# Patient Record
Sex: Female | Born: 2000 | Race: White | Hispanic: Yes | Marital: Single | Smoking: Current some day smoker
Health system: Southern US, Community
[De-identification: ages and names within clinical notes are randomized; demographics above are authoritative.]

## PROBLEM LIST (undated history)

## (undated) ENCOUNTER — Inpatient Hospital Stay: Payer: Self-pay

## (undated) DIAGNOSIS — F319 Bipolar disorder, unspecified: Secondary | ICD-10-CM

## (undated) HISTORY — PX: APPENDECTOMY: SHX54

## (undated) HISTORY — DX: Bipolar disorder, unspecified: F31.9

---
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
## 2020-01-24 ENCOUNTER — Emergency Department
Admission: EM | Admit: 2020-01-24 | Discharge: 2020-01-25 | Disposition: A | Discharge status: Home / Self Care | Payer: Medicaid Other | Attending: Emergency Medicine | Admitting: Emergency Medicine

## 2020-01-24 ENCOUNTER — Other Ambulatory Visit: Payer: Self-pay

## 2020-01-24 DIAGNOSIS — Z20822 Contact with and (suspected) exposure to covid-19: Secondary | ICD-10-CM | POA: Insufficient documentation

## 2020-01-24 DIAGNOSIS — Z046 Encounter for general psychiatric examination, requested by authority: Secondary | ICD-10-CM | POA: Diagnosis not present

## 2020-01-24 DIAGNOSIS — F1290 Cannabis use, unspecified, uncomplicated: Secondary | ICD-10-CM | POA: Insufficient documentation

## 2020-01-24 DIAGNOSIS — F29 Unspecified psychosis not due to a substance or known physiological condition: Secondary | ICD-10-CM | POA: Insufficient documentation

## 2020-01-24 DIAGNOSIS — F22 Delusional disorders: Secondary | ICD-10-CM | POA: Diagnosis present

## 2020-01-24 LAB — CBC
HCT: 46.1 % — ABNORMAL HIGH (ref 36.0–46.0)
Hemoglobin: 16.2 g/dL — ABNORMAL HIGH (ref 12.0–15.0)
MCH: 31.3 pg (ref 26.0–34.0)
MCHC: 35.1 g/dL (ref 30.0–36.0)
MCV: 89 fL (ref 80.0–100.0)
Platelets: 355 10*3/uL (ref 150–400)
RBC: 5.18 MIL/uL — ABNORMAL HIGH (ref 3.87–5.11)
RDW: 12.5 % (ref 11.5–15.5)
WBC: 11.4 10*3/uL — ABNORMAL HIGH (ref 4.0–10.5)
nRBC: 0 % (ref 0.0–0.2)

## 2020-01-24 LAB — COMPREHENSIVE METABOLIC PANEL
ALT: 28 U/L (ref 0–44)
AST: 27 U/L (ref 15–41)
Albumin: 5.3 g/dL — ABNORMAL HIGH (ref 3.5–5.0)
Alkaline Phosphatase: 78 U/L (ref 38–126)
Anion gap: 14 (ref 5–15)
BUN: 10 mg/dL (ref 6–20)
CO2: 20 mmol/L — ABNORMAL LOW (ref 22–32)
Calcium: 9.5 mg/dL (ref 8.9–10.3)
Chloride: 105 mmol/L (ref 98–111)
Creatinine, Ser: 0.69 mg/dL (ref 0.44–1.00)
GFR calc Af Amer: >60 mL/min (ref >60)
GFR calc non Af Amer: >60 mL/min (ref >60)
Glucose, Bld: 95 mg/dL (ref 70–99)
Potassium: 3.5 mmol/L (ref 3.5–5.1)
Sodium: 139 mmol/L (ref 135–145)
Total Bilirubin: 1.5 mg/dL — ABNORMAL HIGH (ref 0.3–1.2)
Total Protein: 8.5 g/dL — ABNORMAL HIGH (ref 6.5–8.1)

## 2020-01-24 LAB — SALICYLATE LEVEL: Salicylate Lvl: <7 mg/dL — ABNORMAL LOW (ref 7.0–30.0)

## 2020-01-24 LAB — ACETAMINOPHEN LEVEL: Acetaminophen (Tylenol), Serum: <10 ug/mL — ABNORMAL LOW (ref 10–30)

## 2020-01-24 LAB — ETHANOL: Alcohol, Ethyl (B): <10 mg/dL (ref <10)
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
## 2020-01-24 NOTE — ED Notes (Signed)
Patient changed into hospital provided scrubs by this RN and by Ian Malkin NT. Patient's belonging's placed into labeled bag. Patient's belonging's include:  Green pull over jacket, blue shirt, grey pants, pink shorts, grey panties, green bra, black shoes, black socks, green hair tie.

## 2020-01-25 ENCOUNTER — Emergency Department: Payer: Medicaid Other

## 2020-01-25 LAB — RESPIRATORY PANEL BY RT PCR (FLU A&B, COVID)
Influenza A by PCR: NEGATIVE
Influenza B by PCR: NEGATIVE
SARS Coronavirus 2 by RT PCR: NEGATIVE

## 2020-01-25 LAB — URINE DRUG SCREEN, QUALITATIVE (ARMC ONLY)
Amphetamines, Ur Screen: NOT DETECTED
Barbiturates, Ur Screen: NOT DETECTED
Benzodiazepine, Ur Scrn: NOT DETECTED
Cannabinoid 50 Ng, Ur ~~LOC~~: POSITIVE — AB
Cocaine Metabolite,Ur ~~LOC~~: NOT DETECTED
MDMA (Ecstasy)Ur Screen: NOT DETECTED
Methadone Scn, Ur: NOT DETECTED
Opiate, Ur Screen: NOT DETECTED
Phencyclidine (PCP) Ur S: NOT DETECTED
Tricyclic, Ur Screen: NOT DETECTED

## 2020-01-25 LAB — POCT PREGNANCY, URINE: Preg Test, Ur: NEGATIVE

## 2020-01-25 MED ORDER — LORAZEPAM 0.5 MG PO TABS
0.5000 mg | ORAL_TABLET | Freq: Once | ORAL | Status: AC
  Administered 2020-01-25: 0.5 mg via ORAL
  Filled 2020-01-25: qty 1

## 2020-01-25 NOTE — BH Assessment (Signed)
Patient already seen at [facility] and psychiatric inpatient was already determined. [facility]

## 2020-01-25 NOTE — ED Notes (Signed)
Pt ambulatory to toilet and given water

## 2020-01-25 NOTE — ED Notes (Signed)
Patient has been accepted to [facility].  Patient assigned to [facility] Unit Accepting physician is Dr. Betti Cruz.  Call report to [phone].  Representative was [place].   ER Staff is aware of it:  [facility] ER Secretary  Dr. Dolores Frame, ER MD  Danelle Earthly Patient's Nurse

## 2020-01-25 NOTE — ED Notes (Signed)
Pt's belongings sent with [facility] for transport to [facility].

## 2020-01-25 NOTE — ED Notes (Addendum)
Pt verbalized understanding of need to transfer to [facility] for continued care. Pt is NAD at this time. Pt transported from [facility] by [facility] department. RN unable to obtain signature for transfer.

## 2020-01-25 NOTE — ED Notes (Signed)
This RN attempted to contact Pt's Aunt to inform her of pt's transfer. RN was unsuccessful.

## 2020-01-25 NOTE — ED Notes (Addendum)
Pt found in room att reports confusion and not knowing "what's going on" denies confrontation with mother  Pt denies SI/HI but reports hearing voices - "you know them" this writer reports that is unknown and that seems to agitate pt, exasperated sigh and falls back onto bed  Pt IVC'd
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
## 2020-01-27 ENCOUNTER — Ambulatory Visit: Payer: Self-pay

## 2020-02-15 ENCOUNTER — Emergency Department
Admission: EM | Admit: 2020-02-15 | Discharge: 2020-02-16 | Disposition: A | Discharge status: Other Acute Inpatient Hospital | Payer: Medicaid Other | Attending: Emergency Medicine | Admitting: Emergency Medicine

## 2020-02-15 ENCOUNTER — Other Ambulatory Visit: Payer: Self-pay

## 2020-02-15 ENCOUNTER — Encounter: Payer: Self-pay | Admitting: Emergency Medicine

## 2020-02-15 DIAGNOSIS — F419 Anxiety disorder, unspecified: Secondary | ICD-10-CM | POA: Diagnosis not present

## 2020-02-15 DIAGNOSIS — Z20822 Contact with and (suspected) exposure to covid-19: Secondary | ICD-10-CM | POA: Diagnosis not present

## 2020-02-15 DIAGNOSIS — F17200 Nicotine dependence, unspecified, uncomplicated: Secondary | ICD-10-CM | POA: Diagnosis not present

## 2020-02-15 DIAGNOSIS — F32A Depression, unspecified: Secondary | ICD-10-CM

## 2020-02-15 DIAGNOSIS — R45851 Suicidal ideations: Secondary | ICD-10-CM | POA: Insufficient documentation

## 2020-02-15 DIAGNOSIS — F329 Major depressive disorder, single episode, unspecified: Secondary | ICD-10-CM | POA: Diagnosis present

## 2020-02-15 DIAGNOSIS — F259 Schizoaffective disorder, unspecified: Secondary | ICD-10-CM

## 2020-02-15 DIAGNOSIS — F250 Schizoaffective disorder, bipolar type: Secondary | ICD-10-CM

## 2020-02-15 LAB — ACETAMINOPHEN LEVEL: Acetaminophen (Tylenol), Serum: <10 ug/mL — ABNORMAL LOW (ref 10–30)

## 2020-02-15 LAB — CBC
HCT: 46.5 % — ABNORMAL HIGH (ref 36.0–46.0)
Hemoglobin: 15.9 g/dL — ABNORMAL HIGH (ref 12.0–15.0)
MCH: 31.5 pg (ref 26.0–34.0)
MCHC: 34.2 g/dL (ref 30.0–36.0)
MCV: 92.1 fL (ref 80.0–100.0)
Platelets: 316 10*3/uL (ref 150–400)
RBC: 5.05 MIL/uL (ref 3.87–5.11)
RDW: 12.7 % (ref 11.5–15.5)
WBC: 14.3 10*3/uL — ABNORMAL HIGH (ref 4.0–10.5)
nRBC: 0 % (ref 0.0–0.2)

## 2020-02-15 LAB — COMPREHENSIVE METABOLIC PANEL
ALT: 41 U/L (ref 0–44)
AST: 36 U/L (ref 15–41)
Albumin: 4.5 g/dL (ref 3.5–5.0)
Alkaline Phosphatase: 91 U/L (ref 38–126)
Anion gap: 10 (ref 5–15)
BUN: 11 mg/dL (ref 6–20)
CO2: 19 mmol/L — ABNORMAL LOW (ref 22–32)
Calcium: 9.4 mg/dL (ref 8.9–10.3)
Chloride: 109 mmol/L (ref 98–111)
Creatinine, Ser: 0.72 mg/dL (ref 0.44–1.00)
GFR calc Af Amer: >60 mL/min (ref >60)
GFR calc non Af Amer: >60 mL/min (ref >60)
Glucose, Bld: 147 mg/dL — ABNORMAL HIGH (ref 70–99)
Potassium: 4.5 mmol/L (ref 3.5–5.1)
Sodium: 138 mmol/L (ref 135–145)
Total Bilirubin: 2 mg/dL — ABNORMAL HIGH (ref 0.3–1.2)
Total Protein: 8.1 g/dL (ref 6.5–8.1)

## 2020-02-15 LAB — URINE DRUG SCREEN, QUALITATIVE (ARMC ONLY)
Amphetamines, Ur Screen: NOT DETECTED
Barbiturates, Ur Screen: NOT DETECTED
Benzodiazepine, Ur Scrn: POSITIVE — AB
Cannabinoid 50 Ng, Ur ~~LOC~~: POSITIVE — AB
Cocaine Metabolite,Ur ~~LOC~~: NOT DETECTED
MDMA (Ecstasy)Ur Screen: NOT DETECTED
Methadone Scn, Ur: NOT DETECTED
Opiate, Ur Screen: NOT DETECTED
Phencyclidine (PCP) Ur S: NOT DETECTED
Tricyclic, Ur Screen: NOT DETECTED

## 2020-02-15 LAB — SALICYLATE LEVEL: Salicylate Lvl: <7 mg/dL — ABNORMAL LOW (ref 7.0–30.0)

## 2020-02-15 LAB — ETHANOL: Alcohol, Ethyl (B): <10 mg/dL (ref <10)

## 2020-02-15 LAB — SARS CORONAVIRUS 2 BY RT PCR (HOSPITAL ORDER, PERFORMED IN ~~LOC~~ HOSPITAL LAB): SARS Coronavirus 2: NEGATIVE

## 2020-02-15 LAB — POCT PREGNANCY, URINE: Preg Test, Ur: NEGATIVE

## 2020-02-15 MED ORDER — LORAZEPAM 1 MG PO TABS
1.0000 mg | ORAL_TABLET | ORAL | Status: DC | PRN
  Filled 2020-02-15: qty 1

## 2020-02-15 MED ORDER — LORAZEPAM 1 MG PO TABS
1.0000 mg | ORAL_TABLET | Freq: Once | ORAL | Status: AC
  Administered 2020-02-15: 1 mg via ORAL
  Filled 2020-02-15: qty 1
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
## 2020-02-15 NOTE — ED Notes (Signed)
Patient dressed out by this RN and Shanda Bumps, RN in hospital provided scrubs. Pt belongings placed in belongings bag and labeled with pt label and green tag. Pt had with them the following belongings:   1 pair of black tennis shoes  1 pair of gray socks 1 pair of black legging pants  1 black t-shirt 1 pink and blue hair tie 1 white sports bra 1 pair of gray underwear  Pt belongings taken with patient to 25h. Pt calm and cooperative.
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
## 2020-02-15 NOTE — ED Provider Notes (Signed)
-----------------------------------------   11:28 PM on 02/15/2020 -----------------------------------------  Talked in person with Jerilynn Som (TTS) who said the patient will be admitted to behavioral medicine service.   Loleta Rose, MD 02/15/20 2328

## 2020-02-15 NOTE — ED Notes (Signed)
Pt reports coming to the ER for shaking. Pt denies SI/HI at this time

## 2020-02-15 NOTE — ED Notes (Signed)
Patient is drowsy, cooperative and calm. Room darkened for comfort.

## 2020-02-15 NOTE — ED Notes (Signed)
Will defer psychiatric assessment until patient is more coherent. Patient remains alert, restlessness is less, but still unable to give appropriate answers.

## 2020-02-15 NOTE — ED Triage Notes (Signed)
Pt to ED via POV by Uncle for psych eval. Per pt she was brought in because she was "shaking a lot". Pt has very flat affect in triage. Pt admits to SI, pt denies HI. Pt states that she is under a lot of stress. Pt is calm and cooperative at this time. Pt tachycardic at 143, denies ingestion of anything other than her routine medications.

## 2020-02-15 NOTE — ED Notes (Signed)
Pt shaking, restless, moving all around in recliner.  Pt unable to give appropriate answers to questions.

## 2020-02-15 NOTE — ED Notes (Signed)
Report given to Ashton RN

## 2020-02-15 NOTE — BH Assessment (Signed)
Assessment Note  Madison Boyer is an 19 y.o. female who presents to the ER, after she was briefly seen at [facility] Per the patient's uncle ([phone]), today, she was irritable, impulsive and irrational. She was standing in traffic yelling at cars and trying to stop them. This is the first time he has seen her like this. Approximately a month ago, her behaviors began to change. She was reporting HI and paranoid at that time and required inpatient treatment. She was admitted at [facility] (01/25/2020-02/05/2020). Per the uncle the patient was prescribed two medications but he doesn't know what it is and if she's taking it.  Patient lives with her parents and three younger siblings. The uncle is the main contact because they do not speak english and that's why he was the one who brought her to the ER. The family was concerned she was going to get hit by a car or someone was going to hurt her because of the way she was behaving.  During the interview the patient attempted to participate. She was liable and at times had a flight of ideas. She was easily redirectable and able to complete majority of her thoughts. She admits to the use of cannabis and alcohol and denies the use of any other mind-altering substances. Her UDS reflected the same, she's positive for cannabis and BAC was negative.  Diagnosis: Unspecified Psychosis  Past Medical History: History reviewed. No pertinent past medical history.  Past Surgical History:  Procedure Laterality Date  . APPENDECTOMY      Family History:  Family History  Problem Relation Age of Onset  . Healthy Mother   . Healthy Father   . Healthy Maternal Grandmother   . Diabetes Maternal Grandfather   . Healthy Paternal Grandmother   . Healthy Paternal Grandfather     Social History:  reports that she has been smoking. She has never used smokeless tobacco. She reports current alcohol use. She reports current drug use. Drug: Marijuana.  Additional Social  History:  Alcohol / Drug Use Pain Medications: See PTA Prescriptions: See PTA Over the Counter: See PTA History of alcohol / drug use?: No history of alcohol / drug abuse Longest period of sobriety (when/how long): Unable to quantify  CIWA: CIWA-Ar BP: 129/67 Pulse Rate: (!) 137 COWS:    Allergies: No Known Allergies  Home Medications: (Not in a hospital admission)   OB/GYN Status:  No LMP recorded.  General Assessment Data Location of Assessment: [facility] ED TTS Assessment: In system Is this a Tele or Face-to-Face Assessment?: Face-to-Face Is this an Initial Assessment or a Re-assessment for this encounter?: Initial Assessment Patient Accompanied by:: Other(Uncle) Language Other than English: No Living Arrangements: Other (Comment)(Private Home) What gender do you identify as?: Female Marital status: Single Pregnancy Status: No Living Arrangements: Parent, Other relatives Can pt return to current living arrangement?: Yes Admission Status: Involuntary Petitioner: ED Attending Is patient capable of signing voluntary admission?: No(Under IVC) Referral Source: Self/Family/Friend Insurance type: None  Medical Screening Exam [facility] Walk-in ONLY) Medical Exam completed: Yes  Crisis Care Plan Living Arrangements: Parent, Other relatives Legal Guardian: Other:(Self) Name of Psychiatrist: [facility] Name of Therapist: [facility]  Education Status Is patient currently in school?: No Is the patient employed, unemployed or receiving disability?: Unemployed  Risk to self with the past 6 months Suicidal Ideation: No Has patient been a risk to self within the past 6 months prior to admission? : No Suicidal Intent: No Has patient had any suicidal intent within the  past 6 months prior to admission? : No Is patient at risk for suicide?: No Suicidal Plan?: No Has patient had any suicidal plan within the past 6 months prior to admission? : No Access to Means: No What has been your use of  drugs/alcohol within the last 12 months?: Unknown Previous Attempts/Gestures: No How many times?: 0 Other Self Harm Risks: Reports of none Triggers for Past Attempts: None known Intentional Self Injurious Behavior: None Family Suicide History: Unknown Recent stressful life event(s): Other (Comment)(Changes in mood and thought process) Persecutory voices/beliefs?: No Depression: Yes Depression Symptoms: Feeling angry/irritable, Tearfulness, Insomnia, Feeling worthless/self pity Substance abuse history and/or treatment for substance abuse?: No Suicide prevention information given to non-admitted patients: Not applicable  Risk to Others within the past 6 months Homicidal Ideation: No Does patient have any lifetime risk of violence toward others beyond the six months prior to admission? : No Thoughts of Harm to Others: No Current Homicidal Intent: No Current Homicidal Plan: No Access to Homicidal Means: No Identified Victim: Reports of none History of harm to others?: No Assessment of Violence: None Noted Violent Behavior Description: Reports of none Does patient have access to weapons?: No Criminal Charges Pending?: No Does patient have a court date: No Is patient on probation?: No  Psychosis Hallucinations: None noted Delusions: Persecutory, Unspecified  Mental Status Report Appearance/Hygiene: Unremarkable, In scrubs Eye Contact: Good Motor Activity: Freedom of movement, Unremarkable Speech: Soft, Unremarkable, Logical/coherent Level of Consciousness: Alert, Restless Mood: Anxious, Ambivalent, Sad, Pleasant(Tearful) Affect: Anxious, Appropriate to circumstance, Labile Anxiety Level: Moderate Thought Processes: Circumstantial, Flight of Ideas, Relevant Judgement: Partial Orientation: Person, Place, Appropriate for developmental age Obsessive Compulsive Thoughts/Behaviors: Minimal  Cognitive Functioning Concentration: Decreased Memory: Recent Impaired, Remote Intact Is  patient IDD: No Insight: Poor Impulse Control: Poor Appetite: Good Have you had any weight changes? : No Change Sleep: Decreased Total Hours of Sleep: 0 Vegetative Symptoms: None  ADLScreening [facility] Assessment Services) Patient's cognitive ability adequate to safely complete daily activities?: Yes Patient able to express need for assistance with ADLs?: Yes Independently performs ADLs?: Yes (appropriate for developmental age)  Prior Inpatient Therapy Prior Inpatient Therapy: Yes Prior Therapy Dates: 01/2020 Prior Therapy Facilty/Provider(s): [facility] [facility] Reason for Treatment: Psychosis  Prior Outpatient Therapy Prior Outpatient Therapy: Yes Prior Therapy Dates: Current Prior Therapy Facilty/Provider(s): [facility] Reason for Treatment: Psychosis Does patient have an ACCT team?: No Does patient have Intensive In-House Services?  : No Does patient have [facility] services? : No Does patient have P4CC services?: No  ADL Screening (condition at time of admission) Patient's cognitive ability adequate to safely complete daily activities?: Yes Is the patient deaf or have difficulty hearing?: No Does the patient have difficulty seeing, even when wearing glasses/contacts?: No Does the patient have difficulty concentrating, remembering, or making decisions?: No Patient able to express need for assistance with ADLs?: Yes Does the patient have difficulty dressing or bathing?: No Independently performs ADLs?: Yes (appropriate for developmental age) Does the patient have difficulty walking or climbing stairs?: No Weakness of Legs: None Weakness of Arms/Hands: None  Home Assistive Devices/Equipment Home Assistive Devices/Equipment: None  Therapy Consults (therapy consults require a physician order) PT Evaluation Needed: No OT Evalulation Needed: No SLP Evaluation Needed: No Abuse/Neglect Assessment (Assessment to be complete while patient is alone) Abuse/Neglect Assessment Can Be Completed:  Yes Physical Abuse: Denies Verbal Abuse: Denies Sexual Abuse: Denies Exploitation of patient/patient's resources: Denies Self-Neglect: Denies Values / Beliefs Cultural Requests During Hospitalization: None Spiritual Requests During Hospitalization: None  Consults Spiritual Care Consult Needed: No Transition of Care Team Consult Needed: No Advance Directives (For Healthcare) Does Patient Have a Medical Advance Directive?: No Would patient like information on creating a medical advance directive?: No - Patient declined  Child/Adolescent Assessment Running Away Risk: Denies(Patient is an adult)  Disposition:  Disposition Initial Assessment Completed for this Encounter: Yes  On Site Evaluation by:   Reviewed with Physician:    Lilyan Gilford MS, LCAS, [facility], [facility] Therapeutic Triage Specialist 02/15/2020 9:05 PM

## 2020-02-15 NOTE — ED Notes (Addendum)
Patient given ED sandwich tray and ginger ale. Patient is oriented to self, but still cannot stay focus on questions. Patient denies SI/HI.

## 2020-02-15 NOTE — ED Provider Notes (Signed)
[facility] EMERGENCY DEPARTMENT Provider Note   CSN: [id number] Arrival date & time: 02/15/20  1621     History Chief Complaint  Patient presents with  . Psychiatric Evaluation    Madison Boyer is a 19 y.o. female here presenting with depression and suicidal ideation.  Patient states that she just wants to be left alone.  She apparently told her family that she wanted to kill herself.  Her uncle then brought her in for evaluation.  Apparently patient went to [facility] and try to open the car door and jump out of a moving vehicle.  Patient states that she does not remember any of that.  She appears very tearful but states that she cannot remember what she told her parents.   The history is provided by the patient.       History reviewed. No pertinent past medical history.  Patient Active Problem List   Diagnosis Date Noted  . Schizoaffective disorder (HCC) 02/15/2020  . Dysmenorrhea     Past Surgical History:  Procedure Laterality Date  . APPENDECTOMY       OB History    Gravida  0   Para  0   Term  0   Preterm  0   AB  0   Living  0     SAB  0   TAB  0   Ectopic  0   Multiple  0   Live Births  0           Family History  Problem Relation Age of Onset  . Healthy Mother   . Healthy Father   . Healthy Maternal Grandmother   . Diabetes Maternal Grandfather   . Healthy Paternal Grandmother   . Healthy Paternal Grandfather     Social History   Tobacco Use  . Smoking status: Current Some Day Smoker  . Smokeless tobacco: Never Used  Substance Use Topics  . Alcohol use: Yes    Comment: occass  . Drug use: Yes    Types: Marijuana    Home Medications Prior to Admission medications   Medication Sig Start Date End Date Taking? Authorizing Provider  norgestimate-ethinyl estradiol (ORTHO-CYCLEN) 0.25-35 MG-MCG tablet Take 1 tablet by mouth daily. 12/23/19   Gunnar Bulla, CNM    Allergies    Patient has no known  allergies.  Review of Systems   Review of Systems  Psychiatric/Behavioral: Positive for confusion and dysphoric mood.  All other systems reviewed and are negative.   Physical Exam Updated Vital Signs BP 134/78 (BP Location: Left Arm)   Pulse (!) 113   Temp 98.2 F (36.8 C) (Oral)   Resp 14   Ht 5\' 2"  (1.575 m)   Wt 81.6 kg   SpO2 98%   BMI 32.92 kg/m   Physical Exam Vitals and nursing note reviewed.  Constitutional:      Comments: Tearful   HENT:     Head: Normocephalic.     Nose: Nose normal.     Mouth/Throat:     Mouth: Mucous membranes are moist.  Eyes:     Extraocular Movements: Extraocular movements intact.     Pupils: Pupils are equal, round, and reactive to light.  Cardiovascular:     Rate and Rhythm: Normal rate and regular rhythm.     Pulses: Normal pulses.     Heart sounds: Normal heart sounds.  Pulmonary:     Effort: Pulmonary effort is normal.     Breath sounds: Normal  breath sounds.  Abdominal:     General: Abdomen is flat.     Palpations: Abdomen is soft.  Musculoskeletal:        General: Normal range of motion.     Cervical back: Normal range of motion.  Skin:    General: Skin is warm.     Capillary Refill: Capillary refill takes less than 2 seconds.  Neurological:     General: No focal deficit present.  Psychiatric:     Comments: Depressed, tearful      ED Results / Procedures / Treatments   Labs (all labs ordered are listed, but only abnormal results are displayed) Labs Reviewed  COMPREHENSIVE METABOLIC PANEL - Abnormal; Notable for the following components:      Result Value   CO2 19 (*)    Glucose, Bld 147 (*)    Total Bilirubin 2.0 (*)    All other components within normal limits  SALICYLATE LEVEL - Abnormal; Notable for the following components:   Salicylate Lvl <7.0 (*)    All other components within normal limits  ACETAMINOPHEN LEVEL - Abnormal; Notable for the following components:   Acetaminophen (Tylenol), Serum <10 (*)     All other components within normal limits  CBC - Abnormal; Notable for the following components:   WBC 14.3 (*)    Hemoglobin 15.9 (*)    HCT 46.5 (*)    All other components within normal limits  URINE DRUG SCREEN, QUALITATIVE ([facility] ONLY) - Abnormal; Notable for the following components:   Cannabinoid 50 Ng, Ur [place] POSITIVE (*)    Benzodiazepine, Ur Scrn POSITIVE (*)    All other components within normal limits  SARS CORONAVIRUS 2 BY RT PCR (HOSPITAL ORDER, PERFORMED IN [facility])  ETHANOL  POC URINE PREG, ED  POCT PREGNANCY, URINE  POC URINE PREG, ED    EKG None  Radiology No results found.  Procedures Procedures (including critical care time)  Medications Ordered in ED Medications  LORazepam (ATIVAN) tablet 1 mg (has no administration in time range)  LORazepam (ATIVAN) tablet 1 mg (1 mg Oral Given 02/15/20 1722)    ED Course  I have reviewed the triage vital signs and the nursing notes.  Pertinent labs & imaging results that were available during my care of the patient were reviewed by me and considered in my medical decision making (see chart for details).    MDM Rules/Calculators/A&P                      Madison Boyer is a 19 y.o. female here presenting with some forgetfulness and suicidal ideation.  Patient apparently tried to jump out of a moving car but she did not remember that.  She is very tearful.  She is here voluntarily.  She is also tachycardic when she is very anxious.  We will give some Ativan and check labs and UDS.   11:45 PM UDS positive for marijuana and benzos.  However she received Ativan in the ED.  Her heart rate is down to 110 after getting Ativan.  Psych plans to admit.  The patient has been placed in psychiatric observation due to the need to provide a safe environment for the patient while obtaining psychiatric consultation and evaluation, as well as ongoing medical and medication management to treat the patient's condition.   The patient has been placed under full IVC at this time.    Final Clinical Impression(s) / ED Diagnoses Final diagnoses:  Depression,  unspecified depression type    Rx / DC Orders ED Discharge Orders    None       Drenda Freeze, MD 02/15/20 2345

## 2020-02-16 ENCOUNTER — Encounter: Payer: Self-pay | Admitting: Psychiatric/Mental Health

## 2020-02-16 ENCOUNTER — Inpatient Hospital Stay
Admit: 2020-02-16 | Discharge: 2020-02-25 | DRG: 885 | Disposition: A | Discharge status: Home / Self Care | Payer: Medicaid Other | Source: Intra-hospital | Attending: Psychiatry | Admitting: Psychiatry

## 2020-02-16 DIAGNOSIS — F19959 Other psychoactive substance use, unspecified with psychoactive substance-induced psychotic disorder, unspecified: Secondary | ICD-10-CM

## 2020-02-16 DIAGNOSIS — F259 Schizoaffective disorder, unspecified: Principal | ICD-10-CM | POA: Diagnosis present

## 2020-02-16 DIAGNOSIS — Z20822 Contact with and (suspected) exposure to covid-19: Secondary | ICD-10-CM | POA: Diagnosis present

## 2020-02-16 DIAGNOSIS — F1210 Cannabis abuse, uncomplicated: Secondary | ICD-10-CM | POA: Diagnosis present

## 2020-02-16 DIAGNOSIS — F1010 Alcohol abuse, uncomplicated: Secondary | ICD-10-CM

## 2020-02-16 DIAGNOSIS — N946 Dysmenorrhea, unspecified: Secondary | ICD-10-CM | POA: Diagnosis present

## 2020-02-16 DIAGNOSIS — Z9119 Patient's noncompliance with other medical treatment and regimen: Secondary | ICD-10-CM | POA: Diagnosis not present

## 2020-02-16 DIAGNOSIS — F17200 Nicotine dependence, unspecified, uncomplicated: Secondary | ICD-10-CM | POA: Diagnosis present

## 2020-02-16 MED ORDER — LORAZEPAM 2 MG PO TABS
2.0000 mg | ORAL_TABLET | Freq: Once | ORAL | Status: AC
  Administered 2020-02-16: 2 mg via ORAL
  Filled 2020-02-16: qty 1

## 2020-02-16 MED ORDER — BENZTROPINE MESYLATE 1 MG PO TABS: 2.0000 mg | ORAL_TABLET | Freq: Two times a day (BID) | ORAL | Status: DC | PRN

## 2020-02-16 MED ORDER — BENZTROPINE MESYLATE 1 MG/ML IJ SOLN
2.0000 mg | Freq: Two times a day (BID) | INTRAMUSCULAR | Status: DC | PRN
  Filled 2020-02-16: qty 2

## 2020-02-16 MED ORDER — ACETAMINOPHEN 325 MG PO TABS
650.0000 mg | ORAL_TABLET | Freq: Four times a day (QID) | ORAL | Status: DC | PRN
  Administered 2020-02-18 – 2020-02-19 (×2): 650 mg via ORAL
  Filled 2020-02-16 (×2): qty 2

## 2020-02-16 MED ORDER — ALUM & MAG HYDROXIDE-SIMETH 200-200-20 MG/5ML PO SUSP: 30.0000 mL | ORAL | Status: DC | PRN

## 2020-02-16 MED ORDER — MAGNESIUM HYDROXIDE 400 MG/5ML PO SUSP: 30.0000 mL | Freq: Every day | ORAL | Status: DC | PRN

## 2020-02-16 MED ORDER — RISPERIDONE 1 MG PO TABS
2.0000 mg | ORAL_TABLET | Freq: Two times a day (BID) | ORAL | Status: DC
  Administered 2020-02-16 – 2020-02-19 (×7): 2 mg via ORAL
  Filled 2020-02-16 (×7): qty 2

## 2020-02-16 MED ORDER — HYDROXYZINE HCL 25 MG PO TABS
25.0000 mg | ORAL_TABLET | Freq: Three times a day (TID) | ORAL | Status: DC | PRN
  Administered 2020-02-16 – 2020-02-23 (×13): 25 mg via ORAL
  Filled 2020-02-16 (×13): qty 1

## 2020-02-16 MED ORDER — TRAZODONE HCL 50 MG PO TABS
50.0000 mg | ORAL_TABLET | Freq: Every evening | ORAL | Status: DC | PRN
  Administered 2020-02-16 – 2020-02-23 (×6): 50 mg via ORAL
  Filled 2020-02-16 (×7): qty 1

## 2020-02-16 NOTE — BH Assessment (Signed)
Patient is to be admitted to [facility] by Psychiatric Nurse Practitioner Rashaun D.  Attending Physician will be Dr. Toni Amend.   Patient has been assigned to room 313, by [facility] Charge Nurse Britta Mccreedy.   ER staff is aware of the admission:  Dr. York Cerise, ER MD   Phineas Semen, Patient's Nurse   Leeroy Bock, Patient Access.

## 2020-02-16 NOTE — BHH Counselor (Signed)
CSW attempted to complete PSA, patient was unable to answer any questions, disorganized, and appeared to be responding to internal stimuli. CSW tried contacting patient's father- Karl Pock, no answer and no option to leave a message.    Betrice Wanat  CUEBAS-COLON, LCSW 02/16/2020

## 2020-02-16 NOTE — Progress Notes (Signed)
Madison Boyer is a 19 y.o. female patient transferred to the BMU from the ED. Pt's primary complaint is that she's been shaking and doesn't know why. While going over her admission paperwork and consent forms, pt was very irritable and agitated. She said that I already know all of this. She was upset when her old ID bracelet was removed and her new one was applied even though the reasoning was provided. Her affect was very labile. She was shaking intermittently during the admission process. Her skin assessment was completed and her skin is intact. Free of scars, bruises, wounds, and tattoos. Pt was very guarded during the admission process and provided brief answers to questions. Pt was oriented to the unit and provided dinner. Pt denies SI, but is positive for HI. She said she was having thoughts of hurting her friends that say bad things to her and in turn punching them. Active listening, reassurance, and support were provided. Q 15 minute safety checks continue and patient remains safe on unit.

## 2020-02-16 NOTE — BHH Group Notes (Signed)
LCSW Group Therapy Note 02/16/2020 1:15pm  Type of Therapy and Topic: Group Therapy: Feelings Around Returning Home & Establishing a Supportive Framework and Supporting Oneself When Supports Not Available  Participation Level: Did Not Attend  Description of Group:  Patients first processed thoughts and feelings about upcoming discharge. These included fears of upcoming changes, lack of change, new living environments, judgements and expectations from others and overall stigma of mental health issues. The group then discussed the definition of a supportive framework, what that looks and feels like, and how do to discern it from an unhealthy non-supportive network. The group identified different types of supports as well as what to do when your family/friends are less than helpful or unavailable  Therapeutic Goals  1. Patient will identify one healthy supportive network that they can use at discharge. 2. Patient will identify one factor of a supportive framework and how to tell it from an unhealthy network. 3. Patient able to identify one coping skill to use when they do not have positive supports from others. 4. Patient will demonstrate ability to communicate their needs through discussion and/or role plays.  Summary of Patient Progress:  Patient did not attend group.  Therapeutic Modalities Cognitive Behavioral Therapy Motivational Interviewing   Olena Willy  CUEBAS-COLON, LCSW 02/16/2020 12:31 PM 
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
## 2020-02-16 NOTE — ED Notes (Signed)
Pt to be taken down to psych room 313. Pt to be escorted with RN and hospital security. 1 of 1 pt belonging bags to be taken down by RN and handed off to unit staff.

## 2020-02-16 NOTE — BH Assessment (Signed)
[facility] Group Notes:  (Nursing/MHT/Case Management/Adjunct)  Date:  02/16/2020  Time:  9:21 PM  Type of Therapy:  Group Therapy  Participation Level:  Active  Participation Quality:  Appropriate  Affect:  Appropriate  Cognitive:  Alert  Insight:  Good  Engagement in Group:  Engaged  Modes of Intervention:  Support  Summary of Progress/Problems:  Madison Boyer 02/16/2020, 9:21 PM

## 2020-02-16 NOTE — Tx Team (Signed)
Initial Treatment Plan 02/16/2020 3:53 AM Madison Boyer [id number]    PATIENT STRESSORS: Medication change or noncompliance Substance abuse (Marijuana use)   PATIENT STRENGTHS: Supportive family/friends   PATIENT IDENTIFIED PROBLEMS: "shaking"  anxiety  paranoia   Mood instability               DISCHARGE CRITERIA:  Improved stabilization in mood, thinking, and/or behavior Motivation to continue treatment in a less acute level of care Verbal commitment to aftercare and medication compliance  PRELIMINARY DISCHARGE PLAN: Attend aftercare/continuing care group Outpatient therapy Return to previous living arrangement  PATIENT/FAMILY INVOLVEMENT: This treatment plan has been presented to and reviewed with the patient, Madison Boyer.  The patient and family have been given the opportunity to ask questions and make suggestions.  Ephraim Hamburger, RN 02/16/2020, 3:53 AM

## 2020-02-16 NOTE — Progress Notes (Signed)
Pt came to the nurses station asking for some water. Pt was shown the day room where she could obtain some water as needed. When pt returned to the nurses station, she asked where her room was. Pt was provided directions on how to get back to her room, but displayed an unstable mood. Pt responded with "Show me my room." Pt displays child-like behavior and was seen skipping down the hallway. Pt was redirected to her room.

## 2020-02-16 NOTE — Progress Notes (Signed)
Pt is alert and oriented to name, thoughts are disorganized, unable to answers questions appropriately, displays bizarre posturing, such as shaking and shrugging her shoulders arms and hands. Thought blocking noted, pt stares inappropriately, appears to be responding to internal stimuli. Pt paces around the unit, is hypoverbal, medication compliant, offered and accepted PRN medication for anxiety, is out for meals. Will continue to monitor pt per Q15 minute face checks and monitor for safety and progress.

## 2020-02-16 NOTE — Progress Notes (Signed)
Pt has been pacing up and down the hallway. Her arms have been shaking intermittently. Intermittently she starts skipping or making dancing like motions.
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
## 2020-02-17 MED ORDER — LORAZEPAM 1 MG PO TABS
1.0000 mg | ORAL_TABLET | Freq: Once | ORAL | Status: AC
  Administered 2020-02-17: 1 mg via ORAL
  Filled 2020-02-17: qty 1

## 2020-02-17 MED ORDER — BENZTROPINE MESYLATE 1 MG PO TABS
0.5000 mg | ORAL_TABLET | Freq: Two times a day (BID) | ORAL | Status: DC
  Administered 2020-02-17 – 2020-02-25 (×16): 0.5 mg via ORAL
  Filled 2020-02-17 (×16): qty 1

## 2020-02-17 NOTE — [facility] Suicide Risk Assessment (Signed)
[facility] INPATIENT:  Family/Significant Other Suicide Prevention Education  Suicide Prevention Education:  Contact Attempts: Karl Pock, father [phone] has been identified by the patient as the family member/significant other with whom the patient will be residing, and identified as the person(s) who will aid the patient in the event of a mental health crisis.  With written consent from the patient, two attempts were made to provide suicide prevention education, prior to and/or following the patient's discharge.  We were unsuccessful in providing suicide prevention education.  A suicide education pamphlet was given to the patient to share with family/significant other.  Date and time of first attempt: 02/17/20 315pm Voicemail is not set up, unable to leave confidential vm Date and time of second attempt: 2nd attempt needed  Bellarae Lizer T Tamorah Hada 02/17/2020, 3:15 PM

## 2020-02-17 NOTE — Plan of Care (Signed)
Patient denies SI,HI and AVH.Patient states " Onalee Hua thinks I am a liar,but I am not."Patient continues to be disorganized and tangential.No aggressive behaviors noted this afternoon.Compliant with medications.Appetite and energy level good.Attended groups.Support and encouragement given.

## 2020-02-17 NOTE — BHH Counselor (Signed)
Adult Comprehensive Assessment  Patient ID: Madison Boyer, female   DOB: 04/17/2001, 19 y.o.   MRN: [id number]  Information Source: Information source: Patient  Current Stressors:  Patient states their primary concerns and needs for treatment are:: "Because Ive been shaking" Patient states their goals for this hospitilization and ongoing recovery are:: "To get better" Educational / Learning stressors: None reported Employment / Job issues: Unemployed Family Relationships: No stressor reported Museum/gallery curator / Lack of resources (include bankruptcy): No income Housing / Lack of housing: Stable housing Physical health (include injuries & life threatening diseases): None reported Substance abuse: Pt reports alcohol use-"whatever's in the fridge" marijuana use-"whatever theres to hit or around" Bereavement / Loss: None reported  Living/Environment/Situation:  Living Arrangements: Parent Living conditions (as described by patient or guardian): Pt reports living with parents Who else lives in the home?: Pt, parents, three younger siblings How long has patient lived in current situation?: "All my life" What is atmosphere in current home: Comfortable  Family History:  Marital status: Single Does patient have children?: No  Childhood History:  By whom was/is the patient raised?: Both parents Description of patient's relationship with caregiver when they were a child: "Good" Patient's description of current relationship with people who raised him/her: "Good" Does patient have siblings?: Yes Number of Siblings: 3 Description of patient's current relationship with siblings: "We get along" Did patient suffer any verbal/emotional/physical/sexual abuse as a child?: No Did patient suffer from severe childhood neglect?: No Has patient ever been sexually abused/assaulted/raped as an adolescent or adult?: No Was the patient ever a victim of a crime or a disaster?: No Witnessed domestic violence?:  No Has patient been effected by domestic violence as an adult?: No  Education:  Highest grade of school patient has completed: 12th Currently a student?: No Learning disability?: No  Employment/Work Situation:   Employment situation: Unemployed Patient's job has been impacted by current illness: No What is the longest time patient has a held a job?: Pt denies having been employed Did Regulatory affairs officer Any Psychiatric Treatment/Services While in Passenger transport manager?: No Are There Guns or Other Weapons in [place]?: No Are These Psychologist, educational?: (Pt denies access)  Financial Resources:   Financial resources: Support from parents / caregiver Does patient have a Programmer, applications or guardian?: No  Alcohol/Substance Abuse:   What has been your use of drugs/alcohol within the last 12 months?: Alcohol, marijuana Alcohol/Substance Abuse Treatment Hx: Denies past history Has alcohol/substance abuse ever caused legal problems?: No  Social Support System:   Pensions consultant Support System: Manufacturing engineer System: "Parents" Type of faith/religion: Personnel officer:   Leisure and Hobbies: "Do whatever"  Strengths/Needs:   What is the patient's perception of their strengths?: "sense of humor"  Discharge Plan:   Currently receiving community mental health services: No Patient states concerns and preferences for aftercare planning are: Pt reports her PCP at [facility] prescribes her psychiatric medication Patient states they will know when they are safe and ready for discharge when: Pt states she would like to continue seeing PCP for medciation management Does patient have access to transportation?: Yes Does patient have financial barriers related to discharge medications?: Yes Patient description of barriers related to discharge medications: No insurance listed Will patient be returning to same living situation after discharge?: Yes(Lives with  parents)  Summary/Recommendations:   Summary and Recommendations (to be completed by the evaluator): Pt cried during most of the assessment and states coming into the hospital because  she was "shaking" Chart review states pt experiencing delusional and paranoid thinking. Pt reports substance use. pt denies history of trauma and abuse. Pt denies having a mental health provider and states having PCP at [facility] provide medication management.  While here, she will benefit from crisis stabilization, psychoeducation, group therapy, and discharge planning.  The recommendation at discharge is for her to continue to follow her discharge plan as arranged.  Tanashia Ciesla T Neka Bise. 02/17/2020

## 2020-02-17 NOTE — Progress Notes (Signed)
Patient is irritable on approach.States " I am stressed leave me alone." Patient is pacing in the hallway with a sad and flat affect.Patient is going to other rooms.When redirected verbally aggressive to staff & peers.PRN medications give with support and encouragement.

## 2020-02-17 NOTE — Progress Notes (Signed)
Recreation Therapy Notes  Date: 02/17/2020  Time: 9:30 am   Location: Craft room    Behavioral response: N/A   Intervention Topic: Coping Skills   Discussion/Intervention: Patient did not attend group.   Clinical Observations/Feedback:  Patient did not attend group.   Kalik Hoare LRT/CTRS        Alto Gandolfo 02/17/2020 11:09 AM

## 2020-02-17 NOTE — Progress Notes (Signed)
Pt's thoughts are disorganized, tangential, and some thought blocking is also present. Pt was greeted at the beginning of the shift while she was pacing up and down the hallway. She appeared with a sad and depressed affect. Pt was overly worried about the results of her pregnancy test and asked "Am I pregnant?" Her pregnancy test from May 15th showed that it was negative, so the patient was informed. Pt then starting saying that she doesn't want to be a bad person and a liar. She wants to get her job back at [employer]. She said she has her high school diploma, but isn't currently in college because she missed the deadline to apply. Pt denied any shaking today, but was later observed shaking her arms and shoulders intermittently. Pt was asking if her dad was here and was referring to two boys. She asked for a hug, but was educated about covid precautions. Pt's mood is labile and anxious. She's fidgety and hyperactive.She laughs at random moments in conversations. Also presents as if she's responding to some internal stimuli, uses "we" in conversations sometimes. She did go into another patients room and had to be redirected.  Noise was heard at the nurses station and pt was checked on. Pt was seen banging her door at first and then later using her pillow to hit her bed. Pt said that she was mad and when she's mad she does whatever she wants. Her PRN hydroxyzine was administered at 2226 for anxiety and she was assisted to bed. Pt didn't remain in bed to rest and started pacing again. On call NP, Rashaun Durwin Nora was updated about patients behavior. A one time order for 2 mg of Ativan po was obtained at 2310 and administered at 2333. It has been effective. Pt has been sleeping in bed since and no distress has been noted.   She denies SI and HI. She denies AVH. Medications administered as ordered by MD. No adverse effects have been noted. Active listening, reassurance, redirection, and support provided. Q 15 minute  safety checks continue. Safety has been maintained.

## 2020-02-17 NOTE — Progress Notes (Signed)
[facility] Boyer Progress Note  02/17/2020 5:39 PM Madison Boyer  MRN:  [id number] Subjective: Follow-up for this 19 year old with psychotic symptoms.  Patient interviewed chart reviewed.  Patient is a poor historian.  Presents as disorganized and confused.  Much of her speech does not make sense.  She makes odd stereotyped gestures frequently during the interview.  She does tell me that she is "crazy".  When I asked her to explain that she had a hard time articulating it but also is still saying she is hearing things.  Denies suicidal intent or plan.  Denies any current side effects to medicine. Principal Problem: Schizoaffective disorder (HCC) Diagnosis: Principal Problem:   Schizoaffective disorder (HCC) Active Problems:   Dysmenorrhea  Total Time spent with patient: 30 minutes  Past Psychiatric History: Patient has a history of prior hospitalizations as recently as a month ago.  Seems to be likely noncompliant with treatment  Past Medical History: History reviewed. No pertinent past medical history.  Past Surgical History:  Procedure Laterality Date  . APPENDECTOMY     Family History:  Family History  Problem Relation Age of Onset  . Healthy Mother   . Healthy Father   . Healthy Maternal Grandmother   . Diabetes Maternal Grandfather   . Healthy Paternal Grandmother   . Healthy Paternal Grandfather    Family Psychiatric  History: None known Social History:  Social History   Substance and Sexual Activity  Alcohol Use Yes   Comment: occassionally     Social History   Substance and Sexual Activity  Drug Use Yes  . Types: Marijuana    Social History   Socioeconomic History  . Marital status: Single    Spouse name: Not on file  . Number of children: Not on file  . Years of education: Not on file  . Highest education level: Not on file  Occupational History  . Not on file  Tobacco Use  . Smoking status: Current Some Day Smoker  . Smokeless tobacco: Never Used   Substance and Sexual Activity  . Alcohol use: Yes    Comment: occassionally  . Drug use: Yes    Types: Marijuana  . Sexual activity: Yes    Birth control/protection: Condom  Other Topics Concern  . Not on file  Social History Narrative  . Not on file   Social Determinants of Health   Financial Resource Strain:   . Difficulty of Paying Living Expenses:   Food Insecurity:   . Worried About Programme researcher, broadcasting/film/video in the Last Year:   . Barista in the Last Year:   Transportation Needs:   . Freight forwarder (Medical):   Marland Kitchen Lack of Transportation (Non-Medical):   Physical Activity:   . Days of Exercise per Week:   . Minutes of Exercise per Session:   Stress:   . Feeling of Stress :   Social Connections:   . Frequency of Communication with Friends and Family:   . Frequency of Social Gatherings with Friends and Family:   . Attends Religious Services:   . Active Member of Clubs or Organizations:   . Attends Banker Meetings:   Marland Kitchen Marital Status:    Additional Social History:                         Sleep: Fair  Appetite:  Fair  Current Medications: Current Facility-Administered Medications  Medication Dose Route Frequency Provider Last Rate Last  Admin  . acetaminophen (TYLENOL) tablet 650 mg  650 mg Oral Q6H PRN Madison Boyer      . alum & mag hydroxide-simeth (MAALOX/MYLANTA) 200-200-20 MG/5ML suspension 30 mL  30 mL Oral Q4H PRN Madison Boyer      . benztropine (COGENTIN) tablet 0.5 mg  0.5 mg Oral BID Madison Boyer      . benztropine (COGENTIN) tablet 2 mg  2 mg Oral BID PRN Madison Boyer       Or  . benztropine mesylate (COGENTIN) injection 2 mg  2 mg Intramuscular BID PRN Madison Boyer      . hydrOXYzine (ATARAX/VISTARIL) tablet 25 mg  25 mg Oral TID PRN Madison Boyer   25 mg at 02/17/20 1306  . magnesium hydroxide (MILK OF MAGNESIA) suspension 30 mL  30 mL Oral Daily PRN Madison Boyer       . risperiDONE (RISPERDAL) tablet 2 mg  2 mg Oral BID Madison Boyer   2 mg at 02/17/20 0819  . traZODone (DESYREL) tablet 50 mg  50 mg Oral QHS PRN Madison Boyer   50 mg at 02/16/20 2113    Lab Results:  Results for orders placed or performed during the hospital encounter of 02/15/20 (from the past 48 hour(s))  SARS Coronavirus 2 by RT PCR (hospital order, performed in [facility] hospital lab) Nasopharyngeal Nasopharyngeal Swab     Status: None   Collection Time: 02/15/20  8:16 PM   Specimen: Nasopharyngeal Swab  Result Value Ref Range   SARS Coronavirus 2 NEGATIVE NEGATIVE    Comment: (NOTE) SARS-CoV-2 target nucleic acids are NOT DETECTED. The SARS-CoV-2 RNA is generally detectable in upper and lower respiratory specimens during the acute phase of infection. The lowest concentration of SARS-CoV-2 viral copies this assay can detect is 250 copies / mL. A negative result does not preclude SARS-CoV-2 infection and should not be used as the sole basis for treatment or other patient management decisions.  A negative result may occur with improper specimen collection / handling, submission of specimen other than nasopharyngeal swab, presence of viral mutation(s) within the areas targeted by this assay, and inadequate number of viral copies (<250 copies / mL). A negative result must be combined with clinical observations, patient history, and epidemiological information. Fact Sheet for Patients:   StrictlyIdeas.no Fact Sheet for Healthcare Providers: BankingDealers.co.za This test is not yet approved or cleared  by the [place] FDA and has been authorized for detection and/or diagnosis of SARS-CoV-2 by FDA under an Emergency Use Authorization (EUA).  This EUA will remain in effect (meaning this test can be used) for the duration of the COVID-19 declaration under Section 564(b)(1) of the Act, 21 U.S.C. section  360bbb-3(b)(1), unless the authorization is terminated or revoked sooner. Performed at [facility], [address] [place], [place] [address]   Urine Drug Screen, Qualitative     Status: Abnormal   Collection Time: 02/15/20  9:03 PM  Result Value Ref Range   Tricyclic, Ur Screen NONE DETECTED NONE DETECTED   Amphetamines, Ur Screen NONE DETECTED NONE DETECTED   MDMA (Ecstasy)Ur Screen NONE DETECTED NONE DETECTED   Cocaine Metabolite,Ur [place] NONE DETECTED NONE DETECTED   Opiate, Ur Screen NONE DETECTED NONE DETECTED   Phencyclidine (PCP) Ur S NONE DETECTED NONE DETECTED   Cannabinoid 50 Ng, Ur [place] POSITIVE (A) NONE DETECTED   Barbiturates, Ur Screen NONE DETECTED  NONE DETECTED   Benzodiazepine, Ur Scrn POSITIVE (A) NONE DETECTED   Methadone Scn, Ur NONE DETECTED NONE DETECTED    Comment: (NOTE) Tricyclics + metabolites, urine    Cutoff 1000 ng/mL Amphetamines + metabolites, urine  Cutoff 1000 ng/mL MDMA (Ecstasy), urine              Cutoff 500 ng/mL Cocaine Metabolite, urine          Cutoff 300 ng/mL Opiate + metabolites, urine        Cutoff 300 ng/mL Phencyclidine (PCP), urine         Cutoff 25 ng/mL Cannabinoid, urine                 Cutoff 50 ng/mL Barbiturates + metabolites, urine  Cutoff 200 ng/mL Benzodiazepine, urine              Cutoff 200 ng/mL Methadone, urine                   Cutoff 300 ng/mL The urine drug screen provides only a preliminary, unconfirmed analytical test result and should not be used for non-medical purposes. Clinical consideration and professional judgment should be applied to any positive drug screen result due to possible interfering substances. A more specific alternate chemical method must be used in order to obtain a confirmed analytical result. Gas chromatography / mass spectrometry (GC/MS) is the preferred confirmat ory method. Performed at [facility], [address] [place], [place] [address]   Pregnancy, urine POC      Status: None   Collection Time: 02/15/20  9:07 PM  Result Value Ref Range   Preg Test, Ur NEGATIVE NEGATIVE    Comment:        THE SENSITIVITY OF THIS METHODOLOGY IS >24 mIU/mL     Blood Alcohol level:  Lab Results  Component Value Date   ETH <10 02/15/2020    Metabolic Disorder Labs: No results found for: HGBA1C, MPG No results found for: PROLACTIN No results found for: CHOL, TRIG, HDL, CHOLHDL, VLDL, LDLCALC  Physical Findings: AIMS: Facial and Oral Movements Muscles of Facial Expression: None, normal Lips and Perioral Area: None, normal Jaw: None, normal Tongue: None, normal,Extremity Movements Upper (arms, wrists, hands, fingers): None, normal Lower (legs, knees, ankles, toes): None, normal, Trunk Movements Neck, shoulders, hips: None, normal, Overall Severity Severity of abnormal movements (highest score from questions above): None, normal Incapacitation due to abnormal movements: None, normal Patient's awareness of abnormal movements (rate only patient's report): No Awareness, Dental Status Current problems with teeth and/or dentures?: No Does patient usually wear dentures?: No  CIWA:    COWS:     Musculoskeletal: Strength & Muscle Tone: within normal limits Gait & Station: normal Patient leans: N/A  Psychiatric Specialty Exam: Physical Exam  Nursing note and vitals reviewed. Constitutional: She appears well-developed and well-nourished.  HENT:  Head: Normocephalic and atraumatic.  Eyes: Pupils are equal, round, and reactive to light. Conjunctivae are normal.  Cardiovascular: Regular rhythm and normal heart sounds.  Respiratory: Effort normal.  GI: Soft.  Musculoskeletal:        General: Normal range of motion.     Cervical back: Normal range of motion.  Neurological: She is alert.  Skin: Skin is warm and dry.  Psychiatric: Her affect is blunt and inappropriate. Her speech is tangential. She is withdrawn and actively hallucinating. She is not agitated  and not aggressive. Thought content is delusional. She expresses impulsivity. She expresses no homicidal ideation. She exhibits  abnormal recent memory. She is inattentive.    Review of Systems  Constitutional: Negative.   HENT: Negative.   Eyes: Negative.   Respiratory: Negative.   Cardiovascular: Negative.   Gastrointestinal: Negative.   Musculoskeletal: Negative.   Skin: Negative.   Neurological: Negative.   Psychiatric/Behavioral: Positive for confusion and hallucinations. The patient is nervous/anxious.     Blood pressure (!) 150/92, pulse (!) 128, temperature 98.3 F (36.8 C), temperature source Oral, resp. rate 18, height 5\' 2"  (1.575 m), weight 79.4 kg, SpO2 99 %.Body mass index is 32.01 kg/m.  General Appearance: Casual  Eye Contact:  Minimal  Speech:  Slow  Volume:  Decreased  Mood:  Euthymic  Affect:  Flat  Thought Process:  Disorganized  Orientation:  Negative  Thought Content:  Illogical  Suicidal Thoughts:  No  Homicidal Thoughts:  No  Memory:  Immediate;   Fair Recent;   Poor Remote;   Fair  Judgement:  Impaired  Insight:  Shallow  Psychomotor Activity:  Decreased  Concentration:  Concentration: Poor  Recall:  Poor  Fund of Knowledge:  Fair  Language:  Fair  Akathisia:  No  Handed:  Right  AIMS (if indicated):     Assets:  Desire for Improvement Housing Physical Health  ADL's:  Impaired  Cognition:  Impaired,  Mild  Sleep:  Number of Hours: 4.5     Treatment Plan Summary: Daily contact with patient to assess and evaluate symptoms and progress in treatment, Medication management and Plan 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  Patient is taking her Risperdal.  Continue for now the 4 mg twice a day adding low-dose Cogentin for some of this "shaking".  Daily reassessment and involvement in groups.  Hopefully we can get some real improvement and try for long acting  antipsychotic  15, Boyer 02/17/2020, 5:39 PM

## 2020-02-17 NOTE — BHH Group Notes (Signed)
Balance In Life 02/17/2020 9:30AM/1PM  Type of Therapy/Topic:  Group Therapy:  Balance in Life  Participation Level:  Did Not Attend  Description of Group:   This group will address the concept of balance and how it feels and looks when one is unbalanced. Patients will be encouraged to process areas in their lives that are out of balance and identify reasons for remaining unbalanced. Facilitators will guide patients in utilizing problem-solving interventions to address and correct the stressor making their life unbalanced. Understanding and applying boundaries will be explored and addressed for obtaining and maintaining a balanced life. Patients will be encouraged to explore ways to assertively make their unbalanced needs known to significant others in their lives, using other group members and facilitator for support and feedback.  Therapeutic Goals: 1. Patient will identify two or more emotions or situations they have that consume much of in their lives. 2. Patient will identify signs/triggers that life has become out of balance:  3. Patient will identify two ways to set boundaries in order to achieve balance in their lives:  4. Patient will demonstrate ability to communicate their needs through discussion and/or role plays  Summary of Patient Progress:    Therapeutic Modalities:   Cognitive Behavioral Therapy Solution-Focused Therapy Assertiveness Training  Tynslee Bowlds Philip Aspen, LCSW

## 2020-02-18 NOTE — Plan of Care (Signed)
°  Problem: Education: °Goal: Knowledge of [facility] Education information/materials will improve °Outcome: Not Progressing °Goal: Emotional status will improve °Outcome: Not Progressing °Goal: Mental status will improve °Outcome: Not Progressing °Goal: Verbalization of understanding the information provided will improve °Outcome: Not Progressing °  °

## 2020-02-18 NOTE — Tx Team (Addendum)
Interdisciplinary Treatment and Diagnostic Plan Update  02/18/2020 Time of Session: 9am Madison Boyer MRN: [id number]  Principal Diagnosis: Schizoaffective disorder [facility])  Secondary Diagnoses: Principal Problem:   Schizoaffective disorder ([place]) Active Problems:   Dysmenorrhea   Current Medications:  Current Facility-Administered Medications  Medication Dose Route Frequency Provider Last Rate Last Admin  . acetaminophen (TYLENOL) tablet 650 mg  650 mg Oral Q6H PRN Deloria Lair, NP   650 mg at 02/18/20 0645  . alum & mag hydroxide-simeth (MAALOX/MYLANTA) 200-200-20 MG/5ML suspension 30 mL  30 mL Oral Q4H PRN Dixon, Rashaun M, NP      . benztropine (COGENTIN) tablet 0.5 mg  0.5 mg Oral BID Clapacs, Madie Reno, MD   0.5 mg at 02/18/20 0759  . benztropine (COGENTIN) tablet 2 mg  2 mg Oral BID PRN Lavella Hammock, MD       Or  . benztropine mesylate (COGENTIN) injection 2 mg  2 mg Intramuscular BID PRN Lavella Hammock, MD      . hydrOXYzine (ATARAX/VISTARIL) tablet 25 mg  25 mg Oral TID PRN Deloria Lair, NP   25 mg at 02/17/20 2127  . magnesium hydroxide (MILK OF MAGNESIA) suspension 30 mL  30 mL Oral Daily PRN Deloria Lair, NP      . risperiDONE (RISPERDAL) tablet 2 mg  2 mg Oral BID Lavella Hammock, MD   2 mg at 02/18/20 0759  . traZODone (DESYREL) tablet 50 mg  50 mg Oral QHS PRN Deloria Lair, NP   50 mg at 02/17/20 2127   PTA Medications: Medications Prior to Admission  Medication Sig Dispense Refill Last Dose  . norgestimate-ethinyl estradiol (ORTHO-CYCLEN) 0.25-35 MG-MCG tablet Take 1 tablet by mouth daily. 1 Package 11     Patient Stressors: Medication change or noncompliance Substance abuse  Patient Strengths: Supportive family/friends  Treatment Modalities: Medication Management, Group therapy, Case management,  1 to 1 session with clinician, Psychoeducation, Recreational therapy.   Physician Treatment Plan for Primary Diagnosis:  Schizoaffective disorder ([place]) Long Term Goal(s): Improvement in symptoms so as ready for discharge Improvement in symptoms so as ready for discharge   Short Term Goals: Ability to identify changes in lifestyle to reduce recurrence of condition will improve Ability to verbalize feelings will improve Ability to demonstrate self-control will improve Ability to identify and develop effective coping behaviors will improve Ability to maintain clinical measurements within normal limits will improve Compliance with prescribed medications will improve Ability to identify triggers associated with substance abuse/mental health issues will improve Ability to identify changes in lifestyle to reduce recurrence of condition will improve Ability to verbalize feelings will improve Ability to demonstrate self-control will improve Ability to identify and develop effective coping behaviors will improve Ability to maintain clinical measurements within normal limits will improve Compliance with prescribed medications will improve Ability to identify triggers associated with substance abuse/mental health issues will improve  Medication Management: Evaluate patient's response, side effects, and tolerance of medication regimen.  Therapeutic Interventions: 1 to 1 sessions, Unit Group sessions and Medication administration.  Evaluation of Outcomes: Not Met  Physician Treatment Plan for Secondary Diagnosis: Principal Problem:   Schizoaffective disorder ([place]) Active Problems:   Dysmenorrhea  Long Term Goal(s): Improvement in symptoms so as ready for discharge Improvement in symptoms so as ready for discharge   Short Term Goals: Ability to identify changes in lifestyle to reduce recurrence of condition will improve Ability to verbalize feelings will improve Ability to demonstrate self-control will  improve Ability to identify and develop effective coping behaviors will improve Ability to maintain clinical  measurements within normal limits will improve Compliance with prescribed medications will improve Ability to identify triggers associated with substance abuse/mental health issues will improve Ability to identify changes in lifestyle to reduce recurrence of condition will improve Ability to verbalize feelings will improve Ability to demonstrate self-control will improve Ability to identify and develop effective coping behaviors will improve Ability to maintain clinical measurements within normal limits will improve Compliance with prescribed medications will improve Ability to identify triggers associated with substance abuse/mental health issues will improve     Medication Management: Evaluate patient's response, side effects, and tolerance of medication regimen.  Therapeutic Interventions: 1 to 1 sessions, Unit Group sessions and Medication administration.  Evaluation of Outcomes: Not Met   RN Treatment Plan for Primary Diagnosis: Schizoaffective disorder ([place]) Long Term Goal(s): Knowledge of disease and therapeutic regimen to maintain health will improve  Short Term Goals: Ability to participate in decision making will improve, Ability to verbalize feelings will improve, Ability to identify and develop effective coping behaviors will improve and Compliance with prescribed medications will improve  Medication Management: RN will administer medications as ordered by provider, will assess and evaluate patient's response and provide education to patient for prescribed medication. RN will report any adverse and/or side effects to prescribing provider.  Therapeutic Interventions: 1 on 1 counseling sessions, Psychoeducation, Medication administration, Evaluate responses to treatment, Monitor vital signs and CBGs as ordered, Perform/monitor CIWA, COWS, AIMS and Fall Risk screenings as ordered, Perform wound care treatments as ordered.  Evaluation of Outcomes: Not Met   LCSW Treatment Plan  for Primary Diagnosis: Schizoaffective disorder ([place]) Long Term Goal(s): Safe transition to appropriate next level of care at discharge, Engage patient in therapeutic group addressing interpersonal concerns.  Short Term Goals: Engage patient in aftercare planning with referrals and resources  Therapeutic Interventions: Assess for all discharge needs, 1 to 1 time with Social worker, Explore available resources and support systems, Assess for adequacy in community support network, Educate family and significant other(s) on suicide prevention, Complete Psychosocial Assessment, Interpersonal group therapy.  Evaluation of Outcomes: Not Met   Progress in Treatment: Attending groups: No. Participating in groups: No. Taking medication as prescribed: Yes. Toleration medication: Yes. Family/Significant other contact made: No, will contact:  when pt gives consent Patient understands diagnosis: No. Discussing patient identified problems/goals with staff: Yes. Medical problems stabilized or resolved: No. Denies suicidal/homicidal ideation: Yes. Issues/concerns per patient self-inventory: No. Other: NA  New problem(s) identified: No, Describe:  None reported  New Short Term/Long Term Goal(s):Attend outpatient treatment, take medication as prescribed, develop and implement healthy coping methods  Patient Goals:  "To get better'  Discharge Plan or Barriers: Pt will return home and follow up with outpatient treatment  Reason for Continuation of Hospitalization: Delusions  Medication stabilization  Estimated Length of Stay:1-7 days  Recreational Therapy: Patient Stressors: Family, Friends Patient Goal: Patient will identify 3 positive coping skills strategies to use post d/c within 5 recreation therapy group sessions  Attendees: Patient:Madison Boyer 02/18/2020 11:00 AM  Physician: Alethia Berthold 02/18/2020 11:00 AM  Nursing: Collier Bullock 02/18/2020 11:00 AM  RN Care Manager: 02/18/2020 11:00  AM  Social Worker: Sanjuana Kava Buda Stanfield 02/18/2020 11:00 AM  Recreational Therapist: Isaias Sakai Leoncio Hansen 02/18/2020 11:00 AM  Other:  02/18/2020 11:00 AM  Other:  02/18/2020 11:00 AM  Other: 02/18/2020 11:00 AM    Scribe for Treatment Team: Yvette Rack, LCSW 02/18/2020  11:00 AM

## 2020-02-18 NOTE — [facility] Suicide Risk Assessment (Signed)
[facility] INPATIENT:  Family/Significant Other Suicide Prevention Education  Suicide Prevention Education:  Education Completed; Karl Pock, father 10 [phone]  has been identified by the patient as the family member/significant other with whom the patient will be residing, and identified as the person(s) who will aid the patient in the event of a mental health crisis (suicidal ideations/suicide attempt).  With written consent from the patient, the family member/significant other has been provided the following suicide prevention education, prior to the and/or following the discharge of the patient.  The suicide prevention education provided includes the following:  Suicide risk factors  Suicide prevention and interventions  National Suicide Hotline telephone number  [facility] assessment telephone number  [facility] Emergency Assistance 911  [facility] and/or Residential Mobile Crisis Unit telephone number  Request made of family/significant other to:  Remove weapons (e.g., guns, rifles, knives), all items previously/currently identified as safety concern.    Remove drugs/medications (over-the-counter, prescriptions, illicit drugs), all items previously/currently identified as a safety concern.  The family member/significant other verbalizes understanding of the suicide prevention education information provided.  The family member/significant other agrees to remove the items of safety concern listed above. Mr. Phylis Bougie reports the pt came into the hospital because "she was nervous and shaking. She has anger issues." He denies the pt having access to guns or weapons in the home. He does not report any reservation about the pt returning to the home. No other details provided at this time.  Matricia Begnaud T Aldwin Micalizzi 02/18/2020, 11:23 AM

## 2020-02-18 NOTE — Progress Notes (Signed)
Patient still presenting with disorganized thoughts. Was fixated on going home. Asking for her uncle's number. Called her uncle and asked him to come to the hospital to pick her up. He presented at the front dest to get her and was provided with patient's code number per patient consent. Patient signed consent to release information to the uncle, so he was advised that she is not being discharged today and he did not need to pick her up. Medication compliant

## 2020-02-18 NOTE — Plan of Care (Signed)
Patient is pacing in the hallway with gestures like she is hand cuffed.Patient continues to talk disorganized.Patient states " I am a bad person,I am stressed." Denies SI,HI and AVH.Compliant with medications.Minimal interactions with staff & peers.Appetite and energy level good.Support and encouragement given.

## 2020-02-18 NOTE — Progress Notes (Signed)
[facility] MD Progress Note  02/18/2020 6:27 PM Madison Boyer  MRN:  [id number] Subjective: Patient seen and chart reviewed.  Patient presents as neatly groomed and generally cooperative although not attending very many groups and not interacting much when she does.  In interview with me the patient was disorganized in her thinking.  Very difficult to follow.  She will answer some questions in a straightforward way and others she will give multiple contradictory answers to and then start shaking and whining in a very childlike manner.  No complaints about medication no complaints of any physical symptoms. Principal Problem: Schizoaffective disorder (HCC) Diagnosis: Principal Problem:   Schizoaffective disorder (HCC) Active Problems:   Dysmenorrhea  Total Time spent with patient: 30 minutes  Past Psychiatric History: Patient has had visits to the emergency room and psychiatric hospitalizations apparently very acutely just in the last few months  Past Medical History: History reviewed. No pertinent past medical history.  Past Surgical History:  Procedure Laterality Date  . APPENDECTOMY     Family History:  Family History  Problem Relation Age of Onset  . Healthy Mother   . Healthy Father   . Healthy Maternal Grandmother   . Diabetes Maternal Grandfather   . Healthy Paternal Grandmother   . Healthy Paternal Grandfather    Family Psychiatric  History: Unknown Social History:  Social History   Substance and Sexual Activity  Alcohol Use Yes   Comment: occassionally     Social History   Substance and Sexual Activity  Drug Use Yes  . Types: Marijuana    Social History   Socioeconomic History  . Marital status: Single    Spouse name: Not on file  . Number of children: Not on file  . Years of education: Not on file  . Highest education level: Not on file  Occupational History  . Not on file  Tobacco Use  . Smoking status: Current Some Day Smoker  . Smokeless  tobacco: Never Used  Substance and Sexual Activity  . Alcohol use: Yes    Comment: occassionally  . Drug use: Yes    Types: Marijuana  . Sexual activity: Yes    Birth control/protection: Condom  Other Topics Concern  . Not on file  Social History Narrative  . Not on file   Social Determinants of Health   Financial Resource Strain:   . Difficulty of Paying Living Expenses:   Food Insecurity:   . Worried About Programme researcher, broadcasting/film/video in the Last Year:   . Barista in the Last Year:   Transportation Needs:   . Freight forwarder (Medical):   Marland Kitchen Lack of Transportation (Non-Medical):   Physical Activity:   . Days of Exercise per Week:   . Minutes of Exercise per Session:   Stress:   . Feeling of Stress :   Social Connections:   . Frequency of Communication with Friends and Family:   . Frequency of Social Gatherings with Friends and Family:   . Attends Religious Services:   . Active Member of Clubs or Organizations:   . Attends Banker Meetings:   Marland Kitchen Marital Status:    Additional Social History:                         Sleep: Fair  Appetite:  Negative  Current Medications: Current Facility-Administered Medications  Medication Dose Route Frequency Tikia Skilton Last Rate Last Admin  . acetaminophen (TYLENOL) tablet 650  mg  650 mg Oral Q6H PRN Deloria Lair, NP   650 mg at 02/18/20 0645  . alum & mag hydroxide-simeth (MAALOX/MYLANTA) 200-200-20 MG/5ML suspension 30 mL  30 mL Oral Q4H PRN Dixon, Rashaun M, NP      . benztropine (COGENTIN) tablet 0.5 mg  0.5 mg Oral BID Clapacs, John T, MD   0.5 mg at 02/18/20 1706  . benztropine (COGENTIN) tablet 2 mg  2 mg Oral BID PRN Lavella Hammock, MD       Or  . benztropine mesylate (COGENTIN) injection 2 mg  2 mg Intramuscular BID PRN Lavella Hammock, MD      . hydrOXYzine (ATARAX/VISTARIL) tablet 25 mg  25 mg Oral TID PRN Deloria Lair, NP   25 mg at 02/17/20 2127  . magnesium hydroxide (MILK OF  MAGNESIA) suspension 30 mL  30 mL Oral Daily PRN Deloria Lair, NP      . risperiDONE (RISPERDAL) tablet 2 mg  2 mg Oral BID Lavella Hammock, MD   2 mg at 02/18/20 0759  . traZODone (DESYREL) tablet 50 mg  50 mg Oral QHS PRN Deloria Lair, NP   50 mg at 02/17/20 2127    Lab Results: No results found for this or any previous visit (from the past 48 hour(s)).  Blood Alcohol level:  Lab Results  Component Value Date   ETH <10 38/18/2993    Metabolic Disorder Labs: No results found for: HGBA1C, MPG No results found for: PROLACTIN No results found for: CHOL, TRIG, HDL, CHOLHDL, VLDL, LDLCALC  Physical Findings: AIMS: Facial and Oral Movements Muscles of Facial Expression: None, normal Lips and Perioral Area: None, normal Jaw: None, normal Tongue: None, normal,Extremity Movements Upper (arms, wrists, hands, fingers): None, normal Lower (legs, knees, ankles, toes): None, normal, Trunk Movements Neck, shoulders, hips: None, normal, Overall Severity Severity of abnormal movements (highest score from questions above): None, normal Incapacitation due to abnormal movements: None, normal Patient's awareness of abnormal movements (rate only patient's report): No Awareness, Dental Status Current problems with teeth and/or dentures?: No Does patient usually wear dentures?: No  CIWA:    COWS:     Musculoskeletal: Strength & Muscle Tone: within normal limits Gait & Station: normal Patient leans: N/A  Psychiatric Specialty Exam: Physical Exam  Nursing note and vitals reviewed. Constitutional: She appears well-developed and well-nourished.  HENT:  Head: Normocephalic and atraumatic.  Eyes: Pupils are equal, round, and reactive to light. Conjunctivae are normal.  Cardiovascular: Regular rhythm and normal heart sounds.  Respiratory: Effort normal.  GI: Soft.  Musculoskeletal:        General: Normal range of motion.     Cervical back: Normal range of motion.  Neurological: She is  alert.  Skin: Skin is warm and dry.  Psychiatric: Her affect is labile. Her speech is tangential. She is agitated. She is not aggressive. Thought content is delusional. Cognition and memory are impaired. She expresses inappropriate judgment. She expresses no homicidal and no suicidal ideation.    Review of Systems  Constitutional: Negative.   HENT: Negative.   Eyes: Negative.   Respiratory: Negative.   Cardiovascular: Negative.   Gastrointestinal: Negative.   Musculoskeletal: Negative.   Skin: Negative.   Neurological: Negative.   Psychiatric/Behavioral: Positive for behavioral problems. The patient is nervous/anxious.     Blood pressure 124/84, pulse (!) 108, temperature (!) 97.5 F (36.4 C), temperature source Oral, resp. rate 17, height 5\' 2"  (1.575 m), weight 79.4 kg,  SpO2 100 %.Body mass index is 32.01 kg/m.  General Appearance: Fairly Groomed  Eye Contact:  Fair  Speech:  Garbled  Volume:  Decreased  Mood:  Irritable  Affect:  Inappropriate  Thought Process:  Disorganized  Orientation:  Negative  Thought Content:  Illogical  Suicidal Thoughts:  No  Homicidal Thoughts:  No  Memory:  Immediate;   Fair Recent;   Fair Remote;   Fair  Judgement:  Impaired  Insight:  Shallow  Psychomotor Activity:  Restlessness  Concentration:  Concentration: Poor  Recall:  [employer] of Knowledge:  Fair  Language:  Fair  Akathisia:  No  Handed:  Right  AIMS (if indicated):     Assets:  Housing Social Support  ADL's:  Impaired  Cognition:  Impaired,  Mild  Sleep:  Number of Hours: 7.25     Treatment Plan Summary: Daily contact with patient to assess and evaluate symptoms and progress in treatment, Medication management and Plan 19 year old with what appears to be new onset psychosis.  Some staff observing the patient and feel that she may be acting out intentionally at times although to what it and would be unclear.  Seems to be psychotic in conversation with me.  She appears to be  taking her medicine but so far no change from admission.  Tried to form some rapport with her.  Reviewed medication.  Encourage group attendance.  Mordecai Rasmussen, MD 02/18/2020, 6:27 PM

## 2020-02-18 NOTE — Progress Notes (Signed)
Recreation Therapy Notes  Date: 02/18/2020  Time: 9:30 am   Location: Craft room    Behavioral response: N/A   Intervention Topic: Problem-Solving   Discussion/Intervention: Patient did not attend group.   Clinical Observations/Feedback:  Patient did not attend group.   Keidan Aumiller LRT/CTRS         Mykell Rawl 02/18/2020 11:01 AM

## 2020-02-18 NOTE — [facility] Group Notes (Signed)
LCSW Group Therapy Note  02/18/2020 2:39 PM  Type of Therapy/Topic:  Group Therapy:  Feelings about Diagnosis  Participation Level:  None   Description of Group:   This group will allow patients to explore their thoughts and feelings about diagnoses they have received. Patients will be guided to explore their level of understanding and acceptance of these diagnoses. Facilitator will encourage patients to process their thoughts and feelings about the reactions of others to their diagnosis and will guide patients in identifying ways to discuss their diagnosis with significant others in their lives. This group will be process-oriented, with patients participating in exploration of their own experiences, giving and receiving support, and processing challenge from other group members.   Therapeutic Goals: 1. Patient will demonstrate understanding of diagnosis as evidenced by identifying two or more symptoms of the disorder 2. Patient will be able to express two feelings regarding the diagnosis 3. Patient will demonstrate their ability to communicate their needs through discussion and/or role play  Summary of Patient Progress: Patient was present however did not engage in group discussion.   Therapeutic Modalities:   Cognitive Behavioral Therapy Brief Therapy Feelings Identification   Penni Homans, MSW, LCSW 02/18/2020 2:39 PM

## 2020-02-19 MED ORDER — HALOPERIDOL 5 MG PO TABS
5.0000 mg | ORAL_TABLET | Freq: Two times a day (BID) | ORAL | Status: DC
  Administered 2020-02-19 – 2020-02-25 (×12): 5 mg via ORAL
  Filled 2020-02-19 (×12): qty 1

## 2020-02-19 NOTE — [facility] Group Notes (Signed)
Emotional Regulation 02/19/2020 9:30AM/1PM  Type of Therapy/Topic:  Group Therapy:  Emotion Regulation  Participation Level:  Active   Description of Group:   The purpose of this group is to assist patients in learning to regulate negative emotions and experience positive emotions. Patients will be guided to discuss ways in which they have been vulnerable to their negative emotions. These vulnerabilities will be juxtaposed with experiences of positive emotions or situations, and patients will be challenged to use positive emotions to combat negative ones. Special emphasis will be placed on coping with negative emotions in conflict situations, and patients will process healthy conflict resolution skills.  Therapeutic Goals: 1. Patient will identify two positive emotions or experiences to reflect on in order to balance out negative emotions 2. Patient will label two or more emotions that they find the most difficult to experience 3. Patient will demonstrate positive conflict resolution skills through discussion and/or role plays  Summary of Patient Progress: Actively and appropriately participated in group session. Pt states when she becomes overwhelmed she begins to "shake and get anxious" Pt reports walking and listening to music helps calm her down. Pt discussed having a strong support system who help her manage her behavior when she has mood changes.      Therapeutic Modalities:   Cognitive Behavioral Therapy Feelings Identification Dialectical Behavioral Therapy   Suzan Slick, LCSW 02/19/2020 1:56 PM

## 2020-02-19 NOTE — Progress Notes (Signed)
Recreation Therapy Notes  Date: 02/19/2020  Time: 9:30 am   Location: Craft room    Behavioral response: N/A   Intervention Topic: Relaxation    Discussion/Intervention: Patient did not attend group.   Clinical Observations/Feedback:  Patient did not attend group.   Stanly Si LRT/CTRS         Brnadon Eoff 02/19/2020 10:45 AM

## 2020-02-19 NOTE — Plan of Care (Signed)
The patient is responding to internal stimuli.  Problem: Coping: Goal: Ability to verbalize frustrations and anger appropriately will improve Outcome: Progressing Goal: Ability to demonstrate self-control will improve Outcome: Progressing

## 2020-02-19 NOTE — Progress Notes (Signed)
F - Decrease symptoms of psychosis.  D - The patient paced the unit and appeared to be responding to internal stimuli.  Bizarre behaviors observed by multiple staff members.  [place] denied thoughts of harming herself and others.  Interactions with peers were brief.  The patient accepted medications as ordered.    A - Medications provided as ordered.  Emotional support provided.    R - Continue with care.

## 2020-02-19 NOTE — Progress Notes (Signed)
[facility] MD Progress Note  02/19/2020 3:15 PM Madison Boyer  MRN:  [id number] Subjective: Seen and interviewed.  Patient continues to pace on the unit much of the day.  Stereotyped and bizarre behaviors frequently.  Strange hand movements.  On interview she is agitated with inappropriate affect.  Frequently does childlike behavior of kicking and smashing her hands on the chair with whining behavior.  Repeats herself frequently asking to go to jail.  I tried to carry on some kind of rational conversation and she becomes disorganized talking about how she "did not do it".  Patient still appears to be psychotic and unreceptive to psychoeducation. Principal Problem: Schizoaffective disorder (HCC) Diagnosis: Principal Problem:   Schizoaffective disorder (HCC) Active Problems:   Dysmenorrhea  Total Time spent with patient: 30 minutes  Past Psychiatric History: Seems to have developed psychotic symptoms recently just in the last few months.  Past Medical History: History reviewed. No pertinent past medical history.  Past Surgical History:  Procedure Laterality Date  . APPENDECTOMY     Family History:  Family History  Problem Relation Age of Onset  . Healthy Mother   . Healthy Father   . Healthy Maternal Grandmother   . Diabetes Maternal Grandfather   . Healthy Paternal Grandmother   . Healthy Paternal Grandfather    Family Psychiatric  History: See previous Social History:  Social History   Substance and Sexual Activity  Alcohol Use Yes   Comment: occassionally     Social History   Substance and Sexual Activity  Drug Use Yes  . Types: Marijuana    Social History   Socioeconomic History  . Marital status: Single    Spouse name: Not on file  . Number of children: Not on file  . Years of education: Not on file  . Highest education level: Not on file  Occupational History  . Not on file  Tobacco Use  . Smoking status: Current Some Day Smoker  . Smokeless tobacco:  Never Used  Substance and Sexual Activity  . Alcohol use: Yes    Comment: occassionally  . Drug use: Yes    Types: Marijuana  . Sexual activity: Yes    Birth control/protection: Condom  Other Topics Concern  . Not on file  Social History Narrative  . Not on file   Social Determinants of Health   Financial Resource Strain:   . Difficulty of Paying Living Expenses:   Food Insecurity:   . Worried About Programme researcher, broadcasting/film/video in the Last Year:   . Barista in the Last Year:   Transportation Needs:   . Freight forwarder (Medical):   Marland Kitchen Lack of Transportation (Non-Medical):   Physical Activity:   . Days of Exercise per Week:   . Minutes of Exercise per Session:   Stress:   . Feeling of Stress :   Social Connections:   . Frequency of Communication with Friends and Family:   . Frequency of Social Gatherings with Friends and Family:   . Attends Religious Services:   . Active Member of Clubs or Organizations:   . Attends Banker Meetings:   Marland Kitchen Marital Status:    Additional Social History:                         Sleep: Fair  Appetite:  Fair  Current Medications: Current Facility-Administered Medications  Medication Dose Route Frequency Daire Okimoto Last Rate Last Admin  . acetaminophen (  TYLENOL) tablet 650 mg  650 mg Oral Q6H PRN Jearld Lesch, NP   650 mg at 02/18/20 0645  . alum & mag hydroxide-simeth (MAALOX/MYLANTA) 200-200-20 MG/5ML suspension 30 mL  30 mL Oral Q4H PRN Dixon, Rashaun M, NP      . benztropine (COGENTIN) tablet 0.5 mg  0.5 mg Oral BID Clapacs, Jackquline Denmark, MD   0.5 mg at 02/19/20 0819  . benztropine (COGENTIN) tablet 2 mg  2 mg Oral BID PRN Mariel Craft, MD       Or  . benztropine mesylate (COGENTIN) injection 2 mg  2 mg Intramuscular BID PRN Mariel Craft, MD      . haloperidol (HALDOL) tablet 5 mg  5 mg Oral BID Clapacs, John T, MD      . hydrOXYzine (ATARAX/VISTARIL) tablet 25 mg  25 mg Oral TID PRN Jearld Lesch, NP    25 mg at 02/19/20 1231  . magnesium hydroxide (MILK OF MAGNESIA) suspension 30 mL  30 mL Oral Daily PRN Dixon, Rashaun M, NP      . traZODone (DESYREL) tablet 50 mg  50 mg Oral QHS PRN Jearld Lesch, NP   50 mg at 02/17/20 2127    Lab Results: No results found for this or any previous visit (from the past 48 hour(s)).  Blood Alcohol level:  Lab Results  Component Value Date   ETH <10 02/15/2020    Metabolic Disorder Labs: No results found for: HGBA1C, MPG No results found for: PROLACTIN No results found for: CHOL, TRIG, HDL, CHOLHDL, VLDL, LDLCALC  Physical Findings: AIMS: Facial and Oral Movements Muscles of Facial Expression: None, normal Lips and Perioral Area: None, normal Jaw: None, normal Tongue: None, normal,Extremity Movements Upper (arms, wrists, hands, fingers): None, normal Lower (legs, knees, ankles, toes): None, normal, Trunk Movements Neck, shoulders, hips: None, normal, Overall Severity Severity of abnormal movements (highest score from questions above): None, normal Incapacitation due to abnormal movements: None, normal Patient's awareness of abnormal movements (rate only patient's report): No Awareness, Dental Status Current problems with teeth and/or dentures?: No Does patient usually wear dentures?: No  CIWA:    COWS:     Musculoskeletal: Strength & Muscle Tone: within normal limits Gait & Station: normal Patient leans: N/A  Psychiatric Specialty Exam: Physical Exam  Nursing note and vitals reviewed. Constitutional: She appears well-developed and well-nourished.  HENT:  Head: Normocephalic and atraumatic.  Eyes: Pupils are equal, round, and reactive to light. Conjunctivae are normal.  Cardiovascular: Regular rhythm and normal heart sounds.  Respiratory: Effort normal. No respiratory distress.  GI: Soft.  Musculoskeletal:        General: Normal range of motion.     Cervical back: Normal range of motion.  Neurological: She is alert.  Skin:  Skin is warm and dry.  Psychiatric: Her affect is labile and inappropriate. Her speech is tangential. She is agitated. Thought content is delusional. Cognition and memory are impaired. She expresses inappropriate judgment.    Review of Systems  Constitutional: Negative.   HENT: Negative.   Eyes: Negative.   Respiratory: Negative.   Cardiovascular: Negative.   Gastrointestinal: Negative.   Musculoskeletal: Negative.   Skin: Negative.   Neurological: Negative.   Psychiatric/Behavioral: Positive for agitation and confusion.    Blood pressure 135/88, pulse (!) 108, temperature 98 F (36.7 C), temperature source Oral, resp. rate 17, height 5\' 2"  (1.575 m), weight 79.4 kg, SpO2 99 %.Body mass index is 32.01 kg/m.  General Appearance: Casual  Eye Contact:  Fair  Speech:  Garbled and Pressured  Volume:  Increased  Mood:  Irritable  Affect:  Inappropriate and Labile  Thought Process:  Disorganized  Orientation:  Negative  Thought Content:  Illogical, Rumination and Tangential  Suicidal Thoughts:  No  Homicidal Thoughts:  No  Memory:  Immediate;   Fair Recent;   Poor Remote;   Poor  Judgement:  Impaired  Insight:  Lacking  Psychomotor Activity:  Restlessness  Concentration:  Concentration: Poor  Recall:  Poor  Fund of Knowledge:  Fair  Language:  Fair  Akathisia:  No  Handed:  Right  AIMS (if indicated):     Assets:  Desire for Improvement Physical Health  ADL's:  Impaired  Cognition:  Impaired,  Mild  Sleep:  Number of Hours: 5.45     Treatment Plan Summary: Daily contact with patient to assess and evaluate symptoms and progress in treatment, Medication management and Plan After several days showing minimal improvement.  Also we would still hope to eventually get her on a long-acting injectable.  Discontinue Risperdal therefore and start Haldol 5 mg twice a day while continuing standing Cogentin.  Encourage patient to attend groups.  Explained rationale for treatment plan to  her.  Alethia Berthold, MD 02/19/2020, 3:15 PM

## 2020-02-20 NOTE — Progress Notes (Signed)
F - Decrease symptoms of psychosis.  D - During the morning hours, the patient paced the unit and appeared to be responding to internal stimuli.  She repeatedly stated that she would like to leave the unit and was frustrated with her hospital stay.  Vern denied thoughts of harming herself and others.  She accepted her medications as ordered and appeared to better manage her anxiety/agitation as the day progressed.  Adequate food and fluids consumed.  Interactions with peers and participation in groups was sporadic.    A - Medications provided as ordered.  Emotional support given and reminders of the reasons why the patient was admitted helpful.  Snacks provided.  R - Continue with care.

## 2020-02-20 NOTE — Plan of Care (Signed)
The patient is restless and agitated.  Problem: Education: Goal: Emotional status will improve Outcome: Progressing Goal: Mental status will improve Outcome: Progressing

## 2020-02-20 NOTE — Progress Notes (Signed)
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  Patient continues to pace most of the day although she was able to sit down and stay on topic slightly better today than she was yesterday.  Patient continues to focus on being discharged and when told that that is not the plan for today regresses into childlike behavior although not as badly as it seems she was doing yesterday.  Affect stays confused and constricted.  She will still make comments about killing people or killing herself although none of it in a way that seems to make any kind of sense.  She admits that she is still having hallucinations. Principal Problem: Schizoaffective disorder (HCC) Diagnosis: Principal Problem:   Schizoaffective disorder (HCC) Active Problems:   Dysmenorrhea  Total Time spent with patient: 30 minutes  Past Psychiatric History: Past history of what seems like a new onset psychosis with a couple hospitalizations just since the last few months  Past Medical History: History reviewed. No pertinent past medical history.  Past Surgical History:  Procedure Laterality Date  . APPENDECTOMY     Family History:  Family History  Problem Relation Age of Onset  . Healthy Mother   . Healthy Father   . Healthy Maternal Grandmother   . Diabetes Maternal Grandfather   . Healthy Paternal Grandmother   . Healthy Paternal Grandfather    Family Psychiatric  History: See previous Social History:  Social History   Substance and Sexual Activity  Alcohol Use Yes   Comment: occassionally     Social History   Substance and Sexual Activity  Drug Use Yes  . Types: Marijuana    Social History   Socioeconomic History  . Marital status: Single    Spouse name: Not on file  . Number of children: Not on file  . Years of education: Not on file  . Highest education level: Not on file  Occupational History  .  Not on file  Tobacco Use  . Smoking status: Current Some Day Smoker  . Smokeless tobacco: Never Used  Substance and Sexual Activity  . Alcohol use: Yes    Comment: occassionally  . Drug use: Yes    Types: Marijuana  . Sexual activity: Yes    Birth control/protection: Condom  Other Topics Concern  . Not on file  Social History Narrative  . Not on file   Social Determinants of Health   Financial Resource Strain:   . Difficulty of Paying Living Expenses:   Food Insecurity:   . Worried About Programme researcher, broadcasting/film/video in the Last Year:   . Barista in the Last Year:   Transportation Needs:   . Freight forwarder (Medical):   Marland Kitchen Lack of Transportation (Non-Medical):   Physical Activity:   . Days of Exercise per Week:   . Minutes of Exercise per Session:   Stress:   . Feeling of Stress :   Social Connections:   . Frequency of Communication with Friends and Family:   . Frequency of Social Gatherings with Friends and Family:   . Attends Religious Services:   . Active Member of Clubs or Organizations:   . Attends Banker Meetings:   Marland Kitchen Marital Status:    Additional Social History:                         Sleep:  Fair  Appetite:  Fair  Current Medications: Current Facility-Administered Medications  Medication Dose Route Frequency Provider Last Rate Last Admin  . acetaminophen (TYLENOL) tablet 650 mg  650 mg Oral Q6H PRN Jearld Lesch, NP   650 mg at 02/19/20 1829  . alum & mag hydroxide-simeth (MAALOX/MYLANTA) 200-200-20 MG/5ML suspension 30 mL  30 mL Oral Q4H PRN Dixon, Rashaun M, NP      . benztropine (COGENTIN) tablet 0.5 mg  0.5 mg Oral BID Kinesha Auten, Jackquline Denmark, MD   0.5 mg at 02/20/20 1062  . benztropine (COGENTIN) tablet 2 mg  2 mg Oral BID PRN Mariel Craft, MD       Or  . benztropine mesylate (COGENTIN) injection 2 mg  2 mg Intramuscular BID PRN Mariel Craft, MD      . haloperidol (HALDOL) tablet 5 mg  5 mg Oral BID Vi Whitesel, Jackquline Denmark, MD    5 mg at 02/20/20 6948  . hydrOXYzine (ATARAX/VISTARIL) tablet 25 mg  25 mg Oral TID PRN Jearld Lesch, NP   25 mg at 02/20/20 1005  . magnesium hydroxide (MILK OF MAGNESIA) suspension 30 mL  30 mL Oral Daily PRN Dixon, Rashaun M, NP      . traZODone (DESYREL) tablet 50 mg  50 mg Oral QHS PRN Jearld Lesch, NP   50 mg at 02/20/20 0222    Lab Results: No results found for this or any previous visit (from the past 48 hour(s)).  Blood Alcohol level:  Lab Results  Component Value Date   ETH <10 02/15/2020    Metabolic Disorder Labs: No results found for: HGBA1C, MPG No results found for: PROLACTIN No results found for: CHOL, TRIG, HDL, CHOLHDL, VLDL, LDLCALC  Physical Findings: AIMS: Facial and Oral Movements Muscles of Facial Expression: None, normal Lips and Perioral Area: None, normal Jaw: None, normal Tongue: None, normal,Extremity Movements Upper (arms, wrists, hands, fingers): None, normal Lower (legs, knees, ankles, toes): None, normal, Trunk Movements Neck, shoulders, hips: None, normal, Overall Severity Severity of abnormal movements (highest score from questions above): None, normal Incapacitation due to abnormal movements: None, normal Patient's awareness of abnormal movements (rate only patient's report): No Awareness, Dental Status Current problems with teeth and/or dentures?: No Does patient usually wear dentures?: No  CIWA:    COWS:     Musculoskeletal: Strength & Muscle Tone: within normal limits Gait & Station: normal Patient leans: N/A  Psychiatric Specialty Exam: Physical Exam  Nursing note and vitals reviewed. Constitutional: She appears well-developed and well-nourished.  HENT:  Head: Normocephalic and atraumatic.  Eyes: Pupils are equal, round, and reactive to light. Conjunctivae are normal.  Cardiovascular: Regular rhythm and normal heart sounds.  Respiratory: Effort normal. No respiratory distress.  GI: Soft.  Musculoskeletal:         General: Normal range of motion.     Cervical back: Normal range of motion.  Neurological: She is alert.  Skin: Skin is warm and dry.  Psychiatric: Her affect is blunt and inappropriate. Her speech is tangential. She is agitated. She is not aggressive. Thought content is paranoid and delusional. Cognition and memory are impaired. She expresses inappropriate judgment.    Review of Systems  Constitutional: Negative.   HENT: Negative.   Eyes: Negative.   Respiratory: Negative.   Cardiovascular: Negative.   Gastrointestinal: Negative.   Musculoskeletal: Negative.   Skin: Negative.   Neurological: Negative.   Psychiatric/Behavioral: Positive for dysphoric mood and hallucinations. The patient is nervous/anxious.  Blood pressure 123/83, pulse (!) 105, temperature (!) 97.5 F (36.4 C), temperature source Oral, resp. rate 17, height 5\' 2"  (1.575 m), weight 79.4 kg, SpO2 100 %.Body mass index is 32.01 kg/m.  General Appearance: Casual  Eye Contact:  Minimal  Speech:  Garbled  Volume:  Decreased  Mood:  Irritable  Affect:  Inappropriate and Labile  Thought Process:  Disorganized  Orientation:  Full (Time, Place, and Person)  Thought Content:  Illogical, Hallucinations: Auditory, Paranoid Ideation and Rumination  Suicidal Thoughts:  Yes.  without intent/plan  Homicidal Thoughts:  No  Memory:  Immediate;   Fair Recent;   Poor Remote;   Poor  Judgement:  Impaired  Insight:  Shallow  Psychomotor Activity:  Restlessness  Concentration:  Concentration: Poor  Recall:  Poor  Fund of Knowledge:  Fair  Language:  Poor  Akathisia:  No  Handed:  Right  AIMS (if indicated):     Assets:  Desire for Improvement Housing Physical Health Resilience  ADL's:  Impaired  Cognition:  Impaired,  Mild  Sleep:  Number of Hours: 5.45     Treatment Plan Summary: Daily contact with patient to assess and evaluate symptoms and progress in treatment, Medication management and Plan Medication was  switched to haloperidol yesterday.  She appears to be tolerating that at least as well as the Risperdal.  I am optimistic we may be seeing some improvement in some of her disorganized thinking although she remains bizarre in her thinking and behavior and continues to endorse hallucinations.  Tried to do some supportive counseling and tried to review the rationale for medication with the patient.  She was not able to stay on topic very well.  Continue current medication plan.  Continue involvement in groups and individual therapy and counseling.  Alethia Berthold, MD 02/20/2020, 2:49 PM

## 2020-02-20 NOTE — Progress Notes (Signed)
Prior to receiving medication patient was pacing the halls.  Exhibited childlike behaviors, with crying and whining spells, asking for pizza and to go home. Patient received prescribed medication and tolerated without incident.  Medication was effective and patient reduced pacing and eventually went to her room. She remains safe on the unit with 15 minute safety checks.

## 2020-02-20 NOTE — Plan of Care (Signed)
  Problem: Education: Goal: Knowledge of [facility] Education information/materials will improve Outcome: Not Progressing Goal: Emotional status will improve Outcome: Not Progressing Goal: Mental status will improve Outcome: Not Progressing Goal: Verbalization of understanding the information provided will improve Outcome: Not Progressing   Problem: Activity: Goal: Interest or engagement in activities will improve Outcome: Not Progressing Goal: Sleeping patterns will improve Outcome: Not Progressing   Problem: Coping: Goal: Ability to verbalize frustrations and anger appropriately will improve Outcome: Not Progressing Goal: Ability to demonstrate self-control will improve Outcome: Not Progressing   Problem: Health Behavior/Discharge Planning: Goal: Identification of resources available to assist in meeting health care needs will improve Outcome: Not Progressing Goal: Compliance with treatment plan for underlying cause of condition will improve Outcome: Not Progressing   Problem: Physical Regulation: Goal: Ability to maintain clinical measurements within normal limits will improve Outcome: Not Progressing   Problem: Safety: Goal: Periods of time without injury will increase Outcome: Not Progressing   Problem: Education: Goal: Ability to make informed decisions regarding treatment will improve Outcome: Not Progressing   Problem: Coping: Goal: Coping ability will improve Outcome: Not Progressing   Problem: Health Behavior/Discharge Planning: Goal: Identification of resources available to assist in meeting health care needs will improve Outcome: Not Progressing   Problem: Medication: Goal: Compliance with prescribed medication regimen will improve Outcome: Not Progressing   Problem: Self-Concept: Goal: Ability to disclose and discuss suicidal ideas will improve Outcome: Not Progressing Goal: Will verbalize positive feelings about self Outcome: Not  Progressing   Problem: Activity: Goal: Will identify at least one activity in which they can participate Outcome: Not Progressing   Problem: Coping: Goal: Ability to identify and develop effective coping behavior will improve Outcome: Not Progressing Goal: Ability to interact with others will improve Outcome: Not Progressing Goal: Demonstration of participation in decision-making regarding own care will improve Outcome: Not Progressing Goal: Ability to use eye contact when communicating with others will improve Outcome: Not Progressing   Problem: Health Behavior/Discharge Planning: Goal: Identification of resources available to assist in meeting health care needs will improve Outcome: Not Progressing   Problem: Self-Concept: Goal: Will verbalize positive feelings about self Outcome: Not Progressing   Problem: Education: Goal: Ability to state activities that reduce stress will improve Outcome: Not Progressing   Problem: Coping: Goal: Ability to identify and develop effective coping behavior will improve Outcome: Not Progressing   Problem: Self-Concept: Goal: Ability to identify factors that promote anxiety will improve Outcome: Not Progressing Goal: Level of anxiety will decrease Outcome: Not Progressing Goal: Ability to modify response to factors that promote anxiety will improve Outcome: Not Progressing

## 2020-02-20 NOTE — [facility] Group Notes (Signed)
LCSW Group Therapy Note  02/20/2020 1:00 PM  Type of Therapy/Topic:  Group Therapy:  Balance in Life  Participation Level:  Did Not Attend  Description of Group:    This group will address the concept of balance and how it feels and looks when one is unbalanced. Patients will be encouraged to process areas in their lives that are out of balance and identify reasons for remaining unbalanced. Facilitators will guide patients in utilizing problem-solving interventions to address and correct the stressor making their life unbalanced. Understanding and applying boundaries will be explored and addressed for obtaining and maintaining a balanced life. Patients will be encouraged to explore ways to assertively make their unbalanced needs known to significant others in their lives, using other group members and facilitator for support and feedback.  Therapeutic Goals: 1. Patient will identify two or more emotions or situations they have that consume much of in their lives. 2. Patient will identify signs/triggers that life has become out of balance:  3. Patient will identify two ways to set boundaries in order to achieve balance in their lives:  4. Patient will demonstrate ability to communicate their needs through discussion and/or role plays  Summary of Patient Progress: X  Therapeutic Modalities:   Cognitive Behavioral Therapy Solution-Focused Therapy Assertiveness Training  Bellami Farrelly MSW, LCSW 02/20/2020 12:50 PM 

## 2020-02-21 NOTE — Plan of Care (Signed)
  Problem: Education: Goal: Knowledge of [facility] Education information/materials will improve Outcome: Not Progressing Goal: Emotional status will improve Outcome: Not Progressing Goal: Mental status will improve Outcome: Not Progressing Goal: Verbalization of understanding the information provided will improve Outcome: Not Progressing   Problem: Activity: Goal: Interest or engagement in activities will improve Outcome: Not Progressing Goal: Sleeping patterns will improve Outcome: Not Progressing   Problem: Coping: Goal: Ability to verbalize frustrations and anger appropriately will improve Outcome: Not Progressing Goal: Ability to demonstrate self-control will improve Outcome: Not Progressing   Problem: Health Behavior/Discharge Planning: Goal: Identification of resources available to assist in meeting health care needs will improve Outcome: Not Progressing Goal: Compliance with treatment plan for underlying cause of condition will improve Outcome: Not Progressing   Problem: Physical Regulation: Goal: Ability to maintain clinical measurements within normal limits will improve Outcome: Not Progressing   Problem: Safety: Goal: Periods of time without injury will increase Outcome: Not Progressing   Problem: Education: Goal: Ability to make informed decisions regarding treatment will improve Outcome: Not Progressing   Problem: Coping: Goal: Coping ability will improve Outcome: Not Progressing   Problem: Health Behavior/Discharge Planning: Goal: Identification of resources available to assist in meeting health care needs will improve Outcome: Not Progressing   Problem: Medication: Goal: Compliance with prescribed medication regimen will improve Outcome: Not Progressing   Problem: Self-Concept: Goal: Ability to disclose and discuss suicidal ideas will improve Outcome: Not Progressing Goal: Will verbalize positive feelings about self Outcome: Not  Progressing   Problem: Activity: Goal: Will identify at least one activity in which they can participate Outcome: Not Progressing   Problem: Coping: Goal: Ability to identify and develop effective coping behavior will improve Outcome: Not Progressing Goal: Ability to interact with others will improve Outcome: Not Progressing Goal: Demonstration of participation in decision-making regarding own care will improve Outcome: Not Progressing Goal: Ability to use eye contact when communicating with others will improve Outcome: Not Progressing   Problem: Health Behavior/Discharge Planning: Goal: Identification of resources available to assist in meeting health care needs will improve Outcome: Not Progressing   Problem: Self-Concept: Goal: Will verbalize positive feelings about self Outcome: Not Progressing   Problem: Education: Goal: Ability to state activities that reduce stress will improve Outcome: Not Progressing   Problem: Coping: Goal: Ability to identify and develop effective coping behavior will improve Outcome: Not Progressing   Problem: Self-Concept: Goal: Ability to identify factors that promote anxiety will improve Outcome: Not Progressing Goal: Level of anxiety will decrease Outcome: Not Progressing Goal: Ability to modify response to factors that promote anxiety will improve Outcome: Not Progressing   

## 2020-02-21 NOTE — Progress Notes (Signed)
Recreation Therapy Notes   Date: 02/21/2020  Time: 9:30 am   Location: Craft room    Behavioral response: N/A   Intervention Topic: Self-esteem   Discussion/Intervention: Patient did not attend group.   Clinical Observations/Feedback:  Patient did not attend group.   Diondre Pulis LRT/CTRS        Amairani Shuey 02/21/2020 11:30 AM

## 2020-02-21 NOTE — Plan of Care (Signed)
Patient denies SI / HI / AVH. Patient is minimal with assessment this morning. Patient continues to pace around unit. Patient is known to this nurse and has improved since last interview. Patient continues to be easily upset when redirected. Patient has moments of child like behavior. Patient is pleasant, and not agitated at this time. Patient continues to follow directions by staff.    Problem: Education: Goal: Knowledge of [facility] Education information/materials will improve Outcome: Not Progressing Goal: Mental status will improve Outcome: Not Progressing Goal: Verbalization of understanding the information provided will improve Outcome: Not Progressing

## 2020-02-21 NOTE — [facility] Group Notes (Signed)
Balance In Life 02/21/2020 9:30AM/1PM  Type of Therapy/Topic:  Group Therapy:  Balance in Life  Participation Level:  None  Description of Group:   This group will address the concept of balance and how it feels and looks when one is unbalanced. Patients will be encouraged to process areas in their lives that are out of balance and identify reasons for remaining unbalanced. Facilitators will guide patients in utilizing problem-solving interventions to address and correct the stressor making their life unbalanced. Understanding and applying boundaries will be explored and addressed for obtaining and maintaining a balanced life. Patients will be encouraged to explore ways to assertively make their unbalanced needs known to significant others in their lives, using other group members and facilitator for support and feedback.  Therapeutic Goals: 1. Patient will identify two or more emotions or situations they have that consume much of in their lives. 2. Patient will identify signs/triggers that life has become out of balance:  3. Patient will identify two ways to set boundaries in order to achieve balance in their lives:  4. Patient will demonstrate ability to communicate their needs through discussion and/or role plays  Summary of Patient Progress: Pt came to group but did not stay long. Pt left early and did not return.   Therapeutic Modalities:   Cognitive Behavioral Therapy Solution-Focused Therapy Assertiveness Training  Karyssa Amaral Philip Aspen, LCSW

## 2020-02-21 NOTE — Progress Notes (Signed)
Patient approached the nurses station requesting to get medications. Stating that she wanted to go to sleep early tonight. Medications provided and tolerated without incident.  She denies SI/HI/AV/HV anxiety and depression. She continues to walk the halls talking to herself. She asks can she leave the unit requesting to go home. She is easily redirectable and remains safe at this time with 15 minute safety checks.

## 2020-02-21 NOTE — Progress Notes (Signed)
[facility] MD Progress Note  02/21/2020 4:08 PM Madison Boyer  MRN:  [id number] Subjective: Follow-up for this 19 year old with psychosis.  Patient seen chart reviewed.  Reviewed situation with treatment team.  Patient is still pacing a lot and stays isolated.  During the interview she remained focused on discharge however she did not seem quite as disorganized.  She did not act out very immaturely although she still has odd stereotyped movements of her head.  Seems to be responding to internal stimuli but is better able to engage than previously.  No clear sign of any side effects from medicine Principal Problem: Schizoaffective disorder (HCC) Diagnosis: Principal Problem:   Schizoaffective disorder (HCC) Active Problems:   Dysmenorrhea  Total Time spent with patient: 30 minutes  Past Psychiatric History: Past history of previous hospitalizations with psychotic symptoms fairly recently  Past Medical History: History reviewed. No pertinent past medical history.  Past Surgical History:  Procedure Laterality Date  . APPENDECTOMY     Family History:  Family History  Problem Relation Age of Onset  . Healthy Mother   . Healthy Father   . Healthy Maternal Grandmother   . Diabetes Maternal Grandfather   . Healthy Paternal Grandmother   . Healthy Paternal Grandfather    Family Psychiatric  History: See previous Social History:  Social History   Substance and Sexual Activity  Alcohol Use Yes   Comment: occassionally     Social History   Substance and Sexual Activity  Drug Use Yes  . Types: Marijuana    Social History   Socioeconomic History  . Marital status: Single    Spouse name: Not on file  . Number of children: Not on file  . Years of education: Not on file  . Highest education level: Not on file  Occupational History  . Not on file  Tobacco Use  . Smoking status: Current Some Day Smoker  . Smokeless tobacco: Never Used  Substance and Sexual Activity  .  Alcohol use: Yes    Comment: occassionally  . Drug use: Yes    Types: Marijuana  . Sexual activity: Yes    Birth control/protection: Condom  Other Topics Concern  . Not on file  Social History Narrative  . Not on file   Social Determinants of Health   Financial Resource Strain:   . Difficulty of Paying Living Expenses:   Food Insecurity:   . Worried About Programme researcher, broadcasting/film/video in the Last Year:   . Barista in the Last Year:   Transportation Needs:   . Freight forwarder (Medical):   Marland Kitchen Lack of Transportation (Non-Medical):   Physical Activity:   . Days of Exercise per Week:   . Minutes of Exercise per Session:   Stress:   . Feeling of Stress :   Social Connections:   . Frequency of Communication with Friends and Family:   . Frequency of Social Gatherings with Friends and Family:   . Attends Religious Services:   . Active Member of Clubs or Organizations:   . Attends Banker Meetings:   Marland Kitchen Marital Status:    Additional Social History:                         Sleep: Fair  Appetite:  Fair  Current Medications: Current Facility-Administered Medications  Medication Dose Route Frequency Provider Last Rate Last Admin  . acetaminophen (TYLENOL) tablet 650 mg  650 mg Oral Q6H  PRN Deloria Lair, NP   650 mg at 02/19/20 1829  . alum & mag hydroxide-simeth (MAALOX/MYLANTA) 200-200-20 MG/5ML suspension 30 mL  30 mL Oral Q4H PRN Dixon, Rashaun M, NP      . benztropine (COGENTIN) tablet 0.5 mg  0.5 mg Oral BID Valeree Leidy, Madie Reno, MD   0.5 mg at 02/21/20 0752  . benztropine (COGENTIN) tablet 2 mg  2 mg Oral BID PRN Lavella Hammock, MD       Or  . benztropine mesylate (COGENTIN) injection 2 mg  2 mg Intramuscular BID PRN Lavella Hammock, MD      . haloperidol (HALDOL) tablet 5 mg  5 mg Oral BID Kenia Teagarden, Madie Reno, MD   5 mg at 02/21/20 0753  . hydrOXYzine (ATARAX/VISTARIL) tablet 25 mg  25 mg Oral TID PRN Deloria Lair, NP   25 mg at 02/21/20 1219   . magnesium hydroxide (MILK OF MAGNESIA) suspension 30 mL  30 mL Oral Daily PRN Dixon, Rashaun M, NP      . traZODone (DESYREL) tablet 50 mg  50 mg Oral QHS PRN Deloria Lair, NP   50 mg at 02/20/20 2109    Lab Results: No results found for this or any previous visit (from the past 48 hour(s)).  Blood Alcohol level:  Lab Results  Component Value Date   ETH <10 01/60/1093    Metabolic Disorder Labs: No results found for: HGBA1C, MPG No results found for: PROLACTIN No results found for: CHOL, TRIG, HDL, CHOLHDL, VLDL, LDLCALC  Physical Findings: AIMS: Facial and Oral Movements Muscles of Facial Expression: None, normal Lips and Perioral Area: None, normal Jaw: None, normal Tongue: None, normal,Extremity Movements Upper (arms, wrists, hands, fingers): None, normal Lower (legs, knees, ankles, toes): None, normal, Trunk Movements Neck, shoulders, hips: None, normal, Overall Severity Severity of abnormal movements (highest score from questions above): None, normal Incapacitation due to abnormal movements: None, normal Patient's awareness of abnormal movements (rate only patient's report): No Awareness, Dental Status Current problems with teeth and/or dentures?: No Does patient usually wear dentures?: No  CIWA:    COWS:     Musculoskeletal: Strength & Muscle Tone: within normal limits Gait & Station: normal Patient leans: N/A  Psychiatric Specialty Exam: Physical Exam  Nursing note and vitals reviewed. Constitutional: She appears well-developed and well-nourished.  HENT:  Head: Normocephalic and atraumatic.  Eyes: Pupils are equal, round, and reactive to light. Conjunctivae are normal.  Cardiovascular: Regular rhythm and normal heart sounds.  Respiratory: Effort normal.  GI: Soft.  Musculoskeletal:        General: Normal range of motion.     Cervical back: Normal range of motion.  Neurological: She is alert.  Skin: Skin is warm and dry.  Psychiatric: Her affect is  blunt. Her speech is delayed. She is slowed. Thought content is paranoid. Cognition and memory are impaired. She expresses inappropriate judgment. She expresses no homicidal and no suicidal ideation.    Review of Systems  Constitutional: Negative.   HENT: Negative.   Eyes: Negative.   Respiratory: Negative.   Cardiovascular: Negative.   Gastrointestinal: Negative.   Musculoskeletal: Negative.   Skin: Negative.   Neurological: Negative.   Psychiatric/Behavioral: The patient is nervous/anxious.     Blood pressure 111/74, pulse 92, temperature 98.1 F (36.7 C), temperature source Oral, resp. rate 17, height 5\' 2"  (1.575 m), weight 79.4 kg, SpO2 100 %.Body mass index is 32.01 kg/m.  General Appearance: Casual  Eye Contact:  Fair  Speech:  Slow  Volume:  Decreased  Mood:  Depressed and Dysphoric  Affect:  Non-Congruent  Thought Process:  Coherent  Orientation:  Full (Time, Place, and Person)  Thought Content:  Rumination and Tangential  Suicidal Thoughts:  No  Homicidal Thoughts:  No  Memory:  Immediate;   Fair Recent;   Fair Remote;   Fair  Judgement:  Impaired  Insight:  Shallow  Psychomotor Activity:  Decreased  Concentration:  Concentration: Poor  Recall:  Poor  Fund of Knowledge:  Fair  Language:  Fair  Akathisia:  No  Handed:  Right  AIMS (if indicated):     Assets:  Desire for Improvement Housing Social Support  ADL's:  Impaired  Cognition:  Impaired,  Mild  Sleep:  Number of Hours: 5.3     Treatment Plan Summary: Daily contact with patient to assess and evaluate symptoms and progress in treatment, Medication management and Plan Continue current Haldol dose.  Encourage patient to participate on the unit.  If continues to be stable we will look into possible long-acting injectable prior to discharge  Mordecai Rasmussen, MD 02/21/2020, 4:08 PM

## 2020-02-22 NOTE — Progress Notes (Signed)
Patient is alert and verbal. Patient has been in her room most of morning. Patient appears to be preoccupied with her thoughts. Patient is redirectable. Patient voices no concerns and no acute distress noted. Q 15 minute checks in progress and safety maintained on unit. Monitoring continues.

## 2020-02-22 NOTE — Progress Notes (Signed)
Patient alert and oriented x 2, she is confused and disorganized to place and situation, patient was often redirected for safety . Patient appears responding to internal stimuli and was ruminating most of the shift on discharge. Patient was offered emotional support, 15 minutes safety checks maintained will continue to monitor.

## 2020-02-22 NOTE — [facility] Group Notes (Signed)
[facility] LCSW Group Therapy Note  Date/Time:  02/22/2020  1:00PM  Type of Therapy and Topic:  Group Therapy:  Healthy and Unhealthy Supports  Participation Level:  Minimal   Description of Group:  Patients in this group were introduced to the idea of adding a variety of healthy supports to address the various needs in their lives.Patients discussed what additional healthy supports could be helpful in their recovery and wellness after discharge in order to prevent future hospitalizations.   An emphasis was placed on using counselor, doctor, therapy groups, 12-step groups, and problem-specific support groups to expand supports.  They also worked as a group on developing a specific plan for several patients to deal with unhealthy supports through boundary-setting, psychoeducation with loved ones, and even termination of relationships.   Therapeutic Goals:   1)  discuss importance of adding supports to stay well once out of the hospital  2)  compare healthy versus unhealthy supports and identify some examples of each  3)  generate ideas and descriptions of healthy supports that can be added  4)  offer mutual support about how to address unhealthy supports  5)  encourage active participation in and adherence to discharge plan    Summary of Patient Progress:  As an ice breaker, group members were asked to share one fun fact about themselves. Patient participated and identified that she likes to drive around and listen to music of rap stars. She was observed appearing to respond to internal stimuli. She left the group after attending for approximately 20 minutes. After leaving the group, she walked up and down the hall the remainder of group time. RN was informed of patient's behavior.   Therapeutic Modalities:   Motivational Interviewing Brief Solution-Focused Therapy   Roselyn Bering, MSW, LCSW

## 2020-02-22 NOTE — Progress Notes (Signed)
[facility] MD Progress Note  02/22/2020 12:14 PM Madison Boyer  MRN:  [id number]   Madison Boyer is a 19yo F with h/o Schizoaffective disorder, who was admitted due to exacerbation of psychosis.  Patient seen.  Chart reviewed. Patient discussed with nursing; no overnight events reported.Per night shift RN, "patient is confused and disorganized to place and situation, patient was often redirected for safety . Patient appears responding to internal stimuli and was ruminating most of the shift on discharge"  Subjective:  Patient observed pacing hallways and crying. She says "I am sad. I want to go home. I came here because I was shaking, I am fine now". She reports having thoughts of harming self because she is sad. When asked if she thinks it would be safe to be discharged if she is having such thoughts, she said "that`s true" and started to cry more. Denies thoughts of harming others. Denies hallucinations.    Principal Problem: Schizoaffective disorder (HCC) Diagnosis: Principal Problem:   Schizoaffective disorder (HCC) Active Problems:   Dysmenorrhea  Total Time spent with patient: 20 minutes  Past Psychiatric History: see H&P  Past Medical History: History reviewed. No pertinent past medical history.  Past Surgical History:  Procedure Laterality Date  . APPENDECTOMY     Family History:  Family History  Problem Relation Age of Onset  . Healthy Mother   . Healthy Father   . Healthy Maternal Grandmother   . Diabetes Maternal Grandfather   . Healthy Paternal Grandmother   . Healthy Paternal Grandfather    Family Psychiatric  History: see H&P Social History:  Social History   Substance and Sexual Activity  Alcohol Use Yes   Comment: occassionally     Social History   Substance and Sexual Activity  Drug Use Yes  . Types: Marijuana    Social History   Socioeconomic History  . Marital status: Single    Spouse name: Not on file  . Number of children: Not  on file  . Years of education: Not on file  . Highest education level: Not on file  Occupational History  . Not on file  Tobacco Use  . Smoking status: Current Some Day Smoker  . Smokeless tobacco: Never Used  Substance and Sexual Activity  . Alcohol use: Yes    Comment: occassionally  . Drug use: Yes    Types: Marijuana  . Sexual activity: Yes    Birth control/protection: Condom  Other Topics Concern  . Not on file  Social History Narrative  . Not on file   Social Determinants of Health   Financial Resource Strain:   . Difficulty of Paying Living Expenses:   Food Insecurity:   . Worried About Programme researcher, broadcasting/film/video in the Last Year:   . Barista in the Last Year:   Transportation Needs:   . Freight forwarder (Medical):   Marland Kitchen Lack of Transportation (Non-Medical):   Physical Activity:   . Days of Exercise per Week:   . Minutes of Exercise per Session:   Stress:   . Feeling of Stress :   Social Connections:   . Frequency of Communication with Friends and Family:   . Frequency of Social Gatherings with Friends and Family:   . Attends Religious Services:   . Active Member of Clubs or Organizations:   . Attends Banker Meetings:   Marland Kitchen Marital Status:    Additional Social History:  Sleep: Fair  Appetite:  Fair  Current Medications: Current Facility-Administered Medications  Medication Dose Route Frequency Provider Last Rate Last Admin  . acetaminophen (TYLENOL) tablet 650 mg  650 mg Oral Q6H PRN Deloria Lair, NP   650 mg at 02/19/20 1829  . alum & mag hydroxide-simeth (MAALOX/MYLANTA) 200-200-20 MG/5ML suspension 30 mL  30 mL Oral Q4H PRN Dixon, Rashaun M, NP      . benztropine (COGENTIN) tablet 0.5 mg  0.5 mg Oral BID Clapacs, John T, MD   0.5 mg at 02/22/20 0900  . benztropine (COGENTIN) tablet 2 mg  2 mg Oral BID PRN Lavella Hammock, MD       Or  . benztropine mesylate (COGENTIN) injection 2 mg  2 mg  Intramuscular BID PRN Lavella Hammock, MD      . haloperidol (HALDOL) tablet 5 mg  5 mg Oral BID Clapacs, Madie Reno, MD   5 mg at 02/22/20 0900  . hydrOXYzine (ATARAX/VISTARIL) tablet 25 mg  25 mg Oral TID PRN Deloria Lair, NP   25 mg at 02/22/20 1202  . magnesium hydroxide (MILK OF MAGNESIA) suspension 30 mL  30 mL Oral Daily PRN Dixon, Rashaun M, NP      . traZODone (DESYREL) tablet 50 mg  50 mg Oral QHS PRN Deloria Lair, NP   50 mg at 02/21/20 2130    Lab Results: No results found for this or any previous visit (from the past 48 hour(s)).  Blood Alcohol level:  Lab Results  Component Value Date   ETH <10 16/10/930    Metabolic Disorder Labs: No results found for: HGBA1C, MPG No results found for: PROLACTIN No results found for: CHOL, TRIG, HDL, CHOLHDL, VLDL, LDLCALC  Physical Findings: AIMS: Facial and Oral Movements Muscles of Facial Expression: None, normal Lips and Perioral Area: None, normal Jaw: None, normal Tongue: None, normal,Extremity Movements Upper (arms, wrists, hands, fingers): None, normal Lower (legs, knees, ankles, toes): None, normal, Trunk Movements Neck, shoulders, hips: None, normal, Overall Severity Severity of abnormal movements (highest score from questions above): None, normal Incapacitation due to abnormal movements: None, normal Patient's awareness of abnormal movements (rate only patient's report): No Awareness, Dental Status Current problems with teeth and/or dentures?: No Does patient usually wear dentures?: No  CIWA:    COWS:     Musculoskeletal: Strength & Muscle Tone: within normal limits Gait & Station: normal Patient leans: N/A  Psychiatric Specialty Exam: Physical Exam  Review of Systems  Blood pressure 111/74, pulse 67, temperature 98.4 F (36.9 C), temperature source Oral, resp. rate 17, height 5\' 2"  (1.575 m), weight 79.4 kg, SpO2 100 %.Body mass index is 32.01 kg/m.  General Appearance: Casual  Eye Contact:  Minimal   Speech:  Slow  Volume:  Decreased  Mood:  Depressed and Dysphoric  Affect:  Labile  Thought Process:  Coherent  Orientation:  Full (Time, Place, and Person)  Thought Content:  Rumination and Abstract Reasoning  Suicidal Thoughts:  Yes.  without intent/plan  Homicidal Thoughts:  No  Memory:  Immediate;   Fair  Judgement:  Poor  Insight:  Shallow  Psychomotor Activity:  Decreased  Concentration:  Concentration: Fair  Recall:  [employer] of Knowledge:  Fair  Language:  Fair  Akathisia:  No  Handed:  Right  AIMS (if indicated):     Assets:  Desire for Improvement Housing Physical Health Social Support  ADL's:  Impaired  Cognition:  Impaired,  Mild  Sleep:  Number of Hours: 5.3     Treatment Plan Summary: 19yo F with h/o Schizoaffective disorder, who was admitted due to exacerbation of psychosis. Patient still appears internally-preoccupied, but seems to be improving. She is compliant with medications and no changes are made today in her plan. Main treatment team is considering possible long-acting injectable prior to discharge. -continue inpatient psych admission; 15-minute checks; daily contact with patient to assess and evaluate symptoms and progress in treatment; psychoeducation. -continue psych medications: Haldol 5mg  PO BID for psychosis, Benztropin 0.5mg  PO BID for EPS prophx; Trazodone 50mg  PO QHS PRN sleep; Hydroxyzine 25mg  PO TID PRN anxiety.   -Disposition: to be determined next week.  , MD 02/22/2020, 12:14 PM

## 2020-02-23 NOTE — Plan of Care (Signed)
Cooperative with treatment,  she had minimal interaction with peers and staff on unit, she still appears to be somewhat apprehensive of staff and peers. In bed resting at this time with no complaints to report on shift.

## 2020-02-23 NOTE — Progress Notes (Signed)
D:Patient observed on unit walking in the hallway and preoccupied with thoughts. Patient does appear to be responding to internal stimuli at times. Patient denies SI/HI.  A:Patient given scheduled medications. Patient encouraged to attend groups. Patient provided support and encouragement.  R:Patient taking medications. Q 15 checks in progress and safety maintained on unit. Monitoring continues.

## 2020-02-23 NOTE — Progress Notes (Signed)
[facility] MD Progress Note  02/23/2020 1:10 PM Madison Boyer  MRN:  [id number]   Ms. Madison Boyer is a 19yo F with h/o Schizoaffective disorder, who was admitted due to exacerbation of psychosis.  Patient seen.  Chart reviewed. Patient discussed with nursing; no overnight events reported.  Subjective:  Patient continues to report feel;ing homesick. She denies any complaints, denies suicidal or homicidal thoughts, denies any hallucinations. She reports "feeling weird when walking" and cannot explain. She observed walking in the hallway on several occasions without any obvious difficulties. She was observed making bizarre movements with her arms when walking. Denies symptoms at rest.  Principal Problem: Schizoaffective disorder ([place]) Diagnosis: Principal Problem:   Schizoaffective disorder ([place]) Active Problems:   Dysmenorrhea  Total Time spent with patient: 20 minutes  Past Psychiatric History: see H&P  Past Medical History: History reviewed. No pertinent past medical history.  Past Surgical History:  Procedure Laterality Date  . APPENDECTOMY     Family History:  Family History  Problem Relation Age of Onset  . Healthy Mother   . Healthy Father   . Healthy Maternal Grandmother   . Diabetes Maternal Grandfather   . Healthy Paternal Grandmother   . Healthy Paternal Grandfather    Family Psychiatric  History: see H&P Social History:  Social History   Substance and Sexual Activity  Alcohol Use Yes   Comment: occassionally     Social History   Substance and Sexual Activity  Drug Use Yes  . Types: Marijuana    Social History   Socioeconomic History  . Marital status: Single    Spouse name: Not on file  . Number of children: Not on file  . Years of education: Not on file  . Highest education level: Not on file  Occupational History  . Not on file  Tobacco Use  . Smoking status: Current Some Day Smoker  . Smokeless tobacco: Never Used  Substance and  Sexual Activity  . Alcohol use: Yes    Comment: occassionally  . Drug use: Yes    Types: Marijuana  . Sexual activity: Yes    Birth control/protection: Condom  Other Topics Concern  . Not on file  Social History Narrative  . Not on file   Social Determinants of Health   Financial Resource Strain:   . Difficulty of Paying Living Expenses:   Food Insecurity:   . Worried About Charity fundraiser in the Last Year:   . Arboriculturist in the Last Year:   Transportation Needs:   . Film/video editor (Medical):   Marland Kitchen Lack of Transportation (Non-Medical):   Physical Activity:   . Days of Exercise per Week:   . Minutes of Exercise per Session:   Stress:   . Feeling of Stress :   Social Connections:   . Frequency of Communication with Friends and Family:   . Frequency of Social Gatherings with Friends and Family:   . Attends Religious Services:   . Active Member of Clubs or Organizations:   . Attends Archivist Meetings:   Marland Kitchen Marital Status:    Additional Social History:                         Sleep: Fair  Appetite:  Fair  Current Medications: Current Facility-Administered Medications  Medication Dose Route Frequency Provider Last Rate Last Admin  . acetaminophen (TYLENOL) tablet 650 mg  650 mg Oral Q6H PRN Madison Boyer, Madison M,  NP   650 mg at 02/19/20 1829  . alum & mag hydroxide-simeth (MAALOX/MYLANTA) 200-200-20 MG/5ML suspension 30 mL  30 mL Oral Q4H PRN Madison Boyer, Madison M, NP      . benztropine (COGENTIN) tablet 0.5 mg  0.5 mg Oral BID Madison Boyer, Madison T, MD   0.5 mg at 02/23/20 0900  . benztropine (COGENTIN) tablet 2 mg  2 mg Oral BID PRN Madison Craft, MD       Or  . benztropine mesylate (COGENTIN) injection 2 mg  2 mg Intramuscular BID PRN Madison Craft, MD      . haloperidol (HALDOL) tablet 5 mg  5 mg Oral BID Madison Boyer, Madison Denmark, MD   5 mg at 02/23/20 0900  . hydrOXYzine (ATARAX/VISTARIL) tablet 25 mg  25 mg Oral TID PRN Madison Lesch, NP   25 mg  at 02/22/20 1202  . magnesium hydroxide (MILK OF MAGNESIA) suspension 30 mL  30 mL Oral Daily PRN Madison Boyer, Madison M, NP      . traZODone (DESYREL) tablet 50 mg  50 mg Oral QHS PRN Madison Lesch, NP   50 mg at 02/21/20 2130    Lab Results: No results found for this or any previous visit (from the past 48 hour(s)).  Blood Alcohol level:  Lab Results  Component Value Date   ETH <10 02/15/2020    Metabolic Disorder Labs: No results found for: HGBA1C, MPG No results found for: PROLACTIN No results found for: CHOL, TRIG, HDL, CHOLHDL, VLDL, LDLCALC  Physical Findings: AIMS: Facial and Oral Movements Muscles of Facial Expression: None, normal Lips and Perioral Area: None, normal Jaw: None, normal Tongue: None, normal,Extremity Movements Upper (arms, wrists, hands, fingers): None, normal Lower (legs, knees, ankles, toes): None, normal, Trunk Movements Neck, shoulders, hips: None, normal, Overall Severity Severity of abnormal movements (highest score from questions above): None, normal Incapacitation due to abnormal movements: None, normal Patient's awareness of abnormal movements (rate only patient's report): No Awareness, Dental Status Current problems with teeth and/or dentures?: No Does patient usually wear dentures?: No  CIWA:    COWS:     Musculoskeletal: Strength & Muscle Tone: within normal limits Gait & Station: normal Patient leans: N/A  Psychiatric Specialty Exam: Physical Exam   Review of Systems   Blood pressure 120/81, pulse 85, temperature 98.6 F (37 C), temperature source Oral, resp. rate 18, height 5\' 2"  (1.575 Boyer), weight 79.4 kg, SpO2 100 %.Body mass index is 32.01 kg/Boyer.  General Appearance: Casual  Eye Contact:  Minimal  Speech:  Slow  Volume:  Decreased  Mood:  Depressed and Dysphoric  Affect:  Labile  Thought Process:  Coherent  Orientation:  Full (Time, Place, and Person)  Thought Content:  Rumination and Abstract Reasoning  Suicidal Thoughts:   Yes.  without intent/plan  Homicidal Thoughts:  No  Memory:  Immediate;   Fair  Judgement:  Poor  Insight:  Shallow  Psychomotor Activity:  Decreased  Concentration:  Concentration: Fair  Recall:  of Knowledge:  Fair  Language:  Fair  Akathisia:  No  Handed:  Right  AIMS (if indicated):     Assets:  Desire for Improvement Housing Physical Health Social Support  ADL's:  Impaired  Cognition:  Impaired,  Mild  Sleep:  Number of Hours: 6.5     Treatment Plan Summary: 19yo F with h/o Schizoaffective disorder, who was admitted due to exacerbation of psychosis. Patient still appears internally-preoccupied and continues bizarre arm movements, but  seems to be improving overall. She is compliant with medications and no changes are made today in her plan. Main treatment team is considering possible long-acting injectable prior to discharge. -continue inpatient psych admission; 15-minute checks; daily contact with patient to assess and evaluate symptoms and progress in treatment; psychoeducation. -continue psych medications: Haldol 5mg  PO BID for psychosis, Benztropin 0.5mg  PO BID for EPS prophx; Trazodone 50mg  PO QHS PRN sleep; Hydroxyzine 25mg  PO TID PRN anxiety.   -Disposition: to be determined next week.  , MD 02/23/2020, 1:10 PM

## 2020-02-23 NOTE — Tx Team (Signed)
Interdisciplinary Treatment and Diagnostic Plan Update  02/23/2020 Time of Session: 830am Madison Boyer MRN: [id number]  Principal Diagnosis: Schizoaffective disorder [facility])  Secondary Diagnoses: Principal Problem:   Schizoaffective disorder (HCC) Active Problems:   Dysmenorrhea   Current Medications:  Current Facility-Administered Medications  Medication Dose Route Frequency Provider Last Rate Last Admin  . acetaminophen (TYLENOL) tablet 650 mg  650 mg Oral Q6H PRN Jearld Lesch, NP   650 mg at 02/19/20 1829  . alum & mag hydroxide-simeth (MAALOX/MYLANTA) 200-200-20 MG/5ML suspension 30 mL  30 mL Oral Q4H PRN Dixon, Rashaun M, NP      . benztropine (COGENTIN) tablet 0.5 mg  0.5 mg Oral BID Clapacs, John T, MD   0.5 mg at 02/22/20 1800  . benztropine (COGENTIN) tablet 2 mg  2 mg Oral BID PRN Mariel Craft, MD       Or  . benztropine mesylate (COGENTIN) injection 2 mg  2 mg Intramuscular BID PRN Mariel Craft, MD      . haloperidol (HALDOL) tablet 5 mg  5 mg Oral BID Clapacs, Jackquline Denmark, MD   5 mg at 02/22/20 1800  . hydrOXYzine (ATARAX/VISTARIL) tablet 25 mg  25 mg Oral TID PRN Jearld Lesch, NP   25 mg at 02/22/20 1202  . magnesium hydroxide (MILK OF MAGNESIA) suspension 30 mL  30 mL Oral Daily PRN Jearld Lesch, NP      . traZODone (DESYREL) tablet 50 mg  50 mg Oral QHS PRN Jearld Lesch, NP   50 mg at 02/21/20 2130   PTA Medications: Medications Prior to Admission  Medication Sig Dispense Refill Last Dose  . norgestimate-ethinyl estradiol (ORTHO-CYCLEN) 0.25-35 MG-MCG tablet Take 1 tablet by mouth daily. 1 Package 11     Patient Stressors: Medication change or noncompliance Substance abuse  Patient Strengths: Supportive family/friends  Treatment Modalities: Medication Management, Group therapy, Case management,  1 to 1 session with clinician, Psychoeducation, Recreational therapy.   Physician Treatment Plan for Primary Diagnosis:  Schizoaffective disorder (HCC) Long Term Goal(s): Improvement in symptoms so as ready for discharge Improvement in symptoms so as ready for discharge   Short Term Goals: Ability to identify changes in lifestyle to reduce recurrence of condition will improve Ability to verbalize feelings will improve Ability to demonstrate self-control will improve Ability to identify and develop effective coping behaviors will improve Ability to maintain clinical measurements within normal limits will improve Compliance with prescribed medications will improve Ability to identify triggers associated with substance abuse/mental health issues will improve Ability to identify changes in lifestyle to reduce recurrence of condition will improve Ability to verbalize feelings will improve Ability to demonstrate self-control will improve Ability to identify and develop effective coping behaviors will improve Ability to maintain clinical measurements within normal limits will improve Compliance with prescribed medications will improve Ability to identify triggers associated with substance abuse/mental health issues will improve  Medication Management: Evaluate patient's response, side effects, and tolerance of medication regimen.  Therapeutic Interventions: 1 to 1 sessions, Unit Group sessions and Medication administration.  Evaluation of Outcomes: Progressing  Physician Treatment Plan for Secondary Diagnosis: Principal Problem:   Schizoaffective disorder (HCC) Active Problems:   Dysmenorrhea  Long Term Goal(s): Improvement in symptoms so as ready for discharge Improvement in symptoms so as ready for discharge   Short Term Goals: Ability to identify changes in lifestyle to reduce recurrence of condition will improve Ability to verbalize feelings will improve Ability to demonstrate self-control will improve  Ability to identify and develop effective coping behaviors will improve Ability to maintain clinical  measurements within normal limits will improve Compliance with prescribed medications will improve Ability to identify triggers associated with substance abuse/mental health issues will improve Ability to identify changes in lifestyle to reduce recurrence of condition will improve Ability to verbalize feelings will improve Ability to demonstrate self-control will improve Ability to identify and develop effective coping behaviors will improve Ability to maintain clinical measurements within normal limits will improve Compliance with prescribed medications will improve Ability to identify triggers associated with substance abuse/mental health issues will improve     Medication Management: Evaluate patient's response, side effects, and tolerance of medication regimen.  Therapeutic Interventions: 1 to 1 sessions, Unit Group sessions and Medication administration.  Evaluation of Outcomes: Progressing   RN Treatment Plan for Primary Diagnosis: Schizoaffective disorder (HCC) Long Term Goal(s): Knowledge of disease and therapeutic regimen to maintain health will improve  Short Term Goals: Ability to participate in decision making will improve, Ability to verbalize feelings will improve, Ability to identify and develop effective coping behaviors will improve and Compliance with prescribed medications will improve  Medication Management: RN will administer medications as ordered by provider, will assess and evaluate patient's response and provide education to patient for prescribed medication. RN will report any adverse and/or side effects to prescribing provider.  Therapeutic Interventions: 1 on 1 counseling sessions, Psychoeducation, Medication administration, Evaluate responses to treatment, Monitor vital signs and CBGs as ordered, Perform/monitor CIWA, COWS, AIMS and Fall Risk screenings as ordered, Perform wound care treatments as ordered.  Evaluation of Outcomes: Progressing   LCSW  Treatment Plan for Primary Diagnosis: Schizoaffective disorder (HCC) Long Term Goal(s): Safe transition to appropriate next level of care at discharge, Engage patient in therapeutic group addressing interpersonal concerns.  Short Term Goals: Engage patient in aftercare planning with referrals and resources  Therapeutic Interventions: Assess for all discharge needs, 1 to 1 time with Social worker, Explore available resources and support systems, Assess for adequacy in community support network, Educate family and significant other(s) on suicide prevention, Complete Psychosocial Assessment, Interpersonal group therapy.  Evaluation of Outcomes: Progressing   Progress in Treatment: Attending groups: Yes. Participating in groups: No. Taking medication as prescribed: Yes. Toleration medication: Yes. Family/Significant other contact made: Yes, individual(s) contacted:  Karl Pock, father Patient understands diagnosis: No. Discussing patient identified problems/goals with staff: Yes. Medical problems stabilized or resolved: No. Denies suicidal/homicidal ideation: Yes. Issues/concerns per patient self-inventory: No. Other: NA  New problem(s) identified: No, Describe:  None reported  New Short Term/Long Term Goal(s):Attend outpatient treatment, take medication as prescribed, develop and implement healthy coping methods  Patient Goals:  "To get better'  Discharge Plan or Barriers: Pt will return home and follow up with outpatient treatment. Update 02/23/20-Pt request to follow up at [facility], where she is an existing patient.  Reason for Continuation of Hospitalization: Delusions  Medication stabilization  Estimated Length of Stay:TBD  Recreational Therapy: Patient Stressors: Family, Friends Patient Goal: Patient will identify 3 positive coping skills strategies to use post d/c within 5 recreation therapy group sessions  Attendees: Patient: 02/23/2020 8:39 AM  Physician:  Mordecai Rasmussen 02/23/2020 8:39 AM  Nursing:  02/23/2020 8:39 AM  RN Care Manager: 02/23/2020 8:39 AM  Social Worker: Lowella Dandy  02/23/2020 8:39 AM  Recreational Therapist:  02/23/2020 8:39 AM  Other:  02/23/2020 8:39 AM  Other:  02/23/2020 8:39 AM  Other: 02/23/2020 8:39 AM    Scribe for Treatment  Team: Yvette Rack, LCSW 02/23/2020 8:39 AM

## 2020-02-24 MED ORDER — HALOPERIDOL DECANOATE 100 MG/ML IM SOLN
100.0000 mg | INTRAMUSCULAR | Status: DC
  Administered 2020-02-24: 100 mg via INTRAMUSCULAR
  Filled 2020-02-24: qty 1

## 2020-02-24 NOTE — [facility] Group Notes (Signed)
[facility] Group Notes:  (Nursing/MHT/Case Management/Adjunct)  Date:  02/24/2020  Time:  1:32 PM  Type of Therapy:  Psychoeducational Skills  Participation Level:  Did Not Attend    Madison Boyer 02/24/2020, 1:32 PM

## 2020-02-24 NOTE — Progress Notes (Signed)
Pt is looking forward to discharge tomorrow. Torrie Mayers RN

## 2020-02-24 NOTE — Progress Notes (Signed)
[facility] MD Progress Note  02/24/2020 5:29 PM Madison Boyer  MRN:  [id number] Subjective: Patient seen chart reviewed.  Young woman with probably schizophrenia.  Patient is much more lucid today.  Not doing her shaking not agitated.  Walks around the unit with a flat look on her face but is able to engage in realistic conversation about going home.  No signs of any side effects Principal Problem: Schizoaffective disorder (HCC) Diagnosis: Principal Problem:   Schizoaffective disorder (HCC) Active Problems:   Dysmenorrhea  Total Time spent with patient: 30 minutes  Past Psychiatric History: Recent new onset psychosis  Past Medical History: History reviewed. No pertinent past medical history.  Past Surgical History:  Procedure Laterality Date  . APPENDECTOMY     Family History:  Family History  Problem Relation Age of Onset  . Healthy Mother   . Healthy Father   . Healthy Maternal Grandmother   . Diabetes Maternal Grandfather   . Healthy Paternal Grandmother   . Healthy Paternal Grandfather    Family Psychiatric  History: See previous Social History:  Social History   Substance and Sexual Activity  Alcohol Use Yes   Comment: occassionally     Social History   Substance and Sexual Activity  Drug Use Yes  . Types: Marijuana    Social History   Socioeconomic History  . Marital status: Single    Spouse name: Not on file  . Number of children: Not on file  . Years of education: Not on file  . Highest education level: Not on file  Occupational History  . Not on file  Tobacco Use  . Smoking status: Current Some Day Smoker  . Smokeless tobacco: Never Used  Substance and Sexual Activity  . Alcohol use: Yes    Comment: occassionally  . Drug use: Yes    Types: Marijuana  . Sexual activity: Yes    Birth control/protection: Condom  Other Topics Concern  . Not on file  Social History Narrative  . Not on file   Social Determinants of Health   Financial  Resource Strain:   . Difficulty of Paying Living Expenses:   Food Insecurity:   . Worried About Charity fundraiser in the Last Year:   . Arboriculturist in the Last Year:   Transportation Needs:   . Film/video editor (Medical):   Marland Kitchen Lack of Transportation (Non-Medical):   Physical Activity:   . Days of Exercise per Week:   . Minutes of Exercise per Session:   Stress:   . Feeling of Stress :   Social Connections:   . Frequency of Communication with Friends and Family:   . Frequency of Social Gatherings with Friends and Family:   . Attends Religious Services:   . Active Member of Clubs or Organizations:   . Attends Archivist Meetings:   Marland Kitchen Marital Status:    Additional Social History:                         Sleep: Fair  Appetite:  Fair  Current Medications: Current Facility-Administered Medications  Medication Dose Route Frequency Provider Last Rate Last Admin  . acetaminophen (TYLENOL) tablet 650 mg  650 mg Oral Q6H PRN Deloria Lair, NP   650 mg at 02/19/20 1829  . alum & mag hydroxide-simeth (MAALOX/MYLANTA) 200-200-20 MG/5ML suspension 30 mL  30 mL Oral Q4H PRN Deloria Lair, NP      .  benztropine (COGENTIN) tablet 0.5 mg  0.5 mg Oral BID Caylen Yardley, Jackquline Denmark, MD   0.5 mg at 02/24/20 1628  . benztropine (COGENTIN) tablet 2 mg  2 mg Oral BID PRN Mariel Craft, MD       Or  . benztropine mesylate (COGENTIN) injection 2 mg  2 mg Intramuscular BID PRN Mariel Craft, MD      . haloperidol (HALDOL) tablet 5 mg  5 mg Oral BID Kirill Chatterjee T, MD   5 mg at 02/24/20 1628  . haloperidol decanoate (HALDOL DECANOATE) 100 MG/ML injection 100 mg  100 mg Intramuscular Q30 days Brenn Deziel T, MD   100 mg at 02/24/20 1614  . hydrOXYzine (ATARAX/VISTARIL) tablet 25 mg  25 mg Oral TID PRN Jearld Lesch, NP   25 mg at 02/23/20 2137  . magnesium hydroxide (MILK OF MAGNESIA) suspension 30 mL  30 mL Oral Daily PRN Dixon, Rashaun M, NP      . traZODone  (DESYREL) tablet 50 mg  50 mg Oral QHS PRN Jearld Lesch, NP   50 mg at 02/23/20 2137    Lab Results: No results found for this or any previous visit (from the past 48 hour(s)).  Blood Alcohol level:  Lab Results  Component Value Date   ETH <10 02/15/2020    Metabolic Disorder Labs: No results found for: HGBA1C, MPG No results found for: PROLACTIN No results found for: CHOL, TRIG, HDL, CHOLHDL, VLDL, LDLCALC  Physical Findings: AIMS: Facial and Oral Movements Muscles of Facial Expression: None, normal Lips and Perioral Area: None, normal Jaw: None, normal Tongue: None, normal,Extremity Movements Upper (arms, wrists, hands, fingers): None, normal Lower (legs, knees, ankles, toes): None, normal, Trunk Movements Neck, shoulders, hips: None, normal, Overall Severity Severity of abnormal movements (highest score from questions above): None, normal Incapacitation due to abnormal movements: None, normal Patient's awareness of abnormal movements (rate only patient's report): No Awareness, Dental Status Current problems with teeth and/or dentures?: No Does patient usually wear dentures?: No  CIWA:    COWS:     Musculoskeletal: Strength & Muscle Tone: within normal limits Gait & Station: normal Patient leans: N/A  Psychiatric Specialty Exam: Physical Exam  Nursing note and vitals reviewed. Constitutional: She appears well-developed and well-nourished.  HENT:  Head: Normocephalic and atraumatic.  Eyes: Pupils are equal, round, and reactive to light. Conjunctivae are normal.  Cardiovascular: Regular rhythm and normal heart sounds.  Respiratory: Effort normal.  GI: Soft.  Musculoskeletal:        General: Normal range of motion.     Cervical back: Normal range of motion.  Neurological: She is alert.  Skin: Skin is warm and dry.  Psychiatric: Her affect is blunt. Her speech is delayed. She is slowed. Cognition and memory are impaired. She expresses inappropriate judgment. She  expresses no suicidal ideation.    Review of Systems  Constitutional: Negative.   HENT: Negative.   Eyes: Negative.   Respiratory: Negative.   Cardiovascular: Negative.   Gastrointestinal: Negative.   Musculoskeletal: Negative.   Skin: Negative.   Neurological: Negative.   Psychiatric/Behavioral: Positive for confusion.    Blood pressure 114/77, pulse 68, temperature 98.4 F (36.9 C), temperature source Oral, resp. rate 18, height 5\' 2"  (1.575 m), weight 79.4 kg, SpO2 100 %.Body mass index is 32.01 kg/m.  General Appearance: Casual  Eye Contact:  Good  Speech:  Clear and Coherent  Volume:  Normal  Mood:  Euthymic  Affect:  Constricted  Thought  Process:  Goal Directed  Orientation:  Full (Time, Place, and Person)  Thought Content:  Logical  Suicidal Thoughts:  No  Homicidal Thoughts:  No  Memory:  Immediate;   Fair Recent;   Fair Remote;   Fair  Judgement:  Fair  Insight:  Fair  Psychomotor Activity:  Normal  Concentration:  Concentration: Fair  Recall:  [employer] of Knowledge:  Fair  Language:  Good  Akathisia:  No  Handed:  Right  AIMS (if indicated):     Assets:  Desire for Improvement  ADL's:  Intact  Cognition:  WNL  Sleep:  Number of Hours: 7     Treatment Plan Summary: Daily contact with patient to assess and evaluate symptoms and progress in treatment, Medication management and Plan Patient agreed to allow for Haldol decanoate shot.  No change to oral medicine.  Reviewed with her the way that decanoate shots can work.  Probable discharge tomorrow  Mordecai Rasmussen, MD 02/24/2020, 5:29 PM

## 2020-02-24 NOTE — Progress Notes (Signed)
Patient alert and oriented x 3, patient appears responding to internal stimuli and was les irritable no pacing this evening, receptive to staff and compliant with evening medication regimen. Patient was offered emotional support, 15 minutes safety checks maintained will continue to monitor.

## 2020-02-24 NOTE — [facility] Group Notes (Signed)
[facility] Group Notes:  (Nursing/MHT/Case Management/Adjunct)  Date:  02/24/2020  Time:  9:47 AM  Type of Therapy:  COMMUNITY MEETING  Participation Level:  Minimal  Participation Quality:  Appropriate  Affect:  Appropriate  Cognitive:  Appropriate  Insight:  Appropriate  Engagement in Group:  Limited  Modes of Intervention:  Activity, Discussion and Education  Summary of Progress/Problems:  Madison Boyer 02/24/2020, 9:47 AM

## 2020-02-24 NOTE — Progress Notes (Signed)
Pt has been sleeping in her room this morning. She has been calm and coopertaive. She did not attend group this morning. Torrie Mayers RN

## 2020-02-24 NOTE — [facility] Group Notes (Signed)
LCSW Group Therapy Note   02/24/2020 2:40 PM  Type of Therapy and Topic:  Group Therapy:  Overcoming Obstacles   Participation Level:  Minimal   Description of Group:    In this group patients will be encouraged to explore what they see as obstacles to their own wellness and recovery. They will be guided to discuss their thoughts, feelings, and behaviors related to these obstacles. The group will process together ways to cope with barriers, with attention given to specific choices patients can make. Each patient will be challenged to identify changes they are motivated to make in order to overcome their obstacles. This group will be process-oriented, with patients participating in exploration of their own experiences as well as giving and receiving support and challenge from other group members.   Therapeutic Goals: 1. Patient will identify personal and current obstacles as they relate to admission. 2. Patient will identify barriers that currently interfere with their wellness or overcoming obstacles.  3. Patient will identify feelings, thought process and behaviors related to these barriers. 4. Patient will identify two changes they are willing to make to overcome these obstacles:      Summary of Patient Progress Patient was present for group, however did not engage in group discussions.     Therapeutic Modalities:   Cognitive Behavioral Therapy Solution Focused Therapy Motivational Interviewing Relapse Prevention Therapy  Penni Homans, MSW, LCSW 02/24/2020 2:40 PM

## 2020-02-24 NOTE — Plan of Care (Signed)
Pt rates depression 10/10 and anxiety 5/10. Pt denies SI, HI and AVH. Pt was educated on care plan and verbalizes understanding. Pt was encouraged to attend groups. Torrie Mayers RN Problem: Education: Goal: Knowledge of [facility] Education information/materials will improve Outcome: Progressing Goal: Emotional status will improve Outcome: Progressing Goal: Mental status will improve Outcome: Progressing Goal: Verbalization of understanding the information provided will improve Outcome: Progressing   Problem: Activity: Goal: Interest or engagement in activities will improve Outcome: Progressing Goal: Sleeping patterns will improve Outcome: Progressing   Problem: Coping: Goal: Ability to verbalize frustrations and anger appropriately will improve Outcome: Progressing Goal: Ability to demonstrate self-control will improve Outcome: Progressing   Problem: Health Behavior/Discharge Planning: Goal: Identification of resources available to assist in meeting health care needs will improve Outcome: Progressing Goal: Compliance with treatment plan for underlying cause of condition will improve Outcome: Progressing   Problem: Physical Regulation: Goal: Ability to maintain clinical measurements within normal limits will improve Outcome: Progressing   Problem: Safety: Goal: Periods of time without injury will increase Outcome: Progressing   Problem: Education: Goal: Ability to make informed decisions regarding treatment will improve Outcome: Progressing   Problem: Coping: Goal: Coping ability will improve Outcome: Progressing   Problem: Health Behavior/Discharge Planning: Goal: Identification of resources available to assist in meeting health care needs will improve Outcome: Progressing   Problem: Medication: Goal: Compliance with prescribed medication regimen will improve Outcome: Progressing   Problem: Self-Concept: Goal: Ability to disclose and discuss suicidal  ideas will improve Outcome: Progressing Goal: Will verbalize positive feelings about self Outcome: Progressing   Problem: Activity: Goal: Will identify at least one activity in which they can participate Outcome: Progressing   Problem: Coping: Goal: Ability to identify and develop effective coping behavior will improve Outcome: Progressing Goal: Ability to interact with others will improve Outcome: Progressing Goal: Demonstration of participation in decision-making regarding own care will improve Outcome: Progressing Goal: Ability to use eye contact when communicating with others will improve Outcome: Progressing   Problem: Health Behavior/Discharge Planning: Goal: Identification of resources available to assist in meeting health care needs will improve Outcome: Progressing   Problem: Self-Concept: Goal: Will verbalize positive feelings about self Outcome: Progressing   Problem: Education: Goal: Ability to state activities that reduce stress will improve Outcome: Progressing   Problem: Coping: Goal: Ability to identify and develop effective coping behavior will improve Outcome: Progressing   Problem: Self-Concept: Goal: Ability to identify factors that promote anxiety will improve Outcome: Progressing Goal: Level of anxiety will decrease Outcome: Progressing Goal: Ability to modify response to factors that promote anxiety will improve Outcome: Progressing

## 2020-02-24 NOTE — [facility] Group Notes (Signed)
[facility] Group Notes:  (Nursing/MHT/Case Management/Adjunct)  Date:  02/24/2020  Time:  8:57 PM  Type of Therapy:  Group Therapy  Participation Level:  Active  Participation Quality:  Redirectable and sitting on countertop and than on the floor.  Affect:  Appropriate  Cognitive:  Alert  Insight:  Good  Engagement in Group:  Engaged  Modes of Intervention:  Support  Summary of Progress/Problems:  Madison Boyer 02/24/2020, 8:57 PM

## 2020-02-24 NOTE — Plan of Care (Signed)
D- Patient alert and oriented. Patient presents in a pleasant mood on assessment, walking the halls, stating that she had a pretty good day today. She denies any signs/symptoms of depression, however, she endorsed "a little" anxiety, but could not pinpoint why she's feeling this way. Patient also denies SI, HI, AVH, and pain at this time.  A- Support and encouragement provided.  Routine safety checks conducted every 15 minutes.  Patient informed to notify staff with problems or concerns.  R- Patient contracts for safety at this time. Patient compliant with treatment plan. Patient receptive, calm, and cooperative. Patient remains safe at this time.  Problem: Education: Goal: Knowledge of [facility] Education information/materials will improve Outcome: Progressing Goal: Emotional status will improve Outcome: Progressing Goal: Mental status will improve Outcome: Progressing Goal: Verbalization of understanding the information provided will improve Outcome: Progressing   Problem: Activity: Goal: Interest or engagement in activities will improve Outcome: Progressing Goal: Sleeping patterns will improve Outcome: Progressing   Problem: Coping: Goal: Ability to verbalize frustrations and anger appropriately will improve Outcome: Progressing Goal: Ability to demonstrate self-control will improve Outcome: Progressing   Problem: Health Behavior/Discharge Planning: Goal: Identification of resources available to assist in meeting health care needs will improve Outcome: Progressing Goal: Compliance with treatment plan for underlying cause of condition will improve Outcome: Progressing   Problem: Physical Regulation: Goal: Ability to maintain clinical measurements within normal limits will improve Outcome: Progressing   Problem: Safety: Goal: Periods of time without injury will increase Outcome: Progressing   Problem: Education: Goal: Ability to make informed decisions regarding  treatment will improve Outcome: Progressing   Problem: Coping: Goal: Coping ability will improve Outcome: Progressing   Problem: Health Behavior/Discharge Planning: Goal: Identification of resources available to assist in meeting health care needs will improve Outcome: Progressing   Problem: Medication: Goal: Compliance with prescribed medication regimen will improve Outcome: Progressing   Problem: Self-Concept: Goal: Ability to disclose and discuss suicidal ideas will improve Outcome: Progressing Goal: Will verbalize positive feelings about self Outcome: Progressing   Problem: Activity: Goal: Will identify at least one activity in which they can participate Outcome: Progressing   Problem: Coping: Goal: Ability to identify and develop effective coping behavior will improve Outcome: Progressing Goal: Ability to interact with others will improve Outcome: Progressing Goal: Demonstration of participation in decision-making regarding own care will improve Outcome: Progressing Goal: Ability to use eye contact when communicating with others will improve Outcome: Progressing   Problem: Health Behavior/Discharge Planning: Goal: Identification of resources available to assist in meeting health care needs will improve Outcome: Progressing   Problem: Self-Concept: Goal: Will verbalize positive feelings about self Outcome: Progressing   Problem: Education: Goal: Ability to state activities that reduce stress will improve Outcome: Progressing   Problem: Coping: Goal: Ability to identify and develop effective coping behavior will improve Outcome: Progressing   Problem: Self-Concept: Goal: Ability to identify factors that promote anxiety will improve Outcome: Progressing Goal: Level of anxiety will decrease Outcome: Progressing Goal: Ability to modify response to factors that promote anxiety will improve Outcome: Progressing

## 2020-02-24 NOTE — Progress Notes (Signed)
Recreation Therapy Notes  Date: 02/24/2020  Time: 9:30 am   Location: Craft room    Behavioral response: N/A   Intervention Topic: Necessities   Discussion/Intervention: Patient did not attend group.   Clinical Observations/Feedback:  Patient did not attend group.   Charle Mclaurin LRT/CTRS        Tychelle Purkey 02/24/2020 11:51 AM 

## 2020-02-25 MED ORDER — TRAZODONE HCL 50 MG PO TABS: 50.0000 mg | ORAL_TABLET | Freq: Every evening | ORAL | 1 refills | Status: DC | PRN

## 2020-02-25 MED ORDER — BENZTROPINE MESYLATE 0.5 MG PO TABS: 0.5000 mg | ORAL_TABLET | Freq: Two times a day (BID) | ORAL | 1 refills | Status: DC

## 2020-02-25 MED ORDER — HALOPERIDOL 5 MG PO TABS: 5.0000 mg | ORAL_TABLET | Freq: Two times a day (BID) | ORAL | 0 refills | Status: DC

## 2020-02-25 MED ORDER — HALOPERIDOL DECANOATE 100 MG/ML IM SOLN: 100.0000 mg | INTRAMUSCULAR | 1 refills | Status: DC

## 2020-02-25 NOTE — [facility] Suicide Risk Assessment (Signed)
[facility] Discharge Suicide Risk Assessment   Principal Problem: Schizoaffective disorder [facility]) Discharge Diagnoses: Principal Problem:   Schizoaffective disorder (HCC) Active Problems:   Dysmenorrhea   Total Time spent with patient: 30 minutes  Musculoskeletal: Strength & Muscle Tone: within normal limits Gait & Station: normal Patient leans: N/A  Psychiatric Specialty Exam: Review of Systems  Constitutional: Negative.   HENT: Negative.   Eyes: Negative.   Respiratory: Negative.   Cardiovascular: Negative.   Gastrointestinal: Negative.   Musculoskeletal: Negative.   Skin: Negative.   Neurological: Negative.   Psychiatric/Behavioral: Negative.     Blood pressure 126/79, pulse 84, temperature 98.2 F (36.8 C), temperature source Oral, resp. rate 18, height 5\' 2"  (1.575 m), weight 79.4 kg, SpO2 100 %.Body mass index is 32.01 kg/m.  General Appearance: Casual  Eye Contact::  Good  Speech:  Clear and Coherent409  Volume:  Normal  Mood:  Euthymic  Affect:  Congruent  Thought Process:  Coherent  Orientation:  Full (Time, Place, and Person)  Thought Content:  Logical  Suicidal Thoughts:  No  Homicidal Thoughts:  No  Memory:  Immediate;   Fair Recent;   Fair Remote;   Fair  Judgement:  Fair  Insight:  Fair  Psychomotor Activity:  Normal  Concentration:  Fair  Recall:  [id number] of Knowledge:Fair  Language: Fair  Akathisia:  No  Handed:  Right  AIMS (if indicated):     Assets:  Desire for Improvement  Sleep:  Number of Hours: 8  Cognition: WNL  ADL's:  Intact   Mental Status Per Nursing Assessment::   On Admission:  Thoughts of violence towards others  Demographic Factors:  Adolescent or young adult  Loss Factors: NA  Historical Factors: Impulsivity  Risk Reduction Factors:   Living with another person, especially a relative, Positive social support and Positive therapeutic relationship  Continued Clinical Symptoms:  Schizophrenia:   Less than 27 years  old  Cognitive Features That Contribute To Risk:  None    Suicide Risk:  Minimal: No identifiable suicidal ideation.  Patients presenting with no risk factors but with morbid ruminations; may be classified as minimal risk based on the severity of the depressive symptoms  Follow-up Information    [facility] Pediatric Follow up on 02/25/2020.   Why: You are scheduled to meet with Dr. 02/27/2020 on Tuesday, May 25th at 10:45am. Thank you. Contact information: [address],  [place], [place] [phone] [phone]          Plan Of Care/Follow-up recommendations:  Activity:  Activity as tolerated Diet:  Regular diet Other:  Follow-up with outpatient treatment as recommended staying on oral medication to start with and then switching to Haldol decanoate  [phone], MD 02/25/2020, 9:34 AM

## 2020-02-25 NOTE — Progress Notes (Signed)
Pt denies SI, Hi and AVH. Pt received belongings, prescriptions and dc packet. Pt was educated on dc plan and verbalizes understanding. Regana Kemple RN 

## 2020-02-25 NOTE — [facility] Group Notes (Signed)
[facility] Group Notes:  (Nursing/MHT/Case Management/Adjunct)  Date:  02/25/2020  Time:  11:13 AM  Type of Therapy:  COMMUNITY MEETING  Participation Level:  Minimal  Participation Quality:  Appropriate  Affect:  Appropriate  Cognitive:  Appropriate  Insight:  Appropriate  Engagement in Group:  Improving  Modes of Intervention:  Discussion and Education  Summary of Progress/Problems:  Madison Boyer 02/25/2020, 11:13 AM

## 2020-02-25 NOTE — Progress Notes (Signed)
Recreation Therapy Notes  Date: 02/25/2020  Time: 9:30 am  Location: Craft room   Behavioral response: Appropriate   Intervention Topic: Coping Skills   Discussion/Intervention:  Group content on today was focused on coping skills. The group defined what coping skills are and when they can be used. Individuals described how they normally cope with thing and the coping skills they normally use. Patients expressed why it is important to cope with things and how not coping with things can affect you. The group participated in the intervention "My coping box" and made coping boxes while adding coping skills they could use in the future to the box. Clinical Observations/Feedback:  Patient came to group late  and was focused on what peers and staff had to say about coping skills. Individual was social with peers and staff while participating in the intervention.   Mariamawit Depaoli LRT/CTRS            Daegen Berrocal 02/25/2020 12:06 PM

## 2020-02-25 NOTE — Progress Notes (Signed)
  [facility] Adult Case Management Discharge Plan :  Will you be returning to the same living situation after discharge:  Yes,  lives with parents At discharge, do you have transportation home?: Yes,  pt reports uncle will pick up Do you have the ability to pay for your medications: Yes,  Medicaid  Release of information consent forms completed and in the chart;  Patient's signature needed at discharge.  Patient to Follow up at: Follow-up Information    [facility] Pediatric Follow up on 02/26/2020.   Why: You are scheduled to meet with Dr. Francetta Found on Wednesday, May 26th at 1:30pm. Thank you. Contact information: [address],  [place], [place] [address] [phone] [phone]          Next level of care provider has access to [facility] Link:no  Safety Planning and Suicide Prevention discussed: Yes,  Karl Pock, father     Has patient been referred to the Quitline?: N/A patient is not a smoker  Patient has been referred for addiction treatment: N/A  Suzan Slick, LCSW 02/25/2020, 10:08 AM

## 2020-02-25 NOTE — Discharge Summary (Signed)
Physician Discharge Summary Note  Patient:  Madison Boyer is an 19 y.o., female MRN:  [id number] DOB:  [birth year] Patient phone:  [phone] (home)  Patient address:   [address] [place] [address],  Total Time spent with patient: 30 minutes  Date of Admission:  02/16/2020 Date of Discharge: Feb 25, 2020  Reason for Admission: Admitted to the psychiatric service because of psychotic symptoms bizarre behavior evidence of hallucinations and paranoia  Principal Problem: Schizoaffective disorder [facility]) Discharge Diagnoses: Principal Problem:   Schizoaffective disorder (HCC) Active Problems:   Dysmenorrhea   Past Psychiatric History: Patient had a history of psychotic symptoms of fairly recent onset with several recent hospitalizations  Past Medical History: History reviewed. No pertinent past medical history.  Past Surgical History:  Procedure Laterality Date  . APPENDECTOMY     Family History:  Family History  Problem Relation Age of Onset  . Healthy Mother   . Healthy Father   . Healthy Maternal Grandmother   . Diabetes Maternal Grandfather   . Healthy Paternal Grandmother   . Healthy Paternal Grandfather    Family Psychiatric  History: See previous Social History:  Social History   Substance and Sexual Activity  Alcohol Use Yes   Comment: occassionally     Social History   Substance and Sexual Activity  Drug Use Yes  . Types: Marijuana    Social History   Socioeconomic History  . Marital status: Single    Spouse name: Not on file  . Number of children: Not on file  . Years of education: Not on file  . Highest education level: Not on file  Occupational History  . Not on file  Tobacco Use  . Smoking status: Current Some Day Smoker  . Smokeless tobacco: Never Used  Substance and Sexual Activity  . Alcohol use: Yes    Comment: occassionally  . Drug use: Yes    Types: Marijuana  . Sexual activity: Yes    Birth  control/protection: Condom  Other Topics Concern  . Not on file  Social History Narrative  . Not on file   Social Determinants of Health   Financial Resource Strain:   . Difficulty of Paying Living Expenses:   Food Insecurity:   . Worried About Programme researcher, broadcasting/film/video in the Last Year:   . Barista in the Last Year:   Transportation Needs:   . Freight forwarder (Medical):   Marland Kitchen Lack of Transportation (Non-Medical):   Physical Activity:   . Days of Exercise per Week:   . Minutes of Exercise per Session:   Stress:   . Feeling of Stress :   Social Connections:   . Frequency of Communication with Friends and Family:   . Frequency of Social Gatherings with Friends and Family:   . Attends Religious Services:   . Active Member of Clubs or Organizations:   . Attends Banker Meetings:   Marland Kitchen Marital Status:     Hospital Course: Patient was treated for psychotic symptoms.  Behavior and symptom presentation appeared to be most consistent with schizophrenia.  Initially treated with Risperdal with minimal improvement she continued to be disorganized paranoid and strange in her behavior.  Medication was switched to haloperidol 5 mg twice a day and she showed more rapid improvement.  She consented to Haldol decanoate injection which was done on 24 May.  I have advised her to continue oral Haldol for another 10 days at which point she should  be able to stop it but continue using the Cogentin at least as a as needed.  Prescriptions will be provided at discharge.  Psychoeducation has been done about the appropriate treatment of schizophrenia and the risk of nontreatment.  Patient is agreeable to plan.  Physical Findings: AIMS: Facial and Oral Movements Muscles of Facial Expression: None, normal Lips and Perioral Area: None, normal Jaw: None, normal Tongue: None, normal,Extremity Movements Upper (arms, wrists, hands, fingers): None, normal Lower (legs, knees, ankles, toes): None,  normal, Trunk Movements Neck, shoulders, hips: None, normal, Overall Severity Severity of abnormal movements (highest score from questions above): None, normal Incapacitation due to abnormal movements: None, normal Patient's awareness of abnormal movements (rate only patient's report): No Awareness, Dental Status Current problems with teeth and/or dentures?: No Does patient usually wear dentures?: No  CIWA:    COWS:     Musculoskeletal: Strength & Muscle Tone: within normal limits Gait & Station: normal Patient leans: N/A  Psychiatric Specialty Exam: Physical Exam  Nursing note and vitals reviewed. Constitutional: She appears well-developed and well-nourished.  HENT:  Head: Normocephalic and atraumatic.  Eyes: Pupils are equal, round, and reactive to light. Conjunctivae are normal.  Cardiovascular: Regular rhythm and normal heart sounds.  Respiratory: Effort normal. No respiratory distress.  GI: Soft.  Musculoskeletal:        General: Normal range of motion.     Cervical back: Normal range of motion.  Neurological: She is alert.  Skin: Skin is warm and dry.  Psychiatric: Judgment normal. Her affect is blunt. Her speech is delayed. She is slowed. Thought content is not paranoid. Cognition and memory are impaired. She expresses no homicidal and no suicidal ideation.    Review of Systems  Constitutional: Negative.   HENT: Negative.   Eyes: Negative.   Respiratory: Negative.   Cardiovascular: Negative.   Gastrointestinal: Negative.   Musculoskeletal: Negative.   Skin: Negative.   Neurological: Negative.   Psychiatric/Behavioral: Negative.     Blood pressure 126/79, pulse 84, temperature 98.2 F (36.8 C), temperature source Oral, resp. rate 18, height 5\' 2"  (1.575 m), weight 79.4 kg, SpO2 100 %.Body mass index is 32.01 kg/m.  General Appearance: Casual  Eye Contact:  Good  Speech:  Slow  Volume:  Decreased  Mood:  Dysphoric  Affect:  Constricted  Thought Process:   Coherent  Orientation:  Full (Time, Place, and Person)  Thought Content:  Logical  Suicidal Thoughts:  No  Homicidal Thoughts:  No  Memory:  Immediate;   Fair Recent;   Fair Remote;   Fair  Judgement:  Fair  Insight:  Fair  Psychomotor Activity:  Normal  Concentration:  Concentration: Fair  Recall:  [employer] of Knowledge:  Fair  Language:  Fair  Akathisia:  No  Handed:  Right  AIMS (if indicated):     Assets:  Desire for Improvement  ADL's:  Intact  Cognition:  WNL  Sleep:  Number of Hours: 8        Has this patient used any form of tobacco in the last 30 days? (Cigarettes, Smokeless Tobacco, Cigars, and/or Pipes) Yes, No  Blood Alcohol level:  Lab Results  Component Value Date   ETH <10 58/06/9832    Metabolic Disorder Labs:  No results found for: HGBA1C, MPG No results found for: PROLACTIN No results found for: CHOL, TRIG, HDL, CHOLHDL, VLDL, LDLCALC  See Psychiatric Specialty Exam and Suicide Risk Assessment completed by Attending Physician prior to discharge.  Discharge destination:  Home  Is patient on multiple antipsychotic therapies at discharge:  No   Has Patient had three or more failed trials of antipsychotic monotherapy by history:  No  Recommended Plan for Multiple Antipsychotic Therapies: NA  Discharge Instructions    Diet - low sodium heart healthy   Complete by: As directed    Increase activity slowly   Complete by: As directed      Allergies as of 02/25/2020   No Known Allergies     Medication List    STOP taking these medications   norgestimate-ethinyl estradiol 0.25-35 MG-MCG tablet Commonly known as: ORTHO-CYCLEN     TAKE these medications     Indication  benztropine 0.5 MG tablet Commonly known as: COGENTIN Take 1 tablet (0.5 mg total) by mouth 2 (two) times daily.  Indication: Extrapyramidal Reaction caused by Medications   haloperidol 5 MG tablet Commonly known as: HALDOL Take 1 tablet (5 mg total) by mouth 2 (two)  times daily.  Indication: Psychosis   haloperidol decanoate 100 MG/ML injection Commonly known as: HALDOL DECANOATE Inject 1 mL (100 mg total) into the muscle every 30 (thirty) days. Next dose due on March 25, 2020 Start taking on: March 25, 2020  Indication: Schizophrenia   traZODone 50 MG tablet Commonly known as: DESYREL Take 1 tablet (50 mg total) by mouth at bedtime as needed for sleep.  Indication: Trouble Sleeping      Follow-up Information    [facility] Follow up on 02/25/2020.   Why: You are scheduled to meet with Dr. Francetta Found on Tuesday, May 25th at 10:45am. Thank you. Contact information: [address],  [place], [place] [address] [phone] Fax:[phone]          Follow-up recommendations:  Activity:  Activity as tolerated Diet:  Regular diet Other:  Follow-up with outpatient treatment with usual provider.  Next injection due in a month.  Comments: Psychoeducation and review of treatment plan completed.  Prescriptions given at discharge  Signed: Mordecai Rasmussen, MD 02/25/2020, 9:38 AM

## 2020-02-25 NOTE — Progress Notes (Signed)
Recreation Therapy Notes  INPATIENT RECREATION TR PLAN  Patient Details Name: Madison Boyer MRN: [id number] DOB: Feb 08, 2001 Today's Date: 02/25/2020  Rec Therapy Plan Is patient appropriate for Therapeutic Recreation?: Yes Treatment times per week: at least 3 Estimated Length of Stay: 5-7 days TR Treatment/Interventions: Group participation (Comment)  Discharge Criteria Pt will be discharged from therapy if:: Discharged Treatment plan/goals/alternatives discussed and agreed upon by:: Patient/family  Discharge Summary Short term goals set: Patient will engage in groups without prompting or encouragement from LRT x3 group sessions within 5 recreation therapy group sessions Short term goals met: Not met Progress toward goals comments: Groups attended Which groups?: Coping skills Reason goals not met: Patient spent her time walking the hall ways Therapeutic equipment acquired: N/A Reason patient discharged from therapy: Discharge from hospital Pt/family agrees with progress & goals achieved: Yes Date patient discharged from therapy: 02/25/20   Brevin Mcfadden 02/25/2020, 12:10 PM

## 2020-02-25 NOTE — Plan of Care (Addendum)
Pt rates anxiety 10/10. Pt denies depression, SI, HI and AVH. Pt was educated on care plan and verbalizes understanding. Pt is looking froward to discharge. Pt has been given a new set of clothes and flip flops.  Torrie Mayers RN Problem: Education: Goal: Knowledge of [facility] Education information/materials will improve Outcome: Adequate for Discharge Goal: Emotional status will improve Outcome: Adequate for Discharge Goal: Mental status will improve Outcome: Adequate for Discharge Goal: Verbalization of understanding the information provided will improve Outcome: Adequate for Discharge   Problem: Activity: Goal: Interest or engagement in activities will improve Outcome: Adequate for Discharge Goal: Sleeping patterns will improve Outcome: Adequate for Discharge   Problem: Coping: Goal: Ability to verbalize frustrations and anger appropriately will improve Outcome: Adequate for Discharge Goal: Ability to demonstrate self-control will improve Outcome: Adequate for Discharge   Problem: Health Behavior/Discharge Planning: Goal: Identification of resources available to assist in meeting health care needs will improve Outcome: Adequate for Discharge Goal: Compliance with treatment plan for underlying cause of condition will improve Outcome: Adequate for Discharge   Problem: Physical Regulation: Goal: Ability to maintain clinical measurements within normal limits will improve Outcome: Adequate for Discharge   Problem: Safety: Goal: Periods of time without injury will increase Outcome: Adequate for Discharge   Problem: Education: Goal: Ability to make informed decisions regarding treatment will improve Outcome: Adequate for Discharge   Problem: Coping: Goal: Coping ability will improve Outcome: Adequate for Discharge   Problem: Health Behavior/Discharge Planning: Goal: Identification of resources available to assist in meeting health care needs will improve Outcome:  Adequate for Discharge   Problem: Medication: Goal: Compliance with prescribed medication regimen will improve Outcome: Adequate for Discharge   Problem: Self-Concept: Goal: Ability to disclose and discuss suicidal ideas will improve Outcome: Adequate for Discharge Goal: Will verbalize positive feelings about self Outcome: Adequate for Discharge   Problem: Activity: Goal: Will identify at least one activity in which they can participate Outcome: Adequate for Discharge   Problem: Coping: Goal: Ability to identify and develop effective coping behavior will improve Outcome: Adequate for Discharge Goal: Ability to interact with others will improve Outcome: Adequate for Discharge Goal: Demonstration of participation in decision-making regarding own care will improve Outcome: Adequate for Discharge Goal: Ability to use eye contact when communicating with others will improve Outcome: Adequate for Discharge   Problem: Health Behavior/Discharge Planning: Goal: Identification of resources available to assist in meeting health care needs will improve Outcome: Adequate for Discharge   Problem: Self-Concept: Goal: Will verbalize positive feelings about self Outcome: Adequate for Discharge   Problem: Education: Goal: Ability to state activities that reduce stress will improve Outcome: Adequate for Discharge   Problem: Coping: Goal: Ability to identify and develop effective coping behavior will improve Outcome: Adequate for Discharge   Problem: Self-Concept: Goal: Ability to identify factors that promote anxiety will improve Outcome: Adequate for Discharge Goal: Level of anxiety will decrease Outcome: Adequate for Discharge Goal: Ability to modify response to factors that promote anxiety will improve Outcome: Adequate for Discharge

## 2020-03-24 ENCOUNTER — Emergency Department
Admission: EM | Admit: 2020-03-24 | Discharge: 2020-03-25 | Disposition: A | Discharge status: Home / Self Care | Payer: Medicaid Other | Attending: Emergency Medicine | Admitting: Emergency Medicine

## 2020-03-24 ENCOUNTER — Other Ambulatory Visit: Payer: Self-pay

## 2020-03-24 DIAGNOSIS — R440 Auditory hallucinations: Secondary | ICD-10-CM | POA: Diagnosis not present

## 2020-03-24 DIAGNOSIS — Z046 Encounter for general psychiatric examination, requested by authority: Secondary | ICD-10-CM | POA: Diagnosis present

## 2020-03-24 DIAGNOSIS — F23 Brief psychotic disorder: Secondary | ICD-10-CM | POA: Diagnosis not present

## 2020-03-24 DIAGNOSIS — Z79899 Other long term (current) drug therapy: Secondary | ICD-10-CM | POA: Insufficient documentation

## 2020-03-24 DIAGNOSIS — N946 Dysmenorrhea, unspecified: Secondary | ICD-10-CM | POA: Diagnosis present

## 2020-03-24 DIAGNOSIS — F259 Schizoaffective disorder, unspecified: Secondary | ICD-10-CM | POA: Diagnosis present

## 2020-03-24 DIAGNOSIS — Z20822 Contact with and (suspected) exposure to covid-19: Secondary | ICD-10-CM | POA: Diagnosis not present

## 2020-03-24 LAB — URINE DRUG SCREEN, QUALITATIVE (ARMC ONLY)
Amphetamines, Ur Screen: NOT DETECTED
Barbiturates, Ur Screen: NOT DETECTED
Benzodiazepine, Ur Scrn: NOT DETECTED
Cannabinoid 50 Ng, Ur ~~LOC~~: POSITIVE — AB
Cocaine Metabolite,Ur ~~LOC~~: NOT DETECTED
MDMA (Ecstasy)Ur Screen: NOT DETECTED
Methadone Scn, Ur: NOT DETECTED
Opiate, Ur Screen: NOT DETECTED
Phencyclidine (PCP) Ur S: NOT DETECTED
Tricyclic, Ur Screen: NOT DETECTED

## 2020-03-24 LAB — COMPREHENSIVE METABOLIC PANEL
ALT: 27 U/L (ref 0–44)
AST: 22 U/L (ref 15–41)
Albumin: 4.7 g/dL (ref 3.5–5.0)
Alkaline Phosphatase: 87 U/L (ref 38–126)
Anion gap: 10 (ref 5–15)
BUN: 10 mg/dL (ref 6–20)
CO2: 23 mmol/L (ref 22–32)
Calcium: 9.1 mg/dL (ref 8.9–10.3)
Chloride: 105 mmol/L (ref 98–111)
Creatinine, Ser: 0.61 mg/dL (ref 0.44–1.00)
GFR calc Af Amer: >60 mL/min (ref >60)
GFR calc non Af Amer: >60 mL/min (ref >60)
Glucose, Bld: 95 mg/dL (ref 70–99)
Potassium: 3.5 mmol/L (ref 3.5–5.1)
Sodium: 138 mmol/L (ref 135–145)
Total Bilirubin: 1.2 mg/dL (ref 0.3–1.2)
Total Protein: 7.8 g/dL (ref 6.5–8.1)

## 2020-03-24 LAB — POCT PREGNANCY, URINE: Preg Test, Ur: NEGATIVE

## 2020-03-24 LAB — CBC
HCT: 43 % (ref 36.0–46.0)
Hemoglobin: 15.1 g/dL — ABNORMAL HIGH (ref 12.0–15.0)
MCH: 31.8 pg (ref 26.0–34.0)
MCHC: 35.1 g/dL (ref 30.0–36.0)
MCV: 90.5 fL (ref 80.0–100.0)
Platelets: 340 10*3/uL (ref 150–400)
RBC: 4.75 MIL/uL (ref 3.87–5.11)
RDW: 12.2 % (ref 11.5–15.5)
WBC: 15.8 10*3/uL — ABNORMAL HIGH (ref 4.0–10.5)
nRBC: 0 % (ref 0.0–0.2)

## 2020-03-24 LAB — SARS CORONAVIRUS 2 BY RT PCR (HOSPITAL ORDER, PERFORMED IN ~~LOC~~ HOSPITAL LAB): SARS Coronavirus 2: NEGATIVE

## 2020-03-24 LAB — ETHANOL: Alcohol, Ethyl (B): <10 mg/dL (ref <10)

## 2020-03-24 LAB — ACETAMINOPHEN LEVEL: Acetaminophen (Tylenol), Serum: <10 ug/mL — ABNORMAL LOW (ref 10–30)

## 2020-03-24 LAB — SALICYLATE LEVEL: Salicylate Lvl: <7 mg/dL — ABNORMAL LOW (ref 7.0–30.0)

## 2020-03-24 MED ORDER — LORAZEPAM 2 MG/ML IJ SOLN: 2.0000 mg | Freq: Once | INTRAMUSCULAR | Status: AC

## 2020-03-24 MED ORDER — RISPERIDONE 1 MG PO TABS
1.0000 mg | ORAL_TABLET | Freq: Two times a day (BID) | ORAL | Status: DC
  Administered 2020-03-24: 1 mg via ORAL
  Filled 2020-03-24: qty 1

## 2020-03-24 MED ORDER — LORAZEPAM 2 MG/ML IJ SOLN
INTRAMUSCULAR | Status: AC
  Administered 2020-03-24: 2 mg via INTRAMUSCULAR
  Filled 2020-03-24: qty 1

## 2020-03-24 MED ORDER — HYDROXYZINE HCL 25 MG PO TABS: 25.0000 mg | ORAL_TABLET | Freq: Three times a day (TID) | ORAL | Status: DC | PRN

## 2020-03-24 MED ORDER — TRAZODONE HCL 50 MG PO TABS
50.0000 mg | ORAL_TABLET | Freq: Every evening | ORAL | Status: DC | PRN
  Administered 2020-03-24: 50 mg via ORAL
  Filled 2020-03-24: qty 1

## 2020-03-24 MED ORDER — HALOPERIDOL LACTATE 5 MG/ML IJ SOLN
INTRAMUSCULAR | Status: AC
  Administered 2020-03-24: 5 mg via INTRAMUSCULAR
  Filled 2020-03-24: qty 1

## 2020-03-24 MED ORDER — NORETHIN-ETH ESTRADIOL-FE 0.4-35 MG-MCG PO CHEW: 1.0000 | CHEWABLE_TABLET | Freq: Every day | ORAL | Status: DC

## 2020-03-24 MED ORDER — HALOPERIDOL LACTATE 5 MG/ML IJ SOLN: 5.0000 mg | Freq: Once | INTRAMUSCULAR | Status: AC

## 2020-03-24 MED ORDER — IBUPROFEN 400 MG PO TABS: 400.0000 mg | ORAL_TABLET | Freq: Four times a day (QID) | ORAL | Status: DC | PRN

## 2020-03-24 NOTE — ED Notes (Signed)
Pt  banging her hands on the garage door wanting food etc.  Pt instructed not to do so.  Pt informed we have no control when the food from dining arrives to the ER.  Pt states I am crazy, rubbing her head.  Food arrived and pt sitting on bed eating.

## 2020-03-24 NOTE — ED Notes (Signed)
Pt requesting trazodone.  meds given.

## 2020-03-24 NOTE — ED Notes (Signed)
IVC  with all papers on chart, awaiting TTS/Psych consult 

## 2020-03-24 NOTE — ED Notes (Signed)
Pt slamming doors and banging on garage door.  Pt wanting to walk around the department and wants to leave after injections.  Pt is IVC  meds given.  Pt tearful  Pt ate some of her dinner tray.

## 2020-03-24 NOTE — ED Triage Notes (Signed)
Pt brought over from [facility] via [employer] officer under IVC, report states pt has been having auditory hallucinations, it wanting to shoot herself but does not have access to a gun a present. Was recently [facility]. In the report pt has not slept in 2 days and has gone wondering in the middle of the night. Pt is calm and cooperative in triage.

## 2020-03-24 NOTE — BH Assessment (Addendum)
Assessment Note  Madison Boyer is an 19 y.o. female who presents to [facility] ED involuntarily for treatment. Per triage note, Pt brought over from [facility] via [employer] officer under IVC, report states pt has been having auditory hallucinations, it wanting to shoot herself but does not have access to a gun a present. Was recently admitted to [facility] [place]. In the report pt has not slept in 2 days and has gone wondering in the middle of the night. Pt is calm and cooperative in triage.   During TTS assessment pt presented alert and oriented x 3, anxious, irritable but cooperative, and mood-congruent with affect. The pt does not appear to be responding to internal or external stimuli. Neither is the pt presenting with any delusional thinking. Pt denies the information reported to the triage RN. Pt pasted the room throughout majority of the assessment stating "I just need to walk to try to calm down". Pt reports feeling as if her mom hates her but was unable to express why. Pt reports getting into a verbal altercation with her mom over her smoking (Marijuana) 2 days ago. Pt reports deciding to walk to a friend's house for space away from mom last night and not returning home until today. Pt reports mom reported her missing to [employer] who located her while walking from her friend's house. Pt reports asking [employer] to take her to a shelter but instead they took her home. Pt reports leaving the home with mom to receive her shot at [facility] and after arriving the [facility], [employer] was presented and instructed her that she would receive her shot at [facility]. Pt states "I just want to go back home" while crying unconsolably. Pt reports a INPT hx with [facility] and [facility] (02/15/20) and a active OPT services with [facility]. Pt reports her information to be same as last month with no changes.   Pt reports SA hx of (Marijuana & Beer) and her last use of marijuana to be 2 days ago and drinking 2 beers earlier today.  Pt denies any eating struggles but expressed  some struggles sleeping stating "I think I get maybe 6-8 but lately not really a lot". Pt was unable to elaborate on her statement "not really a lot". Pt reports currently experiencing fleeting SI without a plan and AH of unfamiliar voices telling her she is crazy. Pt denies any current HI/VH. Pt expressed feeling INPT is needed as it was helpful during her last admission and was unable to contract safety.  In efforts to gather collateral information pt states "I am 19 years old no I do not want you to talk to my mom" and provided permission for TTS to talk with her MD but was unable to provide a name.   disposition is pending psych consult   Diagnosis: Major Depression   Past Medical History: History reviewed. No pertinent past medical history.  Past Surgical History:  Procedure Laterality Date  . APPENDECTOMY      Family History:  Family History  Problem Relation Age of Onset  . Healthy Mother   . Healthy Father   . Healthy Maternal Grandmother   . Diabetes Maternal Grandfather   . Healthy Paternal Grandmother   . Healthy Paternal Grandfather     Social History:  reports that she has never smoked. She has never used smokeless tobacco. She reports current alcohol use. She reports current drug use. Drug: Marijuana.  Additional Social History:  Alcohol / Drug Use Pain Medications: see mar Prescriptions:  see mar Over the Counter: see mar History of alcohol / drug use?: Yes Substance #1 Name of Substance 1: Marijuana  (Delta 8) Substance #2 Name of Substance 2: Beer  CIWA: CIWA-Ar BP: 134/85 Pulse Rate: (!) 106 COWS:    Allergies: No Known Allergies  Home Medications: (Not in a hospital admission)   OB/GYN Status:  Patient's last menstrual period was 02/25/2020 (approximate).  General Assessment Data Location of Assessment: [facility] ED TTS Assessment: In system Is this a Tele or Face-to-Face Assessment?: Face-to-Face Is this an Initial Assessment or a Re-assessment for  this encounter?: Initial Assessment Patient Accompanied by:: N/A Language Other than English: No Living Arrangements: Other (Comment) (Private home) What gender do you identify as?: Female Marital status: Single Maiden name: n/a Pregnancy Status: No Living Arrangements: Parent Can pt return to current living arrangement?: Yes Admission Status: Involuntary Petitioner: Other Is patient capable of signing voluntary admission?: Yes Referral Source: Other Insurance type: Medicaid  Medical Screening Exam ([place]) Medical Exam completed: Yes  Crisis Care Plan Living Arrangements: Parent Name of Psychiatrist: [facility] Name of Therapist: [facility]  Education Status Is patient currently in school?: No Is the patient employed, unemployed or receiving disability?: Unemployed  Risk to self with the past 6 months Suicidal Ideation: Yes-Currently Present Has patient been a risk to self within the past 6 months prior to admission? : No Suicidal Intent: No Has patient had any suicidal intent within the past 6 months prior to admission? : No Is patient at risk for suicide?: No Suicidal Plan?: No Has patient had any suicidal plan within the past 6 months prior to admission? : No Access to Means: No What has been your use of drugs/alcohol within the last 12 months?: [place] (Delta 8) & Beer Previous Attempts/Gestures: No How many times?: 0 Other Self Harm Risks: None reported  Triggers for Past Attempts: None known Intentional Self Injurious Behavior: None Family Suicide History: Unknown Recent stressful life event(s): Conflict (Comment) (with mom ) Persecutory voices/beliefs?: Yes Depression: Yes Depression Symptoms: Insomnia, Feeling angry/irritable Substance abuse history and/or treatment for substance abuse?: Yes Suicide prevention information given to non-admitted patients: Yes  Risk to Others within the past 6 months Homicidal Ideation: No Does patient have any lifetime risk of  violence toward others beyond the six months prior to admission? : No Thoughts of Harm to Others: No Current Homicidal Intent: No Current Homicidal Plan: No Access to Homicidal Means: No Identified Victim: n/a History of harm to others?: No Assessment of Violence: None Noted Violent Behavior Description: n/a Does patient have access to weapons?: No Criminal Charges Pending?: No Does patient have a court date: No Is patient on probation?: Unknown  Psychosis Hallucinations: None noted Delusions: None noted  Mental Status Report Appearance/Hygiene: Unremarkable, In scrubs Eye Contact: Fair Motor Activity: Freedom of movement Speech: Logical/coherent, Soft Level of Consciousness: Alert, Restless, Irritable Mood: Anxious, Depressed, Labile, Irritable Affect: Anxious, Depressed, Labile, Irritable Anxiety Level: Severe Thought Processes: Relevant, Tangential Judgement: Partial Orientation: Appropriate for developmental age Obsessive Compulsive Thoughts/Behaviors: Minimal (I need to leave; pasting )  Cognitive Functioning Concentration: Fair Memory: Recent Intact, Remote Intact Is patient IDD: No Insight: Poor Impulse Control: Fair Appetite: Good Have you had any weight changes? : No Change Sleep: No Change Total Hours of Sleep:  (pt reports maybe 6 ) Vegetative Symptoms: None  ADLScreening [facility] Assessment Services) Patient able to express need for assistance with ADLs?: Yes  Prior Inpatient Therapy Prior Inpatient Therapy: Yes Prior Therapy Dates:  02/15/20 Prior Therapy Facilty/Provider(s): [facility]; [facility]  Reason for Treatment: MH  Prior Outpatient Therapy Prior Outpatient Therapy: Yes Prior Therapy Dates: Current Prior Therapy Facilty/Provider(s): [facility] Reason for Treatment: MH Does patient have an ACCT team?: No Does patient have Intensive In-House Services?  : No Does patient have [facility] services? : No Does patient have P4CC services?: Unknown  ADL  Screening (condition at time of admission) Is the patient deaf or have difficulty hearing?: No Does the patient have difficulty seeing, even when wearing glasses/contacts?: No Does the patient have difficulty concentrating, remembering, or making decisions?: No Patient able to express need for assistance with ADLs?: Yes Does the patient have difficulty dressing or bathing?: No Does the patient have difficulty walking or climbing stairs?: No Weakness of Legs: None Weakness of Arms/Hands: None  Home Assistive Devices/Equipment Home Assistive Devices/Equipment: None  Therapy Consults (therapy consults require a physician order) PT Evaluation Needed: No OT Evalulation Needed: No SLP Evaluation Needed: No Abuse/Neglect Assessment (Assessment to be complete while patient is alone) Abuse/Neglect Assessment Can Be Completed: Yes Physical Abuse: Denies Verbal Abuse: Denies Sexual Abuse: Denies Exploitation of patient/patient's resources: Denies Self-Neglect: Denies Values / Beliefs Cultural Requests During Hospitalization: None Spiritual Requests During Hospitalization: None Consults Spiritual Care Consult Needed: No Transition of Care Team Consult Needed: No Advance Directives (For Healthcare) Does Patient Have a Medical Advance Directive?: No Would patient like information on creating a medical advance directive?: No - Patient declined          Disposition:  Disposition Initial Assessment Completed for this Encounter: Yes Patient referred to: Other (Comment)  On Site Evaluation by:   Reviewed with Physician:    Opal Sidles 03/24/2020 6:21 PM

## 2020-03-24 NOTE — ED Notes (Signed)
Pt sleeping. 

## 2020-03-24 NOTE — Consult Note (Signed)
[facility] Consult   Reason for Consult:  Psych evaluation  Referring Physician:  Dr. Joni Fears Patient Identification: Madison Boyer MRN:  [id number] Principal Diagnosis: <principal problem not specified> Diagnosis:  Active Problems:   Dysmenorrhea   Schizoaffective disorder ([place])   Total Time spent with patient: 30 minutes  Subjective: "The voices are telling me to hurt myself." Madison Boyer is a 19 y.o. female patient presented to [facility] ED via law enforcement under involuntary commitment status by way of [place]. Per the ED triage nursing note, the patient has been having auditory hallucinations.  "The voices are telling me to shoot myself."  The patient does not have access to her current.  She was recently admitted to [facility].  It was reported the patient has not slept in 2 days and has gone wandering in the middle of the night. The patient was seen face-to-face by this provider; the chart was reviewed and consulted with Dr.Stafford on 03/24/2020 due to the patient's care. As a result, it was discussed with the EDP that the patient does meet the criteria to be admitted to the psychiatric inpatient unit.  On evaluation, the patient is alert and oriented x 3, calm, cooperative after being medicated, and mood-congruent with affect. The patient does not appear to be responding to internal or external stimuli. The patient is presenting with delusional thinking. The patient admits to auditory and visual hallucinations. The patient admits to suicidal ideation but denies homicidal ideations. The patient is presenting with psychotic behaviors. During an encounter with the patient, she was able to answer some questions appropriately.  Plan: The patient is a safety risk to self and requires psychiatric inpatient admission for stabilization and treatment.  HPI:  Madison Boyer is a 19 y.o. female with a past history of schizoaffective disorder, recently  hospitalized May 2021 and old Vertis Kelch who is sent to the ED from [facility] under IVC due to auditory hallucinations and suicidal ideation with a plan to shoot herself.  Patient has reported the only reason she has not attempted suicide is that she does not currently have access to a gun.  IVC paperwork from [place] reviewed for additional information. Patient denies any acute symptoms or pain.  Denies any self-injurious behaviors recently.   Past Psychiatric History:  Schizoaffective disorder ([place]) Dysmenorrhea   Risk to Self: Suicidal Ideation: Yes-Currently Present Suicidal Intent: No Is patient at risk for suicide?: No Suicidal Plan?: No Access to Means: No What has been your use of drugs/alcohol within the last 12 months?: [place]a ([place]) & Beer How many times?: 0 Other Self Harm Risks: None reported  Triggers for Past Attempts: None known Intentional Self Injurious Behavior: None Risk to Others: Homicidal Ideation: No Thoughts of Harm to Others: No Current Homicidal Intent: No Current Homicidal Plan: No Access to Homicidal Means: No Identified Victim: n/a History of harm to others?: No Assessment of Violence: None Noted Violent Behavior Description: n/a Does patient have access to weapons?: No Criminal Charges Pending?: No Does patient have a court date: No Prior Inpatient Therapy: Prior Inpatient Therapy: Yes Prior Therapy Dates: 02/15/20 Prior Therapy Facilty/Provider(s): [facility]; [facility]  Reason for Treatment: MH Prior Outpatient Therapy: Prior Outpatient Therapy: Yes Prior Therapy Dates: Current Prior Therapy Facilty/Provider(s): [facility] Reason for Treatment: MH Does patient have an ACCT team?: No Does patient have Intensive In-House Services?  : No Does patient have [facility] services? : No Does patient have P4CC services?: Unknown  Past Medical History: History reviewed. No  pertinent past medical history.  Past Surgical History:  Procedure Laterality Date  .  APPENDECTOMY     Family History:  Family History  Problem Relation Age of Onset  . Healthy Mother   . Healthy Father   . Healthy Maternal Grandmother   . Diabetes Maternal Grandfather   . Healthy Paternal Grandmother   . Healthy Paternal Grandfather    Family Psychiatric  History: unknown Social History:  Social History   Substance and Sexual Activity  Alcohol Use Yes   Comment: occassionally     Social History   Substance and Sexual Activity  Drug Use Yes  . Types: Marijuana    Social History   Socioeconomic History  . Marital status: Single    Spouse name: Not on file  . Number of children: Not on file  . Years of education: Not on file  . Highest education level: Not on file  Occupational History  . Not on file  Tobacco Use  . Smoking status: Never Smoker  . Smokeless tobacco: Never Used  Vaping Use  . Vaping Use: Some days  Substance and Sexual Activity  . Alcohol use: Yes    Comment: occassionally  . Drug use: Yes    Types: Marijuana  . Sexual activity: Yes    Birth control/protection: Condom  Other Topics Concern  . Not on file  Social History Narrative   ** Merged History Encounter **       Social Determinants of Health   Financial Resource Strain:   . Difficulty of Paying Living Expenses:   Food Insecurity:   . Worried About Programme researcher, broadcasting/film/video in the Last Year:   . Barista in the Last Year:   Transportation Needs:   . Freight forwarder (Medical):   Marland Kitchen Lack of Transportation (Non-Medical):   Physical Activity:   . Days of Exercise per Week:   . Minutes of Exercise per Session:   Stress:   . Feeling of Stress :   Social Connections:   . Frequency of Communication with Friends and Family:   . Frequency of Social Gatherings with Friends and Family:   . Attends Religious Services:   . Active Member of Clubs or Organizations:   . Attends Banker Meetings:   Marland Kitchen Marital Status:    Additional Social History:     Allergies:  No Known Allergies  Labs:  Results for orders placed or performed during the hospital encounter of 03/24/20 (from the past 48 hour(s))  Comprehensive metabolic panel     Status: None   Collection Time: 03/24/20  2:29 PM  Result Value Ref Range   Sodium 138 135 - 145 mmol/L   Potassium 3.5 3.5 - 5.1 mmol/L   Chloride 105 98 - 111 mmol/L   CO2 23 22 - 32 mmol/L   Glucose, Bld 95 70 - 99 mg/dL    Comment: Glucose reference range applies only to samples taken after fasting for at least 8 hours.   BUN 10 6 - 20 mg/dL   Creatinine, Ser 2.37 0.44 - 1.00 mg/dL   Calcium 9.1 8.9 - 62.8 mg/dL   Total Protein 7.8 6.5 - 8.1 g/dL   Albumin 4.7 3.5 - 5.0 g/dL   AST 22 15 - 41 U/L   ALT 27 0 - 44 U/L   Alkaline Phosphatase 87 38 - 126 U/L   Total Bilirubin 1.2 0.3 - 1.2 mg/dL   GFR calc non Af Amer >60 >60  mL/min   GFR calc Af Amer >60 >60 mL/min   Anion gap 10 5 - 15    Comment: Performed at [facility], [address] [place], [place] [address]  Ethanol     Status: None   Collection Time: 03/24/20  2:29 PM  Result Value Ref Range   Alcohol, Ethyl (B) <10 <10 mg/dL    Comment: (NOTE) Lowest detectable limit for serum alcohol is 10 mg/dL.  For medical purposes only. Performed at [facility], [address] [place], [place] [address]   Salicylate level     Status: Abnormal   Collection Time: 03/24/20  2:29 PM  Result Value Ref Range   Salicylate Lvl <7.0 (L) 7.0 - 30.0 mg/dL    Comment: Performed at [facility], [address] [place], [place] [address]  Acetaminophen level     Status: Abnormal   Collection Time: 03/24/20  2:29 PM  Result Value Ref Range   Acetaminophen (Tylenol), Serum <10 (L) 10 - 30 ug/mL    Comment: (NOTE) Therapeutic concentrations vary significantly. A range of 10-30 ug/mL  may be an effective concentration for many patients. However, some  are best treated at concentrations outside of this  range. Acetaminophen concentrations >150 ug/mL at 4 hours after ingestion  and >50 ug/mL at 12 hours after ingestion are often associated with  toxic reactions.  Performed at [facility], [address] [place], [place] [address]   cbc     Status: Abnormal   Collection Time: 03/24/20  2:29 PM  Result Value Ref Range   WBC 15.8 (H) 4.0 - 10.5 K/uL   RBC 4.75 3.87 - 5.11 MIL/uL   Hemoglobin 15.1 (H) 12.0 - 15.0 g/dL   HCT 95.243.0 36 - 46 %   MCV 90.5 80.0 - 100.0 fL   MCH 31.8 26.0 - 34.0 pg   MCHC 35.1 30.0 - 36.0 g/dL   RDW 84.112.2 32.411.5 - 40.115.5 %   Platelets 340 150 - 400 K/uL   nRBC 0.0 0.0 - 0.2 %    Comment: Performed at [facility], [address] [place], [place] [address]  SARS Coronavirus 2 by RT PCR (hospital order, performed in [facility]) Nasopharyngeal Nasopharyngeal Swab     Status: None   Collection Time: 03/24/20  3:22 PM   Specimen: Nasopharyngeal Swab  Result Value Ref Range   SARS Coronavirus 2 NEGATIVE NEGATIVE    Comment: (NOTE) SARS-CoV-2 target nucleic acids are NOT DETECTED.  The SARS-CoV-2 RNA is generally detectable in upper and lower respiratory specimens during the acute phase of infection. The lowest concentration of SARS-CoV-2 viral copies this assay can detect is 250 copies / mL. A negative result does not preclude SARS-CoV-2 infection and should not be used as the sole basis for treatment or other patient management decisions.  A negative result may occur with improper specimen collection / handling, submission of specimen other than nasopharyngeal swab, presence of viral mutation(s) within the areas targeted by this assay, and inadequate number of viral copies (<250 copies / mL). A negative result must be combined with clinical observations, patient history, and epidemiological information.  Fact Sheet for Patients:   BoilerBrush.com.cyhttps://www.fda.gov/media/136312/download  Fact Sheet for Healthcare  Providers: https://pope.com/https://www.fda.gov/media/136313/download  This test is not yet approved or  cleared by the [place] FDA and has been authorized for detection and/or diagnosis of SARS-CoV-2 by FDA under an Emergency Use Authorization (EUA).  This EUA will remain in effect (  meaning this test can be used) for the duration of the COVID-19 declaration under Section 564(b)(1) of the Act, 21 U.S.C. section 360bbb-3(b)(1), unless the authorization is terminated or revoked sooner.  Performed at [facility], [address] [place], [place] [address]   Urine Drug Screen, Qualitative     Status: Abnormal   Collection Time: 03/24/20  4:23 PM  Result Value Ref Range   Tricyclic, Ur Screen NONE DETECTED NONE DETECTED   Amphetamines, Ur Screen NONE DETECTED NONE DETECTED   MDMA (Ecstasy)Ur Screen NONE DETECTED NONE DETECTED   Cocaine Metabolite,Ur [place] NONE DETECTED NONE DETECTED   Opiate, Ur Screen NONE DETECTED NONE DETECTED   Phencyclidine (PCP) Ur S NONE DETECTED NONE DETECTED   Cannabinoid 50 Ng, Ur [place] POSITIVE (A) NONE DETECTED   Barbiturates, Ur Screen NONE DETECTED NONE DETECTED   Benzodiazepine, Ur Scrn NONE DETECTED NONE DETECTED   Methadone Scn, Ur NONE DETECTED NONE DETECTED    Comment: (NOTE) Tricyclics + metabolites, urine    Cutoff 1000 ng/mL Amphetamines + metabolites, urine  Cutoff 1000 ng/mL MDMA (Ecstasy), urine              Cutoff 500 ng/mL Cocaine Metabolite, urine          Cutoff 300 ng/mL Opiate + metabolites, urine        Cutoff 300 ng/mL Phencyclidine (PCP), urine         Cutoff 25 ng/mL Cannabinoid, urine                 Cutoff 50 ng/mL Barbiturates + metabolites, urine  Cutoff 200 ng/mL Benzodiazepine, urine              Cutoff 200 ng/mL Methadone, urine                   Cutoff 300 ng/mL  The urine drug screen provides only a preliminary, unconfirmed analytical test result and should not be used for non-medical purposes. Clinical consideration and  professional judgment should be applied to any positive drug screen result due to possible interfering substances. A more specific alternate chemical method must be used in order to obtain a confirmed analytical result. Gas chromatography / mass spectrometry (GC/MS) is the preferred confirm atory method. Performed at [facility], [address] [place], [place] [address]   Pregnancy, urine POC     Status: None   Collection Time: 03/24/20  4:25 PM  Result Value Ref Range   Preg Test, Ur NEGATIVE NEGATIVE    Comment:        THE SENSITIVITY OF THIS METHODOLOGY IS >24 mIU/mL     Current Facility-Administered Medications  Medication Dose Route Frequency Provider Last Rate Last Admin  . hydrOXYzine (ATARAX/VISTARIL) tablet 25 mg  25 mg Oral TID PRN Sharman Cheek, MD      . ibuprofen (ADVIL) tablet 400 mg  400 mg Oral Q6H PRN Sharman Cheek, MD      . risperiDONE (RISPERDAL) tablet 1 mg  1 mg Oral BID Sharman Cheek, MD   1 mg at 03/24/20 1524  . traZODone (DESYREL) tablet 50 mg  50 mg Oral QHS PRN Sharman Cheek, MD   50 mg at 03/24/20 1659   Current Outpatient Medications  Medication Sig Dispense Refill  . benztropine (COGENTIN) 0.5 MG tablet Take 1 tablet (0.5 mg total) by mouth 2 (two) times daily. 60 tablet 1  . haloperidol (HALDOL) 5 MG tablet Take 1 tablet (5 mg total) by mouth 2 (two)  times daily. 20 tablet 0  . [START ON 03/25/2020] haloperidol decanoate (HALDOL DECANOATE) 100 MG/ML injection Inject 1 mL (100 mg total) into the muscle every 30 (thirty) days. Next dose due on March 25, 2020 1 mL 1  . traZODone (DESYREL) 50 MG tablet Take 1 tablet (50 mg total) by mouth at bedtime as needed for sleep. 30 tablet 1  . ibuprofen (MOTRIN IB) 200 MG tablet Take 2 tablets (400 mg total) by mouth every 6 (six) hours as needed. (Patient not taking: Reported on 03/24/2020) 30 tablet 0    Musculoskeletal: Strength & Muscle Tone: within normal limits Gait & Station:  normal Patient leans: N/A  Psychiatric Specialty Exam: Physical Exam  Nursing note and vitals reviewed. Constitutional: She is oriented to person, place, and time. She appears well-developed. She is cooperative.  HENT:  Head: Normocephalic.  Eyes: Pupils are equal, round, and reactive to light.  Respiratory: Effort normal.  Musculoskeletal:        General: Normal range of motion.     Cervical back: Normal range of motion.  Neurological: She is alert and oriented to person, place, and time.  Skin: Skin is warm and dry.  Psychiatric: Her behavior is normal. Her affect is labile and inappropriate. Her speech is slurred. Thought content is paranoid and delusional. Cognition and memory are impaired. She expresses impulsivity and inappropriate judgment. She exhibits a depressed mood. She expresses suicidal ideation. She expresses suicidal plans.    Review of Systems  Psychiatric/Behavioral: Positive for behavioral problems, hallucinations and suicidal ideas.  All other systems reviewed and are negative.   Blood pressure 132/79, pulse 98, temperature 98.2 F (36.8 C), temperature source Oral, resp. rate 17, height 5\' 2"  (1.575 m), weight 81.6 kg, last menstrual period 02/25/2020, SpO2 98 %.Body mass index is 32.92 kg/m.  General Appearance: Bizarre  Eye Contact:  Minimal  Speech:  Slow and Slurred  Volume:  Decreased  Mood:  Medicated  Affect:  Blunt and Labile  Thought Process:  Coherent  Orientation:  Full (Time, Place, and Person)  Thought Content:  Logical and Tangential  Suicidal Thoughts:  Yes.  without intent/plan  Homicidal Thoughts:  No  Memory:  Immediate;   Fair  Judgement:  Impaired  Insight:  Lacking  Psychomotor Activity:  Normal  Concentration:  Concentration: Poor  Recall:  Poor  Fund of Knowledge:  Poor  Language:  Fair  Akathisia:  NA  Handed:  Right  AIMS (if indicated):     Assets:  Desire for Improvement Social Support  ADL's:  Intact  Cognition:   Impaired,  Moderate  Sleep:    Good     Treatment Plan Summary: Medication management and Plan Patient meets criteria for psychiatric inpatient admission.    Disposition: Recommend psychiatric Inpatient admission when medically cleared. Supportive therapy provided about ongoing stressors.  02/27/2020, NP 03/24/2020 11:09 PM

## 2020-03-24 NOTE — ED Notes (Signed)
Resumed care from dorothy rn.  Pt alert.  Pt took meds but states she want to just get out.  Pt is IVC.  Pt walking in her room.

## 2020-03-24 NOTE — ED Notes (Signed)
Meal tray given 

## 2020-03-24 NOTE — ED Notes (Signed)
Pt denies HI/VH on assessment. PT does endorse AH telling her to kill helself. Pt was able to contract for safety.

## 2020-03-24 NOTE — ED Provider Notes (Addendum)
[facility] Emergency Department Provider Note  ____________________________________________  Time seen: Approximately 3:18 PM  I have reviewed the triage vital signs and the nursing notes.   HISTORY  Chief Complaint Psychiatric Evaluation    Level 5 Caveat: Portions of the History and Physical including HPI and review of systems are unable to be completely obtained due to patient being a poor historian   HPI Madison Boyer is a 19 y.o. female with a past history of schizoaffective disorder, recently hospitalized May 2021 and old Vertis Kelch who is sent to the ED from [facility] under IVC due to auditory hallucinations and suicidal ideation with a plan to shoot herself.  Patient has reported the only reason she has not attempted suicide is that she does not currently have access to a gun.  IVC paperwork from [place] reviewed for additional information.  Patient denies any acute symptoms or pain.  Denies any self-injurious behaviors recently.      History reviewed. No pertinent past medical history.   Patient Active Problem List   Diagnosis Date Noted  . Schizoaffective disorder ([place]) 02/15/2020  . Dysmenorrhea      Past Surgical History:  Procedure Laterality Date  . APPENDECTOMY       Prior to Admission medications   Medication Sig Start Date End Date Taking? Authorizing Provider  benztropine (COGENTIN) 0.5 MG tablet Take 1 tablet (0.5 mg total) by mouth 2 (two) times daily. 02/25/20   Clapacs, Madie Reno, MD  haloperidol (HALDOL) 5 MG tablet Take 1 tablet (5 mg total) by mouth 2 (two) times daily. 02/25/20   Clapacs, Madie Reno, MD  haloperidol decanoate (HALDOL DECANOATE) 100 MG/ML injection Inject 1 mL (100 mg total) into the muscle every 30 (thirty) days. Next dose due on March 25, 2020 03/25/20   Clapacs, Madie Reno, MD  ibuprofen (MOTRIN IB) 200 MG tablet Take 2 tablets (400 mg total) by mouth every 6 (six) hours as needed. 11/19/18   Laban Emperor, PA-C   traZODone (DESYREL) 50 MG tablet Take 1 tablet (50 mg total) by mouth at bedtime as needed for sleep. 02/25/20   Clapacs, Madie Reno, MD     Allergies Patient has no known allergies.   Family History  Problem Relation Age of Onset  . Healthy Mother   . Healthy Father   . Healthy Maternal Grandmother   . Diabetes Maternal Grandfather   . Healthy Paternal Grandmother   . Healthy Paternal Grandfather     Social History Social History   Tobacco Use  . Smoking status: Never Smoker  . Smokeless tobacco: Never Used  Vaping Use  . Vaping Use: Some days  Substance Use Topics  . Alcohol use: Yes    Comment: occassionally  . Drug use: Yes    Types: Marijuana    Review of Systems Level 5 Caveat: Portions of the History and Physical including HPI and review of systems are unable to be completely obtained due to patient being a poor historian   Constitutional:   No known fever.  ENT:   No rhinorrhea. Cardiovascular:   No chest pain or syncope. Respiratory:   No dyspnea or cough. Gastrointestinal:   Negative for abdominal pain, vomiting and diarrhea.  Musculoskeletal:   Negative for focal pain or swelling ____________________________________________   PHYSICAL EXAM:  VITAL SIGNS: ED Triage Vitals [03/24/20 1426]  Enc Vitals Group     BP 134/87     Pulse Rate (!) 103     Resp 16  Temp 98.2 F (36.8 C)     Temp Source Oral     SpO2 98 %     Weight 180 lb (81.6 kg)     Height 5\' 2"  (1.575 m)     Head Circumference      Peak Flow      Pain Score 0     Pain Loc      Pain Edu?      Excl. in GC?     Vital signs reviewed, nursing assessments reviewed.   Constitutional:   Alert and oriented. Non-toxic appearance. Eyes:   Conjunctivae are normal. EOMI.  ENT      Head:   Normocephalic and atraumatic.      Nose:   No congestion/rhinnorhea.          Neck:   No meningismus. Full ROM.  Cardiovascular:   RRR. Symmetric bilateral radial and DP pulses.  No murmurs. Cap  refill less than 2 seconds. Respiratory:   Normal respiratory effort without tachypnea/retractions. Breath sounds are clear and equal bilaterally. No wheezes/rales/rhonchi. Gastrointestinal:   Soft and nontender. Non distended. There is no CVA tenderness.  No rebound, rigidity, or guarding. Musculoskeletal:   Normal range of motion in all extremities. No joint effusions.  No lower extremity tenderness.  No edema. Neurologic:   Normal speech, somewhat disorganized thought process Depressed affect Motor grossly intact. Skin:    Skin is warm, dry and intact. No rash noted.  No petechiae, purpura, or bullae.  No wounds  ____________________________________________    LABS (pertinent positives/negatives) (all labs ordered are listed, but only abnormal results are displayed) Labs Reviewed  SALICYLATE LEVEL - Abnormal; Notable for the following components:      Result Value   Salicylate Lvl <7.0 (*)    All other components within normal limits  ACETAMINOPHEN LEVEL - Abnormal; Notable for the following components:   Acetaminophen (Tylenol), Serum <10 (*)    All other components within normal limits  CBC - Abnormal; Notable for the following components:   WBC 15.8 (*)    Hemoglobin 15.1 (*)    All other components within normal limits  SARS CORONAVIRUS 2 BY RT PCR (HOSPITAL ORDER, PERFORMED IN [facility])  COMPREHENSIVE METABOLIC PANEL  ETHANOL  URINE DRUG SCREEN, QUALITATIVE ([facility] ONLY)  POC URINE PREG, ED   ____________________________________________   EKG    ____________________________________________    RADIOLOGY  No results found.  ____________________________________________   PROCEDURES Procedures  ____________________________________________    CLINICAL IMPRESSION / ASSESSMENT AND PLAN / ED COURSE  Medications ordered in the ED: Medications  traZODone (DESYREL) tablet 50 mg (has no administration in time range)  ibuprofen (ADVIL) tablet 400  mg (has no administration in time range)  hydrOXYzine (ATARAX/VISTARIL) tablet 25 mg (has no administration in time range)  risperiDONE (RISPERDAL) tablet 1 mg (has no administration in time range)  Norethin-Eth Estradiol-Fe [facility] FE) tablet 1 tablet (has no administration in time range)    Pertinent labs & imaging results that were available during my care of the patient were reviewed by me and considered in my medical decision making (see chart for details).   Madison Boyer was evaluated in Emergency Department on 03/24/2020 for the symptoms described in the history of present illness. She was evaluated in the context of the global COVID-19 pandemic, which necessitated consideration that the patient might be at risk for infection with the SARS-CoV-2 virus that causes COVID-19. Institutional protocols and algorithms that pertain to the  evaluation of patients at risk for COVID-19 are in a state of rapid change based on information released by regulatory bodies including the CDC and federal and state organizations. These policies and algorithms were followed during the patient's care in the ED.   Patient presents with symptoms of acute psychosis.  Possibly substance-induced although her prior history is suggestive of an underlying psychiatric condition.  She arrives under IVC which will be maintained in the ED pending further psychiatric evaluation and disposition.  I will restart home medications that I am able to confirm.  Patient is medically cleared.  The patient has been placed in psychiatric observation due to the need to provide a safe environment for the patient while obtaining psychiatric consultation and evaluation, as well as ongoing medical and medication management to treat the patient's condition.  The patient has been placed under full IVC at this time.   Clinical Course as of Mar 25 1847  Tue Mar 24, 2020  1842 Pt becoming increasingly agitated, wanting to roam the ED and  hospital, banging on window and walls. I'm worried she will hurt herself, so I am ordering IM haldol and ativan to help calm her.    [PS]    Clinical Course User Index [PS] Sharman Cheek, MD     ----------------------------------------- 10:14 PM on 03/24/2020 -----------------------------------------  Discussed with psychiatry who plans to admit.  ____________________________________________   FINAL CLINICAL IMPRESSION(S) / ED DIAGNOSES    Final diagnoses:  Acute psychosis [facility])     ED Discharge Orders    None      Portions of this note were generated with dragon dictation software. Dictation errors may occur despite best attempts at proofreading.   Sharman Cheek, MD 03/24/20 1522    Sharman Cheek, MD 03/24/20 [place]    Sharman Cheek, MD 03/24/20 2214

## 2020-03-24 NOTE — ED Notes (Signed)
tts in with pt now.  Pt lying on stretcher.

## 2020-03-25 ENCOUNTER — Inpatient Hospital Stay
Admission: EM | Admit: 2020-03-25 | Discharge: 2020-03-31 | DRG: 885 | Disposition: A | Discharge status: Home / Self Care | Payer: Medicaid Other | Source: Ambulatory Visit | Attending: Psychiatry | Admitting: Psychiatry

## 2020-03-25 DIAGNOSIS — F250 Schizoaffective disorder, bipolar type: Secondary | ICD-10-CM

## 2020-03-25 DIAGNOSIS — F23 Brief psychotic disorder: Secondary | ICD-10-CM | POA: Diagnosis present

## 2020-03-25 DIAGNOSIS — F259 Schizoaffective disorder, unspecified: Secondary | ICD-10-CM | POA: Diagnosis present

## 2020-03-25 DIAGNOSIS — F1210 Cannabis abuse, uncomplicated: Secondary | ICD-10-CM | POA: Diagnosis present

## 2020-03-25 LAB — LIPID PANEL
Cholesterol: 160 mg/dL (ref 0–169)
HDL: 56 mg/dL (ref >40)
LDL Cholesterol: 86 mg/dL (ref 0–99)
Total CHOL/HDL Ratio: 2.9 RATIO
Triglycerides: 88 mg/dL (ref <150)
VLDL: 18 mg/dL (ref 0–40)

## 2020-03-25 MED ORDER — ACETAMINOPHEN 325 MG PO TABS
650.0000 mg | ORAL_TABLET | Freq: Four times a day (QID) | ORAL | Status: DC | PRN
  Administered 2020-03-25 – 2020-03-27 (×3): 650 mg via ORAL
  Filled 2020-03-25 (×3): qty 2

## 2020-03-25 MED ORDER — BENZTROPINE MESYLATE 1 MG PO TABS
0.5000 mg | ORAL_TABLET | Freq: Two times a day (BID) | ORAL | Status: DC
  Administered 2020-03-25 – 2020-03-31 (×13): 0.5 mg via ORAL
  Filled 2020-03-25 (×13): qty 1

## 2020-03-25 MED ORDER — MAGNESIUM HYDROXIDE 400 MG/5ML PO SUSP
30.0000 mL | Freq: Every day | ORAL | Status: DC | PRN
  Administered 2020-03-25 – 2020-03-29 (×5): 30 mL via ORAL
  Filled 2020-03-25 (×5): qty 30

## 2020-03-25 MED ORDER — HALOPERIDOL 5 MG PO TABS
5.0000 mg | ORAL_TABLET | Freq: Two times a day (BID) | ORAL | Status: DC
  Administered 2020-03-25 – 2020-03-29 (×8): 5 mg via ORAL
  Filled 2020-03-25 (×8): qty 1

## 2020-03-25 MED ORDER — HYDROXYZINE HCL 25 MG PO TABS
25.0000 mg | ORAL_TABLET | Freq: Three times a day (TID) | ORAL | Status: DC | PRN
  Administered 2020-03-25 – 2020-03-29 (×8): 25 mg via ORAL
  Filled 2020-03-25 (×8): qty 1

## 2020-03-25 MED ORDER — RISPERIDONE 1 MG PO TABS
1.0000 mg | ORAL_TABLET | Freq: Two times a day (BID) | ORAL | Status: DC
  Administered 2020-03-25: 1 mg via ORAL
  Filled 2020-03-25: qty 1

## 2020-03-25 MED ORDER — TRAZODONE HCL 50 MG PO TABS
50.0000 mg | ORAL_TABLET | Freq: Every evening | ORAL | Status: DC | PRN
  Administered 2020-03-25 – 2020-03-30 (×5): 50 mg via ORAL
  Filled 2020-03-25 (×6): qty 1

## 2020-03-25 MED ORDER — ALUM & MAG HYDROXIDE-SIMETH 200-200-20 MG/5ML PO SUSP: 30.0000 mL | ORAL | Status: DC | PRN

## 2020-03-25 MED ORDER — IBUPROFEN 200 MG PO TABS
400.0000 mg | ORAL_TABLET | Freq: Four times a day (QID) | ORAL | Status: DC | PRN
  Administered 2020-03-29: 400 mg via ORAL
  Filled 2020-03-25 (×2): qty 2

## 2020-03-25 MED ORDER — HALOPERIDOL DECANOATE 100 MG/ML IM SOLN
100.0000 mg | INTRAMUSCULAR | Status: DC
  Administered 2020-03-25: 100 mg via INTRAMUSCULAR
  Filled 2020-03-25: qty 1

## 2020-03-25 NOTE — Progress Notes (Signed)
Recreation Therapy Notes    Date: 03/25/2020  Time: 9:30 am   Location: Craft room    Behavioral response: N/A   Intervention Topic: Self-esteem    Discussion/Intervention: Patient did not attend group.   Clinical Observations/Feedback:  Patient did not attend group.   Keighley Deckman LRT/CTRS        Zhuri Krass 03/25/2020 11:01 AM

## 2020-03-25 NOTE — Plan of Care (Signed)
Patient new to the unit tonight  Problem: Education: Goal: Knowledge of [facility] Education information/materials will improve Outcome: Not Progressing Goal: Emotional status will improve Outcome: Not Progressing Goal: Mental status will improve Outcome: Not Progressing Goal: Verbalization of understanding the information provided will improve Outcome: Not Progressing   Problem: Safety: Goal: Periods of time without injury will increase Outcome: Not Progressing   Problem: Activity: Goal: Will verbalize the importance of balancing activity with adequate rest periods Outcome: Not Progressing   Problem: Safety: Goal: Ability to redirect hostility and anger into socially appropriate behaviors will improve Outcome: Not Progressing Goal: Ability to remain free from injury will improve Outcome: Not Progressing   

## 2020-03-25 NOTE — BHH Suicide Risk Assessment (Signed)
[facility] Admission Suicide Risk Assessment   Nursing information obtained from:  Patient Demographic factors:  NA Current Mental Status:  NA Loss Factors:  NA Historical Factors:  NA Risk Reduction Factors:  Positive social support  Total Time spent with patient: 1 hour Principal Problem: Schizoaffective disorder (HCC) Diagnosis:  Principal Problem:   Schizoaffective disorder (HCC) Active Problems:   Acute psychosis (HCC)   Cannabis abuse  Subjective Data: Patient seen.  Patient known from previous encounters as well.  19 year old with a history of psychotic disorder brought to the hospital from [facility] because of psychosis.  On interview today the patient denies any suicidal or homicidal thought.  Denies having any current hallucinations.  She will only say that she was sent here "to get my shot".  So far has been at least passively cooperative.  Continued Clinical Symptoms:  Alcohol Use Disorder Identification Test Final Score (AUDIT): 3 The "Alcohol Use Disorders Identification Test", Guidelines for Use in Primary Care, Second Edition.  World Science writer [facility]). Score between 0-7:  no or low risk or alcohol related problems. Score between 8-15:  moderate risk of alcohol related problems. Score between 16-19:  high risk of alcohol related problems. Score 20 or above:  warrants further diagnostic evaluation for alcohol dependence and treatment.   CLINICAL FACTORS:   Schizophrenia:   Less than 11 years old   Musculoskeletal: Strength & Muscle Tone: within normal limits Gait & Station: normal Patient leans: N/A  Psychiatric Specialty Exam: Physical Exam  Nursing note and vitals reviewed. Constitutional: She appears well-developed.  HENT:  Head: Normocephalic and atraumatic.  Eyes: Pupils are equal, round, and reactive to light. Conjunctivae are normal.  Cardiovascular: Normal heart sounds.  Respiratory: Effort normal.  GI: Soft.  Musculoskeletal:        General: Normal  range of motion.     Cervical back: Normal range of motion.  Neurological: She is alert.  Skin: Skin is warm and dry.  Psychiatric: Her affect is labile and inappropriate. She is agitated. Thought content is paranoid. She expresses inappropriate judgment. She expresses no homicidal and no suicidal ideation. She is noncommunicative. She is inattentive.    Review of Systems  Constitutional: Negative.   HENT: Negative.   Eyes: Negative.   Respiratory: Negative.   Cardiovascular: Negative.   Gastrointestinal: Negative.   Musculoskeletal: Negative.   Skin: Negative.   Neurological: Negative.   Psychiatric/Behavioral: Positive for behavioral problems, dysphoric mood and sleep disturbance.    Blood pressure 126/81, pulse 93, temperature 98.3 F (36.8 C), temperature source Oral, resp. rate 18, height 5\' 2"  (1.575 m), weight 81.6 kg, last menstrual period 02/25/2020, SpO2 98 %.Body mass index is 32.9 kg/m.  General Appearance: Casual  Eye Contact:  Minimal  Speech:  Garbled and Slow  Volume:  Decreased  Mood:  Dysphoric  Affect:  Inappropriate  Thought Process:  Disorganized  Orientation:  Full (Time, Place, and Person)  Thought Content:  Illogical, Rumination and Tangential  Suicidal Thoughts:  No  Homicidal Thoughts:  No  Memory:  Immediate;   Fair Recent;   Poor Remote;   Poor  Judgement:  Impaired  Insight:  Shallow  Psychomotor Activity:  Restlessness  Concentration:  Concentration: Poor  Recall:  Poor  Fund of Knowledge:  Poor  Language:  Fair  Akathisia:  No  Handed:  Right  AIMS (if indicated):     Assets:  Desire for Improvement Housing Physical Health Social Support  ADL's:  Impaired  Cognition:  Impaired,  Mild  Sleep:  Number of Hours: 3      COGNITIVE FEATURES THAT CONTRIBUTE TO RISK:  Polarized thinking    SUICIDE RISK:   Mild:  Suicidal ideation of limited frequency, intensity, duration, and specificity.  There are no identifiable plans, no  associated intent, mild dysphoria and related symptoms, good self-control (both objective and subjective assessment), few other risk factors, and identifiable protective factors, including available and accessible social support.  PLAN OF CARE: Continue 15-minute checks.  Continue appropriate antipsychotic medication.  Include in individual and group therapy reassess dangerousness prior to discharge  I certify that inpatient services furnished can reasonably be expected to improve the patient's condition.   Alethia Berthold, MD 03/25/2020, 2:31 PM

## 2020-03-25 NOTE — Tx Team (Signed)
Initial Treatment Plan 03/25/2020 1:33 AM Madison Boyer OBS:[id number]    PATIENT STRESSORS: Medication change or noncompliance Substance abuse   PATIENT STRENGTHS: Ability for insight Motivation for treatment/growth   PATIENT IDENTIFIED PROBLEMS: Acute Psychosis  Depression  Anxiety                 DISCHARGE CRITERIA:  Improved stabilization in mood, thinking, and/or behavior Motivation to continue treatment in a less acute level of care  PRELIMINARY DISCHARGE PLAN: Outpatient therapy Return to previous living arrangement  PATIENT/FAMILY INVOLVEMENT: This treatment plan has been presented to and reviewed with the patient, Madison Boyer. The patient has been given the opportunity to ask questions and make suggestions.  Elmyra Ricks, RN 03/25/2020, 1:33 AM

## 2020-03-25 NOTE — Plan of Care (Signed)
D: Pt alert and oriented x 4. Pt rates anxiety 5/10. Pt reports experiencing 5/10 pain from experiencing a headache, prn meds given. Pt denies experiencing any SI/HI, or VH at this time. Pt reports experiencing AH, pt states she hears her own voice telling her to kill herself. Pt reports that she does not want to die and feels safe while here.  When asked what pt's coping skills are pt stated listening to music and walking. When asked what other coping skills she has learned from group pt states she does not attend group. When asked why, pt states she doesn't like group. When stated pt should attend groups so that she could learn new coping skills pt became very whiny and stated she doesn't want to go to group and she doesn't like it. It was then stated that she should at least give it a try.   Pt begins pacing halls this evening stopping at the end of each hall shaking hands in the air with arms extended. Pt appears to be reacting to internal stimuli. When asked what she is thinking when pacing the halls pt states she feels like she can hear everyone talking. Pt's anxiety was rated and prn meds were given.  A: Scheduled medications administered to pt, per MD orders. Support and encouragement provided. Frequent verbal contact made. Routine safety checks conducted q15 minutes.   R: No adverse drug reactions noted. Pt verbally contracts for safety at this time. Pt complaint with medications and treatment plan. Pt interacts well with others on the unit. Pt remains safe at this time. Will continue to monitor.   Problem: Education: Goal: Knowledge of [facility] Education information/materials will improve Outcome: Not Progressing Goal: Emotional status will improve Outcome: Not Progressing Goal: Mental status will improve Outcome: Not Progressing Goal: Verbalization of understanding the information provided will improve Outcome: Not Progressing   Problem: Safety: Goal: Periods of time  without injury will increase Outcome: Not Progressing   Problem: Activity: Goal: Will verbalize the importance of balancing activity with adequate rest periods Outcome: Not Progressing   Problem: Safety: Goal: Ability to redirect hostility and anger into socially appropriate behaviors will improve Outcome: Not Progressing Goal: Ability to remain free from injury will improve Outcome: Not Progressing

## 2020-03-25 NOTE — BHH Counselor (Signed)
CSW made two attempts to meet with pt to complete PSA. Pt was asleep and asked CSW to come back tomorrow to complete assessment.

## 2020-03-25 NOTE — Progress Notes (Signed)
Patient was pleasant and easy to engage.  She is active on the unit and engages well with other peers.  She approached the nurses station about an hour before meds to ask for Trazodone stating she was ready to go to sleep. She has exhibited childlike behavior when she approaches the nurses station. She denies SI/HI/VH and depression.  She continues to endorse audio stimuli and anxiety.  She received her prescribed medications and tolerated without incident. She remains safe on the unit and informed to contact staff with any concerns.

## 2020-03-25 NOTE — BHH Group Notes (Signed)
Emotional Regulation 03/25/2020 1PM  Type of Therapy/Topic:  Group Therapy:  Emotion Regulation  Participation Level:  None   Description of Group:   The purpose of this group is to assist patients in learning to regulate negative emotions and experience positive emotions. Patients will be guided to discuss ways in which they have been vulnerable to their negative emotions. These vulnerabilities will be juxtaposed with experiences of positive emotions or situations, and patients will be challenged to use positive emotions to combat negative ones. Special emphasis will be placed on coping with negative emotions in conflict situations, and patients will process healthy conflict resolution skills.  Therapeutic Goals: 1. Patient will identify two positive emotions or experiences to reflect on in order to balance out negative emotions 2. Patient will label two or more emotions that they find the most difficult to experience 3. Patient will demonstrate positive conflict resolution skills through discussion and/or role plays  Summary of Patient Progress: Pt attended group but no participation during session. Pt sat quietly and was removed from group early by patient registration, did not return.   Therapeutic Modalities:   Cognitive Behavioral Therapy Feelings Identification Dialectical Behavioral Therapy   Suzan Slick, LCSW 03/25/2020 2:36 PM

## 2020-03-25 NOTE — ED Notes (Signed)
Report called to [facility] RN beh rn

## 2020-03-25 NOTE — H&P (Signed)
Psychiatric Admission Assessment Adult  Patient Identification: Madison Boyer MRN:  [id number] Date of Evaluation:  03/25/2020 Chief Complaint:  Acute psychosis [facility]) [F23] Principal Diagnosis: Schizoaffective disorder (HCC) Diagnosis:  Principal Problem:   Schizoaffective disorder (HCC) Active Problems:   Acute psychosis (HCC)   Cannabis abuse  History of Present Illness: Patient seen chart reviewed.  Patient known from previous encounters.  19 year old woman with psychotic disorder brought to the hospital from [facility] under commitment because of return of psychosis.  Patient is limited in her ability to give a history.  She tells me all she knows is that she was brought here "to get my shot".  Patient minimizes symptoms.  Denies suicidal or homicidal ideation.  Denies active hallucinations.  Does appear to be disorganized in her thinking and it sounds like she has not been functioning very well over the last month.  She was here in the hospital a month ago and was discharged with recommendation to be followed up with [facility] it is not clear however that she ever saw anyone there and she did not get her follow-up Haldol Decanoate shot yet.  Patient admits to ongoing use of marijuana regularly denies alcohol use or other drug use.  So far has been reasonably cooperative on the unit. Associated Signs/Symptoms: Depression Symptoms:  anhedonia, psychomotor agitation, impaired memory, (Hypo) Manic Symptoms:  Impulsivity, Anxiety Symptoms:  Panic Symptoms, Psychotic Symptoms:  Paranoia, PTSD Symptoms: Negative Total Time spent with patient: 1 hour  Past Psychiatric History: Patient has had multiple hospitalizations over the past year or so.  Seems to have developed psychotic disorder relatively recently but had several hospitalizations.  She was here just a month ago and at that time was stabilized on haloperidol primarily.  She has had hospitalizations at [facility] as [facility].  Denies any  history of suicide attempts.  Does have a history of misuse and overuse of cannabis thought to be partially related to symptoms at times  Is the patient at risk to self? Yes.    Has the patient been a risk to self in the past 6 months? Yes.    Has the patient been a risk to self within the distant past? Yes.    Is the patient a risk to others? No.  Has the patient been a risk to others in the past 6 months? No.  Has the patient been a risk to others within the distant past? No.   Prior Inpatient Therapy:   Prior Outpatient Therapy:    Alcohol Screening: 1. How often do you have a drink containing alcohol?: 2 to 4 times a month 2. How many drinks containing alcohol do you have on a typical day when you are drinking?: 1 or 2 3. How often do you have six or more drinks on one occasion?: Less than monthly AUDIT-C Score: 3 4. How often during the last year have you found that you were not able to stop drinking once you had started?: Never 5. How often during the last year have you failed to do what was normally expected from you because of drinking?: Never 6. How often during the last year have you needed a first drink in the morning to get yourself going after a heavy drinking session?: Never 7. How often during the last year have you had a feeling of guilt of remorse after drinking?: Never 8. How often during the last year have you been unable to remember what happened the night before because you had been  drinking?: Never 9. Have you or someone else been injured as a result of your drinking?: No 10. Has a relative or friend or a doctor or another health worker been concerned about your drinking or suggested you cut down?: No Alcohol Use Disorder Identification Test Final Score (AUDIT): 3 Alcohol Brief Interventions/Follow-up: AUDIT Score <7 follow-up not indicated Substance Abuse History in the last 12 months:  Yes.   Consequences of Substance Abuse: Negative Previous Psychotropic  Medications: Yes  Psychological Evaluations: Yes  Past Medical History: History reviewed. No pertinent past medical history.  Past Surgical History:  Procedure Laterality Date  . APPENDECTOMY     Family History:  Family History  Problem Relation Age of Onset  . Healthy Mother   . Healthy Father   . Healthy Maternal Grandmother   . Diabetes Maternal Grandfather   . Healthy Paternal Grandmother   . Healthy Paternal Grandfather    Family Psychiatric  History: See previous.  No reported mental health history Tobacco Screening: Have you used any form of tobacco in the last 30 days? (Cigarettes, Smokeless Tobacco, Cigars, and/or Pipes): No Social History:  Social History   Substance and Sexual Activity  Alcohol Use Yes   Comment: occassionally     Social History   Substance and Sexual Activity  Drug Use Yes  . Types: Marijuana    Additional Social History:                           Allergies:  No Known Allergies Lab Results:  Results for orders placed or performed during the hospital encounter of 03/25/20 (from the past 48 hour(s))  Lipid panel     Status: None   Collection Time: 03/25/20 12:19 PM  Result Value Ref Range   Cholesterol 160 0 - 169 mg/dL   Triglycerides 88 <631 mg/dL   HDL 56 >49 mg/dL   Total CHOL/HDL Ratio 2.9 RATIO   VLDL 18 0 - 40 mg/dL   LDL Cholesterol 86 0 - 99 mg/dL    Comment:        Total Cholesterol/HDL:CHD Risk Coronary Heart Disease Risk Table                     Men   Women  1/2 Average Risk   3.4   3.3  Average Risk       5.0   4.4  2 X Average Risk   9.6   7.1  3 X Average Risk  23.4   11.0        Use the calculated Patient Ratio above and the CHD Risk Table to determine the patient's CHD Risk.        ATP III CLASSIFICATION (LDL):  <100     mg/dL   Optimal  702-637  mg/dL   Near or Above                    Optimal  130-159  mg/dL   Borderline  858-850  mg/dL   High  >277     mg/dL   Very High Performed at [facility], [address], [place], [place] [address]     Blood Alcohol level:  Lab Results  Component Value Date   [facility] <10 03/24/2020   ETH <10 02/15/2020    Metabolic Disorder Labs:  Lab Results  Component Value Date   HGBA1C 4.8 09/04/2018   MPG 91.06 09/04/2018  No results found for: PROLACTIN Lab Results  Component Value Date   CHOL 160 03/25/2020   TRIG 88 03/25/2020   HDL 56 03/25/2020   CHOLHDL 2.9 03/25/2020   VLDL 18 03/25/2020   LDLCALC 86 03/25/2020   LDLCALC 76 09/04/2018    Current Medications: Current Facility-Administered Medications  Medication Dose Route Frequency Provider Last Rate Last Admin  . acetaminophen (TYLENOL) tablet 650 mg  650 mg Oral Q6H PRN Gillermo Murdoch, NP   650 mg at 03/25/20 0753  . alum & mag hydroxide-simeth (MAALOX/MYLANTA) 200-200-20 MG/5ML suspension 30 mL  30 mL Oral Q4H PRN Gillermo Murdoch, NP      . benztropine (COGENTIN) tablet 0.5 mg  0.5 mg Oral BID Gillermo Murdoch, NP   0.5 mg at 03/25/20 0753  . haloperidol (HALDOL) tablet 5 mg  5 mg Oral BID Jenisse Vullo T, MD      . haloperidol decanoate (HALDOL DECANOATE) 100 MG/ML injection 100 mg  100 mg Intramuscular Q30 days Arnette Driggs T, MD   100 mg at 03/25/20 1257  . hydrOXYzine (ATARAX/VISTARIL) tablet 25 mg  25 mg Oral TID PRN Gillermo Murdoch, NP   25 mg at 03/25/20 0818  . ibuprofen (ADVIL) tablet 400 mg  400 mg Oral Q6H PRN Gillermo Murdoch, NP      . magnesium hydroxide (MILK OF MAGNESIA) suspension 30 mL  30 mL Oral Daily PRN Gillermo Murdoch, NP      . traZODone (DESYREL) tablet 50 mg  50 mg Oral QHS PRN Gillermo Murdoch, NP       PTA Medications: Medications Prior to Admission  Medication Sig Dispense Refill Last Dose  . benztropine (COGENTIN) 0.5 MG tablet Take 1 tablet (0.5 mg total) by mouth 2 (two) times daily. 60 tablet 1   . haloperidol (HALDOL) 5 MG tablet Take 1 tablet (5 mg total) by mouth 2 (two) times daily. 20 tablet  0   . haloperidol decanoate (HALDOL DECANOATE) 100 MG/ML injection Inject 1 mL (100 mg total) into the muscle every 30 (thirty) days. Next dose due on March 25, 2020 1 mL 1   . ibuprofen (MOTRIN IB) 200 MG tablet Take 2 tablets (400 mg total) by mouth every 6 (six) hours as needed. (Patient not taking: Reported on 03/24/2020) 30 tablet 0   . traZODone (DESYREL) 50 MG tablet Take 1 tablet (50 mg total) by mouth at bedtime as needed for sleep. 30 tablet 1     Musculoskeletal: Strength & Muscle Tone: within normal limits Gait & Station: normal Patient leans: N/A  Psychiatric Specialty Exam: Physical Exam  Nursing note and vitals reviewed. Constitutional: She appears well-developed.  HENT:  Head: Normocephalic and atraumatic.  Eyes: Pupils are equal, round, and reactive to light. Conjunctivae are normal.  Cardiovascular: Normal heart sounds.  Respiratory: Effort normal.  GI: Soft.  Musculoskeletal:        General: Normal range of motion.     Cervical back: Normal range of motion.  Neurological: She is alert.  Skin: Skin is warm and dry.  Psychiatric: Her mood appears anxious. Her affect is inappropriate. Her speech is tangential. She is agitated and withdrawn. Thought content is paranoid. She expresses inappropriate judgment. She expresses no homicidal and no suicidal ideation. She is noncommunicative. She is inattentive.    Review of Systems  Constitutional: Negative.   HENT: Negative.   Eyes: Negative.   Respiratory: Negative.   Cardiovascular: Negative.   Gastrointestinal: Negative.   Musculoskeletal: Negative.   Skin: Negative.  Neurological: Negative.   Psychiatric/Behavioral: Positive for confusion. The patient is nervous/anxious.     Blood pressure 126/81, pulse 93, temperature 98.3 F (36.8 C), temperature source Oral, resp. rate 18, height 5\' 2"  (1.575 m), weight 81.6 kg, last menstrual period 02/25/2020, SpO2 98 %.Body mass index is 32.9 kg/m.  General Appearance:  Disheveled  Eye Contact:  Minimal  Speech:  Garbled  Volume:  Decreased  Mood:  Irritable  Affect:  Inappropriate  Thought Process:  Disorganized  Orientation:  Full (Time, Place, and Person)  Thought Content:  Illogical  Suicidal Thoughts:  No  Homicidal Thoughts:  No  Memory:  Immediate;   Fair Recent;   Fair Remote;   Fair  Judgement:  Impaired  Insight:  Shallow  Psychomotor Activity:  Restlessness  Concentration:  Concentration: Poor  Recall:  Poor  Fund of Knowledge:  Poor  Language:  Fair  Akathisia:  No  Handed:  Right  AIMS (if indicated):     Assets:  Desire for Improvement Housing Physical Health  ADL's:  Impaired  Cognition:  Impaired,  Mild  Sleep:  Number of Hours: 3    Treatment Plan Summary: Daily contact with patient to assess and evaluate symptoms and progress in treatment, Medication management and Plan 19 year old with psychotic disorder tentative diagnosis of schizophrenia.  Had appeared to be responding reasonably well to haloperidol.  It has been a month since her last injection and symptoms may have worsened.  Patient is denying any suicidal or homicidal ideation and minimizing symptoms.  She is agreeable to restarting medication.  Switch back to haloperidol and restart the long-acting injectable.  Continue 15-minute checks.  Observation Level/Precautions:  15 minute checks  Laboratory:  Chemistry Profile  Psychotherapy:    Medications:    Consultations:    Discharge Concerns:    Estimated LOS:  Other:     Physician Treatment Plan for Primary Diagnosis: Schizoaffective disorder ([place]) Long Term Goal(s): Improvement in symptoms so as ready for discharge  Short Term Goals: Ability to verbalize feelings will improve, Ability to demonstrate self-control will improve and Ability to identify and develop effective coping behaviors will improve  Physician Treatment Plan for Secondary Diagnosis: Principal Problem:   Schizoaffective disorder  ([place]) Active Problems:   Acute psychosis ([place])   Cannabis abuse  Long Term Goal(s): Improvement in symptoms so as ready for discharge  Short Term Goals: Compliance with prescribed medications will improve  I certify that inpatient services furnished can reasonably be expected to improve the patient's condition.    Alethia Berthold, MD 6/23/20212:39 PM

## 2020-03-25 NOTE — Plan of Care (Signed)
  Problem: Education: Goal: Knowledge of [place] [facility] Education information/materials will improve Outcome: Not Progressing Goal: Emotional status will improve Outcome: Not Progressing Goal: Mental status will improve Outcome: Not Progressing Goal: Verbalization of understanding the information provided will improve Outcome: Not Progressing   Problem: Safety: Goal: Periods of time without injury will increase Outcome: Not Progressing   Problem: Activity: Goal: Will verbalize the importance of balancing activity with adequate rest periods Outcome: Not Progressing   Problem: Safety: Goal: Ability to redirect hostility and anger into socially appropriate behaviors will improve Outcome: Not Progressing Goal: Ability to remain free from injury will improve Outcome: Not Progressing

## 2020-03-25 NOTE — Progress Notes (Signed)
Patient admitted from [facility] - ED, report received from Amy, RN. Skin check completed with Marylu Lund, RN, no abnormalities found. No contraband found. Patient familiar with the unit from previous admissions. Patient tired from night medications and put in bed. Patient stated she was feeling depressed and was hearing voices. Patient also stated that she was feeling suicidal but doesn't currently feel that way and verbally contracts for safety. Patient being monitored Q 15 minutes for safety per unit protocol. Patient remains safe on the unit.

## 2020-03-25 NOTE — BH Assessment (Signed)
Patient is to be admitted to [facility] by Psychiatric Nurse Practitioner Gillermo Murdoch.  Attending Physician will be Dr. Toni Amend.   Patient has been assigned to room 315, by [facility] Charge Nurse Molli Hazard.

## 2020-03-26 ENCOUNTER — Encounter: Payer: Self-pay | Admitting: Certified Nurse Midwife

## 2020-03-26 NOTE — BHH Counselor (Signed)
Adult Comprehensive Assessment  Patient ID: Madison Boyer, female   DOB: 2001/07/30, 19 y.o.   MRN: [id number]  Information Source:   Information source: Patient. Chart review completed, pt last seen at [facility] 02/17/20   Current Stressors:  Patient states their primary concerns and needs for treatment are:: "I came to get my shot" Patient states their goals for this hospitalization and ongoing recovery are:: "I dont know" Educational / Learning stressors: None reported Employment / Job issues: Unemployed Family Relationships: No stressor reported Surveyor, quantity / Lack of resources (include bankruptcy): No income Housing / Lack of housing: Stable housing, lives with parents Physical health (include injuries & life threatening diseases): None reported Substance abuse: Pt reports alcohol use: I had two beers before I came here marijuana use: delta 8 Bereavement / Loss: None reported   Living/Environment/Situation:  Living Arrangements: Parents Living conditions (as described by patient or guardian): Pt reports living with parents Who else lives in the home?: Pt, parents, three younger siblings How long has patient lived in current situation?: "All my life" What is atmosphere in current home: Comfortable   Family History:  Marital status: Single Does patient have children?: No   Childhood History:  By whom was/is the patient raised?: Both parents Description of patient's relationship with caregiver when they were a child: "Good" Patient's description of current relationship with people who raised him/her: "Good" Does patient have siblings?: Yes Number of Siblings: 3 Description of patient's current relationship with siblings: "We get along" Did patient suffer any verbal/emotional/physical/sexual abuse as a child?: No Did patient suffer from severe childhood neglect?: No Has patient ever been sexually abused/assaulted/raped as an adolescent or adult?: No Was the patient  ever a victim of a crime or a disaster?: No Witnessed domestic violence?: No Has patient been effected by domestic violence as an adult?: No   Education:  Highest grade of school patient has completed: 12th Currently a student?: No Learning disability?: No   Employment/Work Situation:   Employment situation: Unemployed Patient's job has been impacted by current illness: No What is the longest time patient has a held a job?: Pt denies having been employed Did Secretary/administrator Any Psychiatric Treatment/Services While in Equities trader?: No Are There Guns or Other Weapons in Your Home?: No Are These Comptroller?: (Pt denies access)   Financial Resources:   Financial resources: Support from parents / caregiver Does patient have a Lawyer or guardian?: No   Alcohol/Substance Abuse:   What has been your use of drugs/alcohol within the last 12 months?: Alcohol, marijuana Alcohol/Substance Abuse Treatment Hx: Denies past history Has alcohol/substance abuse ever caused legal problems?: No   Social Support System:   Conservation officer, nature Support System: Production assistant, radio System: "Parents" Type of faith/religion: Engineer, maintenance:   Leisure and Hobbies: "Walking"   Strengths/Needs:   What is the patient's perception of their strengths?: "Nothing"   Discharge Plan:   Currently receiving community mental health services: No Patient states concerns and preferences for aftercare planning are: Pt reports her PCP at [facility] prescribes her psychiatric medication Patient states they will know when they are safe and ready for discharge when: Pt states she would like to continue seeing PCP for medication management. Pt declines referral for outpatient treatment. Does patient have access to transportation?: Yes Does patient have financial barriers related to discharge medications?: No Patient description of barriers related to discharge  medications: N/A Will patient be returning to same living  situation after discharge?: Yes(Lives with parents)    Summary/Recommendations:   Summary and Recommendations (to be completed by the evaluator): Pt is an 19 yr old female with a history of schizoaffective disorder, who presents to the ED due to return of psychosis. Pt endorses substance use. Pt declines referral for outpatient treatment and states she would like to follow up with her PCP at [facility] for medication management. Pt last seen at [facility] 02/17/20. While here, she will benefit from crisis stabilization, psychoeducation, group therapy, and discharge planning.  The recommendation at discharge is for her to continue to follow her discharge plan as arranged.  Madison Boyer Lynelle Smoke. 03/26/2020

## 2020-03-26 NOTE — [facility] Suicide Risk Assessment (Signed)
[facility] INPATIENT:  Family/Significant Other Suicide Prevention Education  Suicide Prevention Education:  Patient Refusal for Family/Significant Other Suicide Prevention Education: The patient Madison Boyer has refused to provide written consent for family/significant other to be provided Family/Significant Other Suicide Prevention Education during admission and/or prior to discharge.  Physician notified.  Aithan Farrelly T Rylan Bernard 03/26/2020, 11:04 AM

## 2020-03-26 NOTE — Progress Notes (Signed)
[facility] MD Progress Note  03/26/2020 2:29 PM Madison Boyer  MRN:  [id number] Subjective: Follow-up for this young woman with schizophrenia.  Patient seen chart reviewed.  Patient seems even worse today.  She would not get out of bed to attend treatment team.  Seems to be mostly petulance.  We came to see her in her room and she would not get out of bed and said she just did not want to attend treatment team.  She was childlike with a whining affect again.  Asked if she could be discharged.  When she was told that she did not appear to be ready she began crying that she was suicidal and wanted [place] to kill her.  Insight poor.  Behavior poor. Principal Problem: Schizoaffective disorder (HCC) Diagnosis: Principal Problem:   Schizoaffective disorder (HCC) Active Problems:   Acute psychosis (HCC)   Cannabis abuse  Total Time spent with patient: 30 minutes  Past Psychiatric History: Multiple hospitalizations over the last year since she has developed symptoms  Past Medical History: History reviewed. No pertinent past medical history.  Past Surgical History:  Procedure Laterality Date  . APPENDECTOMY     Family History:  Family History  Problem Relation Age of Onset  . Healthy Mother   . Healthy Father   . Healthy Maternal Grandmother   . Diabetes Maternal Grandfather   . Healthy Paternal Grandmother   . Healthy Paternal Grandfather    Family Psychiatric  History: See previous Social History:  Social History   Substance and Sexual Activity  Alcohol Use Yes   Comment: occassionally     Social History   Substance and Sexual Activity  Drug Use Yes  . Types: Marijuana    Social History   Socioeconomic History  . Marital status: Single    Spouse name: Not on file  . Number of children: Not on file  . Years of education: Not on file  . Highest education level: Not on file  Occupational History  . Not on file  Tobacco Use  . Smoking status: Never Smoker  . Smokeless  tobacco: Never Used  Vaping Use  . Vaping Use: Some days  Substance and Sexual Activity  . Alcohol use: Yes    Comment: occassionally  . Drug use: Yes    Types: Marijuana  . Sexual activity: Yes    Birth control/protection: Condom  Other Topics Concern  . Not on file  Social History Narrative   ** Merged History Encounter **       Social Determinants of Health   Financial Resource Strain:   . Difficulty of Paying Living Expenses:   Food Insecurity:   . Worried About Programme researcher, broadcasting/film/video in the Last Year:   . Barista in the Last Year:   Transportation Needs:   . Freight forwarder (Medical):   Marland Kitchen Lack of Transportation (Non-Medical):   Physical Activity:   . Days of Exercise per Week:   . Minutes of Exercise per Session:   Stress:   . Feeling of Stress :   Social Connections:   . Frequency of Communication with Friends and Family:   . Frequency of Social Gatherings with Friends and Family:   . Attends Religious Services:   . Active Member of Clubs or Organizations:   . Attends Banker Meetings:   Marland Kitchen Marital Status:    Additional Social History:  Sleep: Fair  Appetite:  Fair  Current Medications: Current Facility-Administered Medications  Medication Dose Route Frequency Provider Last Rate Last Admin  . acetaminophen (TYLENOL) tablet 650 mg  650 mg Oral Q6H PRN Gillermo Murdoch, NP   650 mg at 03/25/20 0753  . alum & mag hydroxide-simeth (MAALOX/MYLANTA) 200-200-20 MG/5ML suspension 30 mL  30 mL Oral Q4H PRN Gillermo Murdoch, NP      . benztropine (COGENTIN) tablet 0.5 mg  0.5 mg Oral BID Gillermo Murdoch, NP   0.5 mg at 03/26/20 0815  . haloperidol (HALDOL) tablet 5 mg  5 mg Oral BID Kayn Haymore T, MD   5 mg at 03/26/20 1694  . haloperidol decanoate (HALDOL DECANOATE) 100 MG/ML injection 100 mg  100 mg Intramuscular Q30 days Kerisha Goughnour T, MD   100 mg at 03/25/20 1257  . hydrOXYzine  (ATARAX/VISTARIL) tablet 25 mg  25 mg Oral TID PRN Gillermo Murdoch, NP   25 mg at 03/25/20 1705  . ibuprofen (ADVIL) tablet 400 mg  400 mg Oral Q6H PRN Gillermo Murdoch, NP      . magnesium hydroxide (MILK OF MAGNESIA) suspension 30 mL  30 mL Oral Daily PRN Gillermo Murdoch, NP   30 mL at 03/25/20 1645  . traZODone (DESYREL) tablet 50 mg  50 mg Oral QHS PRN Gillermo Murdoch, NP   50 mg at 03/25/20 2144    Lab Results:  Results for orders placed or performed during the hospital encounter of 03/25/20 (from the past 48 hour(s))  Lipid panel     Status: None   Collection Time: 03/25/20 12:19 PM  Result Value Ref Range   Cholesterol 160 0 - 169 mg/dL   Triglycerides 88 <503 mg/dL   HDL 56 >88 mg/dL   Total CHOL/HDL Ratio 2.9 RATIO   VLDL 18 0 - 40 mg/dL   LDL Cholesterol 86 0 - 99 mg/dL    Comment:        Total Cholesterol/HDL:CHD Risk Coronary Heart Disease Risk Table                     Men   Women  1/2 Average Risk   3.4   3.3  Average Risk       5.0   4.4  2 X Average Risk   9.6   7.1  3 X Average Risk  23.4   11.0        Use the calculated Patient Ratio above and the CHD Risk Table to determine the patient's CHD Risk.        ATP III CLASSIFICATION (LDL):  <100     mg/dL   Optimal  828-003  mg/dL   Near or Above                    Optimal  130-159  mg/dL   Borderline  491-791  mg/dL   High  >505     mg/dL   Very High Performed at [facility], [address] [place], [place] [address]     Blood Alcohol level:  Lab Results  Component Value Date   [facility] <10 03/24/2020   ETH <10 02/15/2020    Metabolic Disorder Labs: Lab Results  Component Value Date   HGBA1C 4.8 09/04/2018   MPG 91.06 09/04/2018   No results found for: PROLACTIN Lab Results  Component Value Date   CHOL 160 03/25/2020   TRIG 88 03/25/2020   HDL 56 03/25/2020   CHOLHDL 2.9  03/25/2020   VLDL 18 03/25/2020   LDLCALC 86 03/25/2020   LDLCALC 76 09/04/2018    Physical  Findings: AIMS:  , ,  ,  ,    CIWA:    COWS:     Musculoskeletal: Strength & Muscle Tone: within normal limits Gait & Station: normal Patient leans: N/A  Psychiatric Specialty Exam: Physical Exam  Nursing note and vitals reviewed. Constitutional: She appears well-developed.  HENT:  Head: Normocephalic and atraumatic.  Eyes: Pupils are equal, round, and reactive to light. Conjunctivae are normal.  Cardiovascular: Normal heart sounds.  Respiratory: Effort normal.  GI: Soft.  Musculoskeletal:        General: Normal range of motion.     Cervical back: Normal range of motion.  Neurological: She is alert.  Skin: Skin is warm and dry.  Psychiatric: Her affect is inappropriate. Her speech is delayed. She is slowed. Thought content is paranoid. Cognition and memory are impaired. She expresses inappropriate judgment. She expresses suicidal ideation. She expresses no homicidal ideation. She expresses no suicidal plans. She is inattentive.    Review of Systems  Constitutional: Negative.   HENT: Negative.   Eyes: Negative.   Respiratory: Negative.   Cardiovascular: Negative.   Gastrointestinal: Negative.   Musculoskeletal: Negative.   Skin: Negative.   Neurological: Negative.   Psychiatric/Behavioral: Positive for confusion, dysphoric mood and suicidal ideas.    Blood pressure 126/81, pulse 93, temperature 98.3 F (36.8 C), temperature source Oral, resp. rate 18, height 5\' 2"  (1.575 m), weight 81.6 kg, last menstrual period 02/25/2020, SpO2 98 %.Body mass index is 32.9 kg/m.  General Appearance: Casual  Eye Contact:  Minimal  Speech:  Garbled  Volume:  Decreased  Mood:  Anxious and Dysphoric  Affect:  Inappropriate  Thought Process:  Disorganized  Orientation:  Full (Time, Place, and Person)  Thought Content:  Illogical and Tangential  Suicidal Thoughts:  Yes.  without intent/plan  Homicidal Thoughts:  No  Memory:  Immediate;   Fair Recent;   Poor Remote;   Poor  Judgement:   Impaired  Insight:  Lacking  Psychomotor Activity:  Decreased  Concentration:  Concentration: Fair  Recall:  [employer] of Knowledge:  Fair  Language:  Fair  Akathisia:  No  Handed:  Right  AIMS (if indicated):     Assets:  Desire for Improvement Housing Physical Health Resilience Social Support  ADL's:  Impaired  Cognition:  Impaired,  Mild  Sleep:  Number of Hours: 3     Treatment Plan Summary: Daily contact with patient to assess and evaluate symptoms and progress in treatment, Medication management and Plan 19 year old with schizophrenia who has had trouble maintaining stability outside of the hospital.  Poor insight.  Impaired affect and cognition.  She is currently on haloperidol consistent with what she was taking last time she was here.  No change today to medication.  Strongly encourage patient to get out of bed and be more participatory.  Told her I did not think that she was ready for discharge and that we should invest the time to make sure she is better especially given that she has had so many repeat hospitalizations.  Alethia Berthold, MD 03/26/2020, 2:29 PM

## 2020-03-26 NOTE — Progress Notes (Signed)
Pt is alert and oriented to person, place, but not to time or situation. Pt's affect is blunted, mood is depressed, pt reports feeling depressed, states yes, but is unable to rate it on a scale from 0-10, 10 being worst, denies anxiety, denies suicidal and homicidal ideation, denies hallucinations. Pt noted to be thought blocking and internally preoccupied and distracted often. Pt is calm, uncooperative, requires a great deal of staff's encouragement to leave her bed, and room and come out for medications, eventually came out and took medications, refused breakfast, refused to attend treatment team. Pt at times is selectively mute and will not respond to questions.

## 2020-03-26 NOTE — Tx Team (Addendum)
Interdisciplinary Treatment and Diagnostic Plan Update  03/26/2020 Time of Session: 9am Madison Boyer MRN: [id number]  Principal Diagnosis: Schizoaffective disorder [facility])  Secondary Diagnoses: Principal Problem:   Schizoaffective disorder ([facility]) Active Problems:   Acute psychosis ([place])   Cannabis abuse   Current Medications:  Current Facility-Administered Medications  Medication Dose Route Frequency Provider Last Rate Last Admin  . acetaminophen (TYLENOL) tablet 650 mg  650 mg Oral Q6H PRN Caroline Sauger, NP   650 mg at 03/25/20 0753  . alum & mag hydroxide-simeth (MAALOX/MYLANTA) 200-200-20 MG/5ML suspension 30 mL  30 mL Oral Q4H PRN Caroline Sauger, NP      . benztropine (COGENTIN) tablet 0.5 mg  0.5 mg Oral BID Caroline Sauger, NP   0.5 mg at 03/26/20 0815  . haloperidol (HALDOL) tablet 5 mg  5 mg Oral BID Clapacs, John T, MD   5 mg at 03/26/20 1761  . haloperidol decanoate (HALDOL DECANOATE) 100 MG/ML injection 100 mg  100 mg Intramuscular Q30 days Clapacs, John T, MD   100 mg at 03/25/20 1257  . hydrOXYzine (ATARAX/VISTARIL) tablet 25 mg  25 mg Oral TID PRN Caroline Sauger, NP   25 mg at 03/25/20 1705  . ibuprofen (ADVIL) tablet 400 mg  400 mg Oral Q6H PRN Caroline Sauger, NP      . magnesium hydroxide (MILK OF MAGNESIA) suspension 30 mL  30 mL Oral Daily PRN Caroline Sauger, NP   30 mL at 03/25/20 1645  . traZODone (DESYREL) tablet 50 mg  50 mg Oral QHS PRN Caroline Sauger, NP   50 mg at 03/25/20 2144   PTA Medications: Medications Prior to Admission  Medication Sig Dispense Refill Last Dose  . benztropine (COGENTIN) 0.5 MG tablet Take 1 tablet (0.5 mg total) by mouth 2 (two) times daily. 60 tablet 1   . haloperidol (HALDOL) 5 MG tablet Take 1 tablet (5 mg total) by mouth 2 (two) times daily. 20 tablet 0   . haloperidol decanoate (HALDOL DECANOATE) 100 MG/ML injection Inject 1 mL (100 mg total) into the muscle every 30 (thirty) days.  Next dose due on March 25, 2020 1 mL 1   . ibuprofen (MOTRIN IB) 200 MG tablet Take 2 tablets (400 mg total) by mouth every 6 (six) hours as needed. (Patient not taking: Reported on 03/24/2020) 30 tablet 0   . traZODone (DESYREL) 50 MG tablet Take 1 tablet (50 mg total) by mouth at bedtime as needed for sleep. 30 tablet 1     Patient Stressors: Medication change or noncompliance Substance abuse  Patient Strengths: Ability for insight Motivation for treatment/growth  Treatment Modalities: Medication Management, Group therapy, Case management,  1 to 1 session with clinician, Psychoeducation, Recreational therapy.   Physician Treatment Plan for Primary Diagnosis: Schizoaffective disorder ([place]) Long Term Goal(s): Improvement in symptoms so as ready for discharge Improvement in symptoms so as ready for discharge   Short Term Goals: Ability to verbalize feelings will improve Ability to demonstrate self-control will improve Ability to identify and develop effective coping behaviors will improve Compliance with prescribed medications will improve  Medication Management: Evaluate patient's response, side effects, and tolerance of medication regimen.  Therapeutic Interventions: 1 to 1 sessions, Unit Group sessions and Medication administration.  Evaluation of Outcomes: Not Met  Physician Treatment Plan for Secondary Diagnosis: Principal Problem:   Schizoaffective disorder ([place]) Active Problems:   Acute psychosis ([place])   Cannabis abuse  Long Term Goal(s): Improvement in symptoms so as ready for discharge Improvement in  symptoms so as ready for discharge   Short Term Goals: Ability to verbalize feelings will improve Ability to demonstrate self-control will improve Ability to identify and develop effective coping behaviors will improve Compliance with prescribed medications will improve     Medication Management: Evaluate patient's response, side effects, and tolerance of medication  regimen.  Therapeutic Interventions: 1 to 1 sessions, Unit Group sessions and Medication administration.  Evaluation of Outcomes: Not Met   RN Treatment Plan for Primary Diagnosis: Schizoaffective disorder ([place]) Long Term Goal(s): Knowledge of disease and therapeutic regimen to maintain health will improve  Short Term Goals: Ability to identify and develop effective coping behaviors will improve and Compliance with prescribed medications will improve  Medication Management: RN will administer medications as ordered by provider, will assess and evaluate patient's response and provide education to patient for prescribed medication. RN will report any adverse and/or side effects to prescribing provider.  Therapeutic Interventions: 1 on 1 counseling sessions, Psychoeducation, Medication administration, Evaluate responses to treatment, Monitor vital signs and CBGs as ordered, Perform/monitor CIWA, COWS, AIMS and Fall Risk screenings as ordered, Perform wound care treatments as ordered.  Evaluation of Outcomes: Not Met   LCSW Treatment Plan for Primary Diagnosis: Schizoaffective disorder ([place]) Long Term Goal(s): Safe transition to appropriate next level of care at discharge, Engage patient in therapeutic group addressing interpersonal concerns.  Short Term Goals: Engage patient in aftercare planning with referrals and resources  Therapeutic Interventions: Assess for all discharge needs, 1 to 1 time with Social worker, Explore available resources and support systems, Assess for adequacy in community support network, Educate family and significant other(s) on suicide prevention, Complete Psychosocial Assessment, Interpersonal group therapy.  Evaluation of Outcomes: Not Met   Progress in Treatment: Attending groups: Yes. Participating in groups: No. Taking medication as prescribed: Yes. Toleration medication: Yes. Family/Significant other contact made: Yes, individual(s) contacted:  with pt,  declined family contact Patient understands diagnosis: Yes. Discussing patient identified problems/goals with staff: Yes. Medical problems stabilized or resolved: No. Denies suicidal/homicidal ideation: Yes. Issues/concerns per patient self-inventory: No. Other: NA  New problem(s) identified: No, Describe:  None reported  New Short Term/Long Term Goal(s):   Patient Goals:  Pt given opportunity to participate in team meeting but declined. No goal identified at this time.  Discharge Plan or Barriers: Pt lives with parents and sees PCP for medication management.  Reason for Continuation of Hospitalization: Medication stabilization  Estimated Length of Stay:1-7 days  Recreational Therapy: Patient: N/A Patient Goal: Patient will engage in groups without prompting or encouragement from LRT x3 group sessions within 5 recreation therapy group sessions.  Attendees: Patient:Did not attend meeting 03/26/2020 11:55 AM  Physician: Alethia Berthold 03/26/2020 11:55 AM  Nursing: Collier Bullock 03/26/2020 11:55 AM  RN Care Manager: 03/26/2020 11:55 AM  Social Worker: Assunta Curtis Sanjuana Kava 03/26/2020 11:55 AM  Recreational Therapist: Roanna Epley 03/26/2020 11:55 AM  Other:  03/26/2020 11:55 AM  Other:  03/26/2020 11:55 AM  Other: 03/26/2020 11:55 AM    Scribe for Treatment Team: Yvette Rack, LCSW 03/26/2020 11:55 AM

## 2020-03-26 NOTE — [facility] Group Notes (Signed)
LCSW Group Therapy Note  03/26/2020 3:08 PM  Type of Therapy/Topic:  Group Therapy:  Balance in Life  Participation Level:  Did Not Attend  Description of Group:    This group will address the concept of balance and how it feels and looks when one is unbalanced. Patients will be encouraged to process areas in their lives that are out of balance and identify reasons for remaining unbalanced. Facilitators will guide patients in utilizing problem-solving interventions to address and correct the stressor making their life unbalanced. Understanding and applying boundaries will be explored and addressed for obtaining and maintaining a balanced life. Patients will be encouraged to explore ways to assertively make their unbalanced needs known to significant others in their lives, using other group members and facilitator for support and feedback.  Therapeutic Goals: 1. Patient will identify two or more emotions or situations they have that consume much of in their lives. 2. Patient will identify signs/triggers that life has become out of balance:  3. Patient will identify two ways to set boundaries in order to achieve balance in their lives:  4. Patient will demonstrate ability to communicate their needs through discussion and/or role plays  Summary of Patient Progress: X  Therapeutic Modalities:   Cognitive Behavioral Therapy Solution-Focused Therapy Assertiveness Training  Penni Homans MSW, LCSW 03/26/2020 3:08 PM

## 2020-03-26 NOTE — Progress Notes (Signed)
Recreation Therapy Notes  Date: 03/26/2020  Time: 9:30 am   Location: Room 21   Behavioral response: N/A   Intervention Topic: Animal Assisted therapy    Discussion/Intervention: Patient did not attend group.   Clinical Observations/Feedback:  Patient did not attend group.   Julliette Frentz LRT/CTRS        Loranda Mastel 03/26/2020 12:57 PM

## 2020-03-27 MED ORDER — DOCUSATE SODIUM 100 MG PO CAPS
100.0000 mg | ORAL_CAPSULE | Freq: Two times a day (BID) | ORAL | Status: DC
  Administered 2020-03-27 – 2020-03-31 (×8): 100 mg via ORAL
  Filled 2020-03-27 (×8): qty 1

## 2020-03-27 NOTE — Progress Notes (Signed)
[facility] MD Progress Note  03/27/2020 12:59 PM Madison Boyer  MRN:  [id number] Subjective: Follow-up for this young woman with schizoaffective disorder.  Patient presents as crying and needy today evidently she is up out of bed hygiene looks a little bit better and she is going to meals appropriately.  Patient inquired why she had not received her Haldol Decanoate shot.  I checked the [facility] and pointed out that she got it 2 days ago.  Patient is still disorganized and talking about having sold her soul and being frightened.  Gets tearful easily.  Not able to carry on a lucid conversation. Principal Problem: Schizoaffective disorder (HCC) Diagnosis: Principal Problem:   Schizoaffective disorder (HCC) Active Problems:   Acute psychosis (HCC)   Cannabis abuse  Total Time spent with patient: 30 minutes  Past Psychiatric History: recurrent psychosis  Past Medical History: History reviewed. No pertinent past medical history.  Past Surgical History:  Procedure Laterality Date   APPENDECTOMY     Family History:  Family History  Problem Relation Age of Onset   Healthy Mother    Healthy Father    Healthy Maternal Grandmother    Diabetes Maternal Grandfather    Healthy Paternal Grandmother    Healthy Paternal Grandfather    Family Psychiatric  History: see previous Social History:  Social History   Substance and Sexual Activity  Alcohol Use Yes   Comment: occassionally     Social History   Substance and Sexual Activity  Drug Use Yes   Types: Marijuana    Social History   Socioeconomic History   Marital status: Single    Spouse name: Not on file   Number of children: Not on file   Years of education: Not on file   Highest education level: Not on file  Occupational History   Not on file  Tobacco Use   Smoking status: Never Smoker   Smokeless tobacco: Never Used  Vaping Use   Vaping Use: Some days  Substance and Sexual Activity   Alcohol use: Yes     Comment: occassionally   Drug use: Yes    Types: Marijuana   Sexual activity: Yes    Birth control/protection: Condom  Other Topics Concern   Not on file  Social History Narrative   ** Merged History Encounter **       Social Determinants of Health   Financial Resource Strain:    Difficulty of Paying Living Expenses:   Food Insecurity:    Worried About Programme researcher, broadcasting/film/video in the Last Year:    Barista in the Last Year:   Transportation Needs:    Freight forwarder (Medical):    Lack of Transportation (Non-Medical):   Physical Activity:    Days of Exercise per Week:    Minutes of Exercise per Session:   Stress:    Feeling of Stress :   Social Connections:    Frequency of Communication with Friends and Family:    Frequency of Social Gatherings with Friends and Family:    Attends Religious Services:    Active Member of Clubs or Organizations:    Attends Banker Meetings:    Marital Status:    Additional Social History:                         Sleep: Fair  Appetite:  Fair  Current Medications: Current Facility-Administered Medications  Medication Dose Route Frequency Provider Last Rate  Last Admin   acetaminophen (TYLENOL) tablet 650 mg  650 mg Oral Q6H PRN Caroline Sauger, NP   650 mg at 03/27/20 0802   alum & mag hydroxide-simeth (MAALOX/MYLANTA) 200-200-20 MG/5ML suspension 30 mL  30 mL Oral Q4H PRN Caroline Sauger, NP       benztropine (COGENTIN) tablet 0.5 mg  0.5 mg Oral BID Caroline Sauger, NP   0.5 mg at 03/27/20 5188   docusate sodium (COLACE) capsule 100 mg  100 mg Oral BID Ashar Lewinski, Madie Reno, MD       haloperidol (HALDOL) tablet 5 mg  5 mg Oral BID Krishan Mcbreen T, MD   5 mg at 03/27/20 0802   haloperidol decanoate (HALDOL DECANOATE) 100 MG/ML injection 100 mg  100 mg Intramuscular Q30 days Deaisa Merida T, MD   100 mg at 03/25/20 1257   hydrOXYzine (ATARAX/VISTARIL) tablet 25 mg  25 mg Oral  TID PRN Caroline Sauger, NP   25 mg at 03/27/20 0802   ibuprofen (ADVIL) tablet 400 mg  400 mg Oral Q6H PRN Caroline Sauger, NP       magnesium hydroxide (MILK OF MAGNESIA) suspension 30 mL  30 mL Oral Daily PRN Caroline Sauger, NP   30 mL at 03/26/20 2125   traZODone (DESYREL) tablet 50 mg  50 mg Oral QHS PRN Caroline Sauger, NP   50 mg at 03/26/20 2125    Lab Results: No results found for this or any previous visit (from the past 38 hour(s)).  Blood Alcohol level:  Lab Results  Component Value Date   ETH <10 03/24/2020   ETH <10 41/66/0630    Metabolic Disorder Labs: Lab Results  Component Value Date   HGBA1C 4.8 09/04/2018   MPG 91.06 09/04/2018   No results found for: PROLACTIN Lab Results  Component Value Date   CHOL 160 03/25/2020   TRIG 88 03/25/2020   HDL 56 03/25/2020   CHOLHDL 2.9 03/25/2020   VLDL 18 03/25/2020   LDLCALC 86 03/25/2020   LDLCALC 76 09/04/2018    Physical Findings: AIMS:  , ,  ,  ,    CIWA:    COWS:     Musculoskeletal: Strength & Muscle Tone: within normal limits Gait & Station: normal Patient leans: N/A  Psychiatric Specialty Exam: Physical Exam  Nursing note and vitals reviewed. Constitutional: She appears well-developed.  HENT:  Head: Normocephalic and atraumatic.  Eyes: Pupils are equal, round, and reactive to light. Conjunctivae are normal.  Cardiovascular: Normal heart sounds.  Respiratory: Effort normal.  GI: Soft.  Musculoskeletal:        General: Normal range of motion.     Cervical back: Normal range of motion.  Neurological: She is alert.  Skin: Skin is warm and dry.  Psychiatric: Her mood appears anxious. Her affect is inappropriate. Her speech is delayed. She is withdrawn. Thought content is paranoid. She expresses inappropriate judgment. She is inattentive.    Review of Systems  Constitutional: Negative.   HENT: Negative.   Eyes: Negative.   Respiratory: Negative.   Cardiovascular:  Negative.   Gastrointestinal: Negative.   Musculoskeletal: Negative.   Skin: Negative.   Neurological: Negative.   Psychiatric/Behavioral: Positive for dysphoric mood and hallucinations. The patient is nervous/anxious.     Blood pressure 127/75, pulse 76, temperature 97.7 F (36.5 C), temperature source Oral, resp. rate 18, height 5\' 2"  (1.575 m), weight 81.6 kg, SpO2 100 %.Body mass index is 32.9 kg/m.  General Appearance: Casual  Eye Contact:  Minimal  Speech:  Garbled  Volume:  Decreased  Mood:  Anxious and Dysphoric  Affect:  Inappropriate  Thought Process:  Disorganized  Orientation:  Full (Time, Place, and Person)  Thought Content:  Illogical, Hallucinations: Auditory and Paranoid Ideation  Suicidal Thoughts:  Yes.  without intent/plan  Homicidal Thoughts:  No  Memory:  Immediate;   Fair Recent;   Fair Remote;   Fair  Judgement:  Poor  Insight:  Lacking  Psychomotor Activity:  Decreased and Restlessness  Concentration:  Concentration: Poor  Recall:  Poor  Fund of Knowledge:  Poor  Language:  Poor  Akathisia:  No  Handed:  Right  AIMS (if indicated):     Assets:  Desire for Improvement Housing Physical Health  ADL's:  Impaired  Cognition:  Impaired,  Mild  Sleep:  Number of Hours: 5     Treatment Plan Summary: Daily contact with patient to assess and evaluate symptoms and progress in treatment, Medication management and Plan continue antipsychotic medication. Urge group participation. Continue assessing mood and thoughts and behavior  Mordecai Rasmussen, MD 03/27/2020, 12:59 PM

## 2020-03-27 NOTE — [facility] Group Notes (Signed)
Overcoming Obstacles  03/27/2020 1PM  Type of Therapy and Topic:  Group Therapy:  Overcoming Obstacles  Participation Level:  None    Description of Group:    In this group patients will be encouraged to explore what they see as obstacles to their own wellness and recovery. They will be guided to discuss their thoughts, feelings, and behaviors related to these obstacles. The group will process together ways to cope with barriers, with attention given to specific choices patients can make. Each patient will be challenged to identify changes they are motivated to make in order to overcome their obstacles. This group will be process-oriented, with patients participating in exploration of their own experiences as well as giving and receiving support and challenge from other group members.   Therapeutic Goals: 1. Patient will identify personal and current obstacles as they relate to admission. 2. Patient will identify barriers that currently interfere with their wellness or overcoming obstacles.  3. Patient will identify feelings, thought process and behaviors related to these barriers. 4. Patient will identify two changes they are willing to make to overcome these obstacles:      Summary of Patient Progress Patient attended group session but no participation during session. Patient sat quietly and made drawings while in session. No interaction with group members. Patient respected boundaries.    Therapeutic Modalities:   Cognitive Behavioral Therapy Solution Focused Therapy Motivational Interviewing Relapse Prevention Therapy    Lowella Dandy, MSW, LCSW 03/27/2020 2:04 PM

## 2020-03-27 NOTE — Progress Notes (Signed)
Recreation Therapy Notes  Date: 03/27/2020  Time: 9:30 am   Location: Craft room    Behavioral response: N/A   Intervention Topic: Stress Management   Discussion/Intervention: Patient did not attend group.   Clinical Observations/Feedback:  Patient did not attend group.   Elika Godar LRT/CTRS          Medardo Hassing 03/27/2020 11:44 AM

## 2020-03-27 NOTE — Progress Notes (Signed)
Patient alert and oriented x 4, affect is blunted thoughts are disorganized and incoherent , she was noted restless and pacing the unit frequently, nursijng staff offered her emotional support and encouragement, she was receptive to staff and was less anxious after a while . Patient was complaint with medication regimen and she attended evening wrap up group without disruption. 15 minutes safety checks maintained will continue to monitor.

## 2020-03-27 NOTE — Plan of Care (Signed)
D: Pt alert and oriented x 4. Pt rates depression 0/10 (pt reports 10/10 verbally) and anxiety 10/10 (verbally reported).Pt goal: "play outside." Pt reports energy level as low and concentration as being good. Pt reports sleep last night as being good. Pt reports experiencing arm pain 7/10 at injection site from Wednesday. Pt denies experiencing any SI/HI, or AVH at this time.   Pt laided down on floor multiple times today, at the end of the blue hall and in front of the MD's office.   A: Scheduled medications administered to pt, per MD orders. Support and encouragement provided. Frequent verbal contact made. Routine safety checks conducted q15 minutes.   R: No adverse drug reactions noted. Pt verbally contracts for safety at this time. Pt complaint with medications and treatment plan. Pt interacts well with others on the unit. Pt remains safe at this time. Will continue to monitor.   Problem: Education: Goal: Knowledge of [facility] Education information/materials will improve Outcome: Not Progressing Goal: Emotional status will improve Outcome: Not Progressing Goal: Mental status will improve Outcome: Not Progressing Goal: Verbalization of understanding the information provided will improve Outcome: Not Progressing   Problem: Activity: Goal: Will verbalize the importance of balancing activity with adequate rest periods Outcome: Not Progressing   Problem: Safety: Goal: Ability to redirect hostility and anger into socially appropriate behaviors will improve Outcome: Not Progressing

## 2020-03-27 NOTE — Progress Notes (Signed)
Recreation Therapy Notes  INPATIENT RECREATION THERAPY ASSESSMENT  Patient Details Name: Madison Boyer MRN: [id number] DOB: 13-Mar-2001 Today's Date: 03/27/2020       Information Obtained From: Patient (Patient refused assessment)  Able to Participate in Assessment/Interview:    Patient Presentation:    Reason for Admission (Per Patient):    Patient Stressors:    Coping Skills:      Leisure Interests (2+):     Frequency of Recreation/Participation:    Awareness of Community Resources:     [employer]:     Current Use:    If no, Barriers?:    Expressed Interest in [employer] Information:    [place] of Residence:     Patient Main Form of Transportation:    Patient Strengths:     Patient Identified Areas of Improvement:     Patient Goal for Hospitalization:     Current SI (including self-harm):     Current HI:     Current AVH:    Staff Intervention Plan:    Consent to Intern Participation:    Melaina Howerton 03/27/2020, 2:48 PM

## 2020-03-28 MED ORDER — VORTIOXETINE HBR 5 MG PO TABS
10.0000 mg | ORAL_TABLET | Freq: Every day | ORAL | Status: DC
  Administered 2020-03-28 – 2020-03-29 (×2): 10 mg via ORAL
  Filled 2020-03-28 (×3): qty 2

## 2020-03-28 NOTE — Progress Notes (Signed)
Patient alert and oriented x 3 affect is blunted thoughts are disorganized and incoherent , she was restless and pacing the unit frequently, very intrusive and demanding, she was often redirected, and nursijng staff offered her emotional support and encouragement, she was receptive to staff.  Patient was complaint with medication regimen and she attended evening wrap up group without disruption. 15 minutes safety checks maintained will continue to monitor.

## 2020-03-28 NOTE — [facility] Group Notes (Signed)
[facility] Group Notes:  (Nursing/MHT/Case Management/Adjunct)  Date:  03/28/2020  Time:  10:35 PM  Type of Therapy:  Group Therapy  Participation Level:  Did Not Attend  Participation Quality:  Patient slp.  Summary of Progress/Problems:  Madison Boyer 03/28/2020, 10:35 PM

## 2020-03-28 NOTE — [facility] Group Notes (Signed)
LCSW Group Therapy Note  03/28/2020   1:00-1:40PM   Type of Therapy and Topic:  Group Therapy: Anger Cues and Responses  Participation Level:  None   Description of Group:   In this group, patients learned how to recognize the physical, cognitive, emotional, and behavioral responses they have to anger-provoking situations.  They identified a recent time they became angry and how they reacted.  They analyzed how their reaction was possibly beneficial and how it was possibly unhelpful.  The group discussed a variety of healthier coping skills that could help with such a situation in the future.  Focus was placed on how helpful it is to recognize the underlying emotions to our anger, because working on those can lead to a more permanent solution as well as our ability to focus on the important rather than the urgent.  Therapeutic Goals: 1. Patients will remember their last incident of anger and how they felt emotionally and physically, what their thoughts were at the time, and how they behaved. 2. Patients will identify how their behavior at that time worked for them, as well as how it worked against them. 3. Patients will explore possible new behaviors to use in future anger situations. 4. Patients will learn that anger itself is normal and cannot be eliminated, and that healthier reactions can assist with resolving conflict rather than worsening situations.  Summary of Patient Progress:  The patient shared that her most recent time of anger was before coming to the hospital when her mom kicked her out of the house. Patient put her head down for a few minutes then looked towards the ceiling muttering to herself before leaving group abruptly.   Therapeutic Modalities:   Cognitive Behavioral Therapy  Shellia Cleverly

## 2020-03-28 NOTE — Progress Notes (Signed)
[facility] MD Progress Note  03/28/2020 1:34 PM Madison Boyer  MRN:  [id number] Subjective:  Patient silent staring strangely answers questions with nods says she is depressed also  Principal Problem: Schizoaffective disorder ([place]) Diagnosis: Principal Problem:   Schizoaffective disorder ([place]) Active Problems:   Acute psychosis ([place])   Cannabis abuse  Total Time spent with patient:  25 minutes   Past Psychiatric History: already noted   Past Medical History: History reviewed. No pertinent past medical history.  Past Surgical History:  Procedure Laterality Date  . APPENDECTOMY     Family History:  Family History  Problem Relation Age of Onset  . Healthy Mother   . Healthy Father   . Healthy Maternal Grandmother   . Diabetes Maternal Grandfather   . Healthy Paternal Grandmother   . Healthy Paternal Grandfather    Family Psychiatric  History: already reviewed   Social History:  Social History   Substance and Sexual Activity  Alcohol Use Yes   Comment: occassionally     Social History   Substance and Sexual Activity  Drug Use Yes  . Types: Marijuana    Social History   Socioeconomic History  . Marital status: Single    Spouse name: Not on file  . Number of children: Not on file  . Years of education: Not on file  . Highest education level: Not on file  Occupational History  . Not on file  Tobacco Use  . Smoking status: Never Smoker  . Smokeless tobacco: Never Used  Vaping Use  . Vaping Use: Some days  Substance and Sexual Activity  . Alcohol use: Yes    Comment: occassionally  . Drug use: Yes    Types: Marijuana  . Sexual activity: Yes    Birth control/protection: Condom  Other Topics Concern  . Not on file  Social History Narrative   ** Merged History Encounter **       Social Determinants of Health   Financial Resource Strain:   . Difficulty of Paying Living Expenses:   Food Insecurity:   . Worried About Charity fundraiser in the Last  Year:   . Arboriculturist in the Last Year:   Transportation Needs:   . Film/video editor (Medical):   Marland Kitchen Lack of Transportation (Non-Medical):   Physical Activity:   . Days of Exercise per Week:   . Minutes of Exercise per Session:   Stress:   . Feeling of Stress :   Social Connections:   . Frequency of Communication with Friends and Family:   . Frequency of Social Gatherings with Friends and Family:   . Attends Religious Services:   . Active Member of Clubs or Organizations:   . Attends Archivist Meetings:   Marland Kitchen Marital Status:    Additional Social History:  None   She remains strange, odd, silent bizarre Flatly sits on her bed ---barely nods to questions does admit to depression Unclear safety margin, not clear if she would harm self if discharged  No other new side effects or medical problems                         Sleep: waking up in early am   Appetite:   Fair  Current Medications: Current Facility-Administered Medications  Medication Dose Route Frequency Provider Last Rate Last Admin  . acetaminophen (TYLENOL) tablet 650 mg  650 mg Oral Q6H PRN Caroline Sauger, NP   [phone]  mg at 03/27/20 0802  . alum & mag hydroxide-simeth (MAALOX/MYLANTA) 200-200-20 MG/5ML suspension 30 mL  30 mL Oral Q4H PRN Gillermo Murdoch, NP      . benztropine (COGENTIN) tablet 0.5 mg  0.5 mg Oral BID Gillermo Murdoch, NP   0.5 mg at 03/28/20 0752  . docusate sodium (COLACE) capsule 100 mg  100 mg Oral BID Clapacs, John T, MD   100 mg at 03/28/20 7619  . haloperidol (HALDOL) tablet 5 mg  5 mg Oral BID Clapacs, John T, MD   5 mg at 03/28/20 5093  . haloperidol decanoate (HALDOL DECANOATE) 100 MG/ML injection 100 mg  100 mg Intramuscular Q30 days Clapacs, John T, MD   100 mg at 03/25/20 1257  . hydrOXYzine (ATARAX/VISTARIL) tablet 25 mg  25 mg Oral TID PRN Gillermo Murdoch, NP   25 mg at 03/27/20 2100  . ibuprofen (ADVIL) tablet 400 mg  400 mg Oral Q6H PRN  Gillermo Murdoch, NP      . magnesium hydroxide (MILK OF MAGNESIA) suspension 30 mL  30 mL Oral Daily PRN Gillermo Murdoch, NP   30 mL at 03/27/20 2101  . traZODone (DESYREL) tablet 50 mg  50 mg Oral QHS PRN Gillermo Murdoch, NP   50 mg at 03/27/20 2100  . vortioxetine HBr (TRINTELLIX) tablet 10 mg  10 mg Oral Daily Roselind Messier, MD        Lab Results: No results found for this or any previous visit (from the past 48 hour(s)).  Blood Alcohol level:  Lab Results  Component Value Date   ETH <10 03/24/2020   ETH <10 02/15/2020    Metabolic Disorder Labs: Lab Results  Component Value Date   HGBA1C 4.8 09/04/2018   MPG 91.06 09/04/2018   No results found for: PROLACTIN Lab Results  Component Value Date   CHOL 160 03/25/2020   TRIG 88 03/25/2020   HDL 56 03/25/2020   CHOLHDL 2.9 03/25/2020   VLDL 18 03/25/2020   LDLCALC 86 03/25/2020   LDLCALC 76 09/04/2018    Physical Findings: AIMS:  , ,  ,  ,   pending  CIWA:    COWS:     Musculoskeletal: Strength & Muscle Tone: no new change  Gait & Station: slow --- Patient leans: n/a   Psychiatric Specialty Exam: Physical Exam  Review of Systems  Blood pressure 121/81, pulse 91, temperature 98.4 F (36.9 C), temperature source Oral, resp. rate 18, height 5\' 2"  (1.575 m), weight 81.6 kg, SpO2 100 %.Body mass index is 32.9 kg/m.    MENTAL status  Appearance ---better grooming  Rapport --poor --silent staring, bizarre strange possibly internally distracted not answer verbally  Oriented to person --unclear of others Speech --electively mute  Concentration and attention diminished Consciousness not fluctuant or clouded but has glazed eye look  Mood depressed affect constricted Judgment insight reliability poor  Memory --not clear she is silent SI and HI not known  Movements --no dystonia, shakes and tremors or EPS or akithisia  Intelligence and fund of knowledge--need --not clear  -- Thought process and content ---paranoid fearful avoidant  Internal distraction reportedly disorganized and illogical  ADL--s needs encouragement Sleep --  Wakes up early am  Cognition --not clear  Assets --family supportive  Treatment Plan Summary:  Mixed ethnic female with relapse and or acute S/A symptoms requiring admission  Appears depressed as well    Trintellix also added and haldol dosing mostly kept at night to avoid daytime glazed look and flatness   Am follow up   Minimal --speech and dialogue at this time      Roselind Messier, MD 03/28/2020, 1:34 PM

## 2020-03-28 NOTE — Plan of Care (Signed)
D: Pt alert and oriented x 4. Pt rates depression 0/10, hopelessness 0/10, and anxiety 10/10 (pt denies any anxiety verbally face - to - face).Pt goal: "outside." Pt reports energy level as low and concentration as being good. Pt reports sleep last night as being good. Pt did receive medications for sleep and did find it helpful. Pt denies experiencing any pain at this time. Pt denies experiencing any SI/HI, or AVH at this time.  Pt has stayed in room for majority of the day. Pt did come out and paced the halls a few times. Pt went outside and interacted with others during recreation time. Pt was upset during lunch time when she asked for a hamburger. It was explained that she did not order a hamburger and that this was what she ordered. Pt then refused to eat lunch and went to bed. Pt did however eat dinner.  A: Scheduled medications administered to pt, per MD orders. Support and encouragement provided. Frequent verbal contact made. Routine safety checks conducted q15 minutes.   R: No adverse drug reactions noted. Pt verbally contracts for safety at this time. Pt complaint with medications and treatment plan. Pt interacts well with others on the unit. Pt remains safe at this time. Will continue to monitor.   Problem: Education: Goal: Emotional status will improve Outcome: Not Progressing Goal: Mental status will improve Outcome: Not Progressing Goal: Verbalization of understanding the information provided will improve Outcome: Not Progressing   Problem: Activity: Goal: Will verbalize the importance of balancing activity with adequate rest periods Outcome: Not Progressing

## 2020-03-29 MED ORDER — HALOPERIDOL 5 MG PO TABS: 7.5000 mg | ORAL_TABLET | Freq: Every day | ORAL | Status: DC

## 2020-03-29 MED ORDER — VORTIOXETINE HBR 5 MG PO TABS
20.0000 mg | ORAL_TABLET | Freq: Every day | ORAL | Status: DC
  Administered 2020-03-30: 20 mg via ORAL
  Filled 2020-03-29 (×2): qty 4

## 2020-03-29 NOTE — Progress Notes (Signed)
Patient has been isolative to room and to self. Up once during the night. Pleasant and cooperative. Not voicing any complaints. Appears internally preoccupied

## 2020-03-29 NOTE — Plan of Care (Signed)
D: Pt alert and oriented x 4. Pt verbally rates anxiety 10/10, prn meds given. Pt goal: "go outside." Pt verbalizes that they slept well. Pt denies experiencing any pain at this time. Pt denies experiencing any SI/HI, or AVH at this time.   A: Scheduled medications administered to pt, per MD orders. Support and encouragement provided. Frequent verbal contact made. Routine safety checks conducted q15 minutes.   R: No adverse drug reactions noted. Pt verbally contracts for safety at this time. Pt complaint with medications and treatment plan. Pt interacts well with others on the unit. Pt remains safe at this time. Will continue to monitor.   Problem: Education: Goal: Knowledge of [place] [facility] Education information/materials will improve Outcome: Not Progressing Goal: Emotional status will improve Outcome: Not Progressing Goal: Mental status will improve Outcome: Not Progressing Goal: Verbalization of understanding the information provided will improve Outcome: Not Progressing   Problem: Safety: Goal: Ability to redirect hostility and anger into socially appropriate behaviors will improve Outcome: Not Progressing

## 2020-03-29 NOTE — Plan of Care (Signed)
  Problem: Education: Goal: Knowledge of [place] [facility] Education information/materials will improve Outcome: Progressing Goal: Emotional status will improve Outcome: Progressing Goal: Mental status will improve Outcome: Progressing Goal: Verbalization of understanding the information provided will improve Outcome: Progressing   

## 2020-03-29 NOTE — [facility] Group Notes (Signed)
[facility] Group Notes: (Clinical Social Work)   03/29/2020      Type of Therapy:  Group Therapy   Participation Level:  Did Not Attend despite MHT prompting   Issabella Rix N Keleigh Kazee, LCSW  03/29/2020 11:11 AM   

## 2020-03-29 NOTE — Progress Notes (Signed)
[facility] MD Progress Note  03/29/2020 1:26 PM Madison Boyer  MRN:  [id number] Subjective:   Id o not  Know I am sleepy Principal Problem: Schizoaffective disorder (HCC) Diagnosis: Principal Problem:   Schizoaffective disorder (HCC) Active Problems:   Acute psychosis (HCC)   Cannabis abuse  Total Time spent with patient: 25 min Past Psychiatric History:  Already written before   Past Medical History: History reviewed. No pertinent past medical history.  Past Surgical History:  Procedure Laterality Date  . APPENDECTOMY     Family History:  Family History  Problem Relation Age of Onset  . Healthy Mother   . Healthy Father   . Healthy Maternal Grandmother   . Diabetes Maternal Grandfather   . Healthy Paternal Grandmother   . Healthy Paternal Grandfather    Family Psychiatric  History: there before  Social History:  Social History   Substance and Sexual Activity  Alcohol Use Yes   Comment: occassionally     Social History   Substance and Sexual Activity  Drug Use Yes  . Types: Marijuana    Social History   Socioeconomic History  . Marital status: Single    Spouse name: Not on file  . Number of children: Not on file  . Years of education: Not on file  . Highest education level: Not on file  Occupational History  . Not on file  Tobacco Use  . Smoking status: Never Smoker  . Smokeless tobacco: Never Used  Vaping Use  . Vaping Use: Some days  Substance and Sexual Activity  . Alcohol use: Yes    Comment: occassionally  . Drug use: Yes    Types: Marijuana  . Sexual activity: Yes    Birth control/protection: Condom  Other Topics Concern  . Not on file  Social History Narrative   ** Merged History Encounter **       Social Determinants of Health   Financial Resource Strain:   . Difficulty of Paying Living Expenses:   Food Insecurity:   . Worried About Programme researcher, broadcasting/film/video in the Last Year:   . Barista in the Last Year:   Transportation  Needs:   . Freight forwarder (Medical):   Marland Kitchen Lack of Transportation (Non-Medical):   Physical Activity:   . Days of Exercise per Week:   . Minutes of Exercise per Session:   Stress:   . Feeling of Stress :   Social Connections:   . Frequency of Communication with Friends and Family:   . Frequency of Social Gatherings with Friends and Family:   . Attends Religious Services:   . Active Member of Clubs or Organizations:   . Attends Banker Meetings:   Marland Kitchen Marital Status:    Additional Social History:   She remains in room with lights out Strange and odd, vague not sharing much but says she is too sleepy.  Wants to go outside.  Slightly more talkative today  Awaits effects of trintellix and its effects on her mood   Haldol also adjusted to 7.5 qhs                           Sleep: fair, too sleepy in am   Appetite:  Fair   Current Medications: Current Facility-Administered Medications  Medication Dose Route Frequency Provider Last Rate Last Admin  . acetaminophen (TYLENOL) tablet 650 mg  650 mg Oral Q6H PRN Gillermo Murdoch, NP  650 mg at 03/27/20 0802  . alum & mag hydroxide-simeth (MAALOX/MYLANTA) 200-200-20 MG/5ML suspension 30 mL  30 mL Oral Q4H PRN Gillermo Murdoch, NP      . benztropine (COGENTIN) tablet 0.5 mg  0.5 mg Oral BID Gillermo Murdoch, NP   0.5 mg at 03/29/20 0754  . docusate sodium (COLACE) capsule 100 mg  100 mg Oral BID Clapacs, Jackquline Denmark, MD   100 mg at 03/29/20 0754  . [START ON 03/30/2020] haloperidol (HALDOL) tablet 7.5 mg  7.5 mg Oral QHS Roselind Messier, MD      . haloperidol decanoate (HALDOL DECANOATE) 100 MG/ML injection 100 mg  100 mg Intramuscular Q30 days Clapacs, Jackquline Denmark, MD   100 mg at 03/25/20 1257  . hydrOXYzine (ATARAX/VISTARIL) tablet 25 mg  25 mg Oral TID PRN Gillermo Murdoch, NP   25 mg at 03/29/20 0757  . ibuprofen (ADVIL) tablet 400 mg  400 mg Oral Q6H PRN Gillermo Murdoch, NP      . magnesium  hydroxide (MILK OF MAGNESIA) suspension 30 mL  30 mL Oral Daily PRN Gillermo Murdoch, NP   30 mL at 03/29/20 0755  . traZODone (DESYREL) tablet 50 mg  50 mg Oral QHS PRN Gillermo Murdoch, NP   50 mg at 03/27/20 2100  . [START ON 03/30/2020] vortioxetine HBr (TRINTELLIX) tablet 20 mg  20 mg Oral Daily Roselind Messier, MD        Lab Results: No results found for this or any previous visit (from the past 48 hour(s)).  Blood Alcohol level:  Lab Results  Component Value Date   ETH <10 03/24/2020   ETH <10 02/15/2020    Metabolic Disorder Labs: Lab Results  Component Value Date   HGBA1C 4.8 09/04/2018   MPG 91.06 09/04/2018   No results found for: PROLACTIN Lab Results  Component Value Date   CHOL 160 03/25/2020   TRIG 88 03/25/2020   HDL 56 03/25/2020   CHOLHDL 2.9 03/25/2020   VLDL 18 03/25/2020   LDLCALC 86 03/25/2020   LDLCALC 76 09/04/2018    Physical Findings: AIMS:  , ,  ,  ,    CIWA:    COWS:     Musculoskeletal: Strength & Muscle Tone: not known   Gait & Station: lying in b ed  Patient leans: none   Psychiatric Specialty Exam: Physical Exam  Review of Systems  Blood pressure 122/66, pulse 78, temperature 98.5 F (36.9 C), temperature source Oral, resp. rate 18, height 5\' 2"  (1.575 m), weight 81.6 kg, SpO2 99 %.Body mass index is 32.9 kg/m.    Briefer MS    Alert vague ,lying in bed. Slow to respond --- Oriented to person place and time Not clear if she still has psychosis  No major mania No shakes tremors tics dystonia, EPS akathisia Not that cooperative but slightly more animated Judgment insight reliability poor for now SI and HI not known at present Consciousness not clouded or fluctuant Speech low tone volume fluency  Concentration and attention --distracted somewhat  preoccupied with staying in bed                                         ADL's:  Limited to some degree for now   Cognition:  Not clear   Sleep:  Number  of Hours: 7.75     Treatment Plan Summary:   Continues present plan with  current med changes --_ESL  About 5 days   Eulas Post, MD 03/29/2020, 1:26 PM

## 2020-03-30 MED ORDER — HALOPERIDOL 5 MG PO TABS
10.0000 mg | ORAL_TABLET | Freq: Every day | ORAL | Status: DC
  Administered 2020-03-30: 10 mg via ORAL
  Filled 2020-03-30: qty 2

## 2020-03-30 NOTE — Plan of Care (Signed)
  Problem: Education: Goal: Knowledge of  General Education information/materials will improve Outcome: Not Progressing Goal: Emotional status will improve Outcome: Not Progressing Goal: Mental status will improve Outcome: Not Progressing Goal: Verbalization of understanding the information provided will improve Outcome: Not Progressing   Problem: Safety: Goal: Periods of time without injury will increase Outcome: Not Progressing   

## 2020-03-30 NOTE — Plan of Care (Signed)
Patient rated her depression 0/10 and anxiety 7/10.Patient stated that there is no triggering reason for anxiety.Patient stayed in bed with showing no initiation for any activities or talking to staff.Denies SI,Hi and AVH.Compliant with medications.Appetite and energy level good.Support and encouragement given.

## 2020-03-30 NOTE — Progress Notes (Signed)
Patient has denied SI, HI and AVH. Affect is brighter. Appears less anxious. Still some pacing in the halls. Asking for coloring pages. Says she is looking forward to getting better and leaving the hospital.

## 2020-03-30 NOTE — Progress Notes (Signed)
Patient has been anxious and fearful. Crying and asking for her Haldol decanoate shot. Says she thinks she sold her soul to the devil. Offered reassurance and medicated patient per prn order for anxiety. Patient slept without any issues.

## 2020-03-30 NOTE — [facility] Group Notes (Signed)
LCSW Group Therapy Note   03/30/2020 2:33 PM  Type of Therapy and Topic:  Group Therapy:  Overcoming Obstacles   Participation Level:  Did Not Attend   Description of Group:    In this group patients will be encouraged to explore what they see as obstacles to their own wellness and recovery. They will be guided to discuss their thoughts, feelings, and behaviors related to these obstacles. The group will process together ways to cope with barriers, with attention given to specific choices patients can make. Each patient will be challenged to identify changes they are motivated to make in order to overcome their obstacles. This group will be process-oriented, with patients participating in exploration of their own experiences as well as giving and receiving support and challenge from other group members.   Therapeutic Goals: 1. Patient will identify personal and current obstacles as they relate to admission. 2. Patient will identify barriers that currently interfere with their wellness or overcoming obstacles.  3. Patient will identify feelings, thought process and behaviors related to these barriers. 4. Patient will identify two changes they are willing to make to overcome these obstacles:      Summary of Patient Progress X   Therapeutic Modalities:   Cognitive Behavioral Therapy Solution Focused Therapy Motivational Interviewing Relapse Prevention Therapy  Penni Homans, MSW, LCSW 03/30/2020 2:33 PM

## 2020-03-30 NOTE — Progress Notes (Signed)
Patient is watching TV,interacting with peers.Patients affect brightens on approach.Doing her ADLs.Appropriate with staff & peers.Denies SI,HI and AVH.Support and encouragement given.

## 2020-03-30 NOTE — Progress Notes (Signed)
[facility] MD Progress Note  03/30/2020 4:43 PM Madison Boyer  MRN:  [id number] Subjective: Follow-up for this 19 year old with a history of schizophrenia.  Patient reports that she is feeling better.  She says she no longer thinks about having sold her soul.  Denies any suicidal thoughts.  She was able to sit and hold a lucid conversation without whining crying or repeating herself.  Denies having any recent hallucinations.  She does talk about how she plans to move to [place] next semester but is not acting nearly as bizarre today and is taking care of her ADLs adequately Principal Problem: Schizoaffective disorder (HCC) Diagnosis: Principal Problem:   Schizoaffective disorder (HCC) Active Problems:   Acute psychosis (HCC)   Cannabis abuse  Total Time spent with patient: 30 minutes  Past Psychiatric History: History of multiple hospitalizations with psychotic symptoms  Past Medical History: History reviewed. No pertinent past medical history.  Past Surgical History:  Procedure Laterality Date  . APPENDECTOMY     Family History:  Family History  Problem Relation Age of Onset  . Healthy Mother   . Healthy Father   . Healthy Maternal Grandmother   . Diabetes Maternal Grandfather   . Healthy Paternal Grandmother   . Healthy Paternal Grandfather    Family Psychiatric  History: See previous Social History:  Social History   Substance and Sexual Activity  Alcohol Use Yes   Comment: occassionally     Social History   Substance and Sexual Activity  Drug Use Yes  . Types: Marijuana    Social History   Socioeconomic History  . Marital status: Single    Spouse name: Not on file  . Number of children: Not on file  . Years of education: Not on file  . Highest education level: Not on file  Occupational History  . Not on file  Tobacco Use  . Smoking status: Never Smoker  . Smokeless tobacco: Never Used  Vaping Use  . Vaping Use: Some days  Substance and Sexual Activity   . Alcohol use: Yes    Comment: occassionally  . Drug use: Yes    Types: Marijuana  . Sexual activity: Yes    Birth control/protection: Condom  Other Topics Concern  . Not on file  Social History Narrative   ** Merged History Encounter **       Social Determinants of Health   Financial Resource Strain:   . Difficulty of Paying Living Expenses:   Food Insecurity:   . Worried About Programme researcher, broadcasting/film/video in the Last Year:   . Barista in the Last Year:   Transportation Needs:   . Freight forwarder (Medical):   Marland Kitchen Lack of Transportation (Non-Medical):   Physical Activity:   . Days of Exercise per Week:   . Minutes of Exercise per Session:   Stress:   . Feeling of Stress :   Social Connections:   . Frequency of Communication with Friends and Family:   . Frequency of Social Gatherings with Friends and Family:   . Attends Religious Services:   . Active Member of Clubs or Organizations:   . Attends Banker Meetings:   Marland Kitchen Marital Status:    Additional Social History:                         Sleep: Fair  Appetite:  Fair  Current Medications: Current Facility-Administered Medications  Medication Dose Route Frequency Provider Last  Rate Last Admin  . acetaminophen (TYLENOL) tablet 650 mg  650 mg Oral Q6H PRN Gillermo Murdoch, NP   650 mg at 03/27/20 0802  . alum & mag hydroxide-simeth (MAALOX/MYLANTA) 200-200-20 MG/5ML suspension 30 mL  30 mL Oral Q4H PRN Gillermo Murdoch, NP      . benztropine (COGENTIN) tablet 0.5 mg  0.5 mg Oral BID Gillermo Murdoch, NP   0.5 mg at 03/30/20 1634  . docusate sodium (COLACE) capsule 100 mg  100 mg Oral BID Rory Montel T, MD   100 mg at 03/30/20 1634  . haloperidol (HALDOL) tablet 7.5 mg  7.5 mg Oral QHS Roselind Messier, MD      . haloperidol decanoate (HALDOL DECANOATE) 100 MG/ML injection 100 mg  100 mg Intramuscular Q30 days Azka Steger T, MD   100 mg at 03/25/20 1257  . hydrOXYzine  (ATARAX/VISTARIL) tablet 25 mg  25 mg Oral TID PRN Gillermo Murdoch, NP   25 mg at 03/29/20 1959  . ibuprofen (ADVIL) tablet 400 mg  400 mg Oral Q6H PRN Gillermo Murdoch, NP   400 mg at 03/29/20 1959  . magnesium hydroxide (MILK OF MAGNESIA) suspension 30 mL  30 mL Oral Daily PRN Gillermo Murdoch, NP   30 mL at 03/29/20 1959  . traZODone (DESYREL) tablet 50 mg  50 mg Oral QHS PRN Gillermo Murdoch, NP   50 mg at 03/29/20 1959  . vortioxetine HBr (TRINTELLIX) tablet 20 mg  20 mg Oral Daily Roselind Messier, MD   20 mg at 03/30/20 0802    Lab Results: No results found for this or any previous visit (from the past 48 hour(s)).  Blood Alcohol level:  Lab Results  Component Value Date   ETH <10 03/24/2020   ETH <10 02/15/2020    Metabolic Disorder Labs: Lab Results  Component Value Date   HGBA1C 4.8 09/04/2018   MPG 91.06 09/04/2018   No results found for: PROLACTIN Lab Results  Component Value Date   CHOL 160 03/25/2020   TRIG 88 03/25/2020   HDL 56 03/25/2020   CHOLHDL 2.9 03/25/2020   VLDL 18 03/25/2020   LDLCALC 86 03/25/2020   LDLCALC 76 09/04/2018    Physical Findings: AIMS:  , ,  ,  ,    CIWA:    COWS:     Musculoskeletal: Strength & Muscle Tone: within normal limits Gait & Station: normal Patient leans: N/A  Psychiatric Specialty Exam: Physical Exam Vitals and nursing note reviewed.  Constitutional:      Appearance: She is well-developed.  HENT:     Head: Normocephalic and atraumatic.  Eyes:     Conjunctiva/sclera: Conjunctivae normal.     Pupils: Pupils are equal, round, and reactive to light.  Cardiovascular:     Heart sounds: Normal heart sounds.  Pulmonary:     Effort: Pulmonary effort is normal.  Abdominal:     Palpations: Abdomen is soft.  Musculoskeletal:        General: Normal range of motion.     Cervical back: Normal range of motion.  Skin:    General: Skin is warm and dry.  Neurological:     General: No focal deficit  present.     Mental Status: She is alert.  Psychiatric:        Attention and Perception: She is inattentive.        Mood and Affect: Mood normal.        Speech: Speech normal.        Behavior:  Behavior is agitated. Behavior is not aggressive.        Thought Content: Thought content is not paranoid. Thought content does not include homicidal or suicidal ideation.        Cognition and Memory: Cognition is impaired.        Judgment: Judgment is impulsive.     Review of Systems  Constitutional: Negative.   HENT: Negative.   Eyes: Negative.   Respiratory: Negative.   Cardiovascular: Negative.   Gastrointestinal: Negative.   Musculoskeletal: Negative.   Skin: Negative.   Neurological: Negative.   Psychiatric/Behavioral: The patient is nervous/anxious.     Blood pressure 121/68, pulse 66, temperature 99 F (37.2 C), temperature source Oral, resp. rate 18, height 5\' 2"  (1.575 m), weight 81.6 kg, SpO2 100 %.Body mass index is 32.9 kg/m.  General Appearance: Casual  Eye Contact:  Good  Speech:  Clear and Coherent  Volume:  Normal  Mood:  Euthymic  Affect:  Congruent  Thought Process:  Goal Directed  Orientation:  Full (Time, Place, and Person)  Thought Content:  Rumination and Tangential  Suicidal Thoughts:  No  Homicidal Thoughts:  No  Memory:  Immediate;   Fair Recent;   Fair Remote;   Fair  Judgement:  Fair  Insight:  Fair  Psychomotor Activity:  Normal  Concentration:  Concentration: Fair  Recall:  of Knowledge:  Fair  Language:  Fair  Akathisia:  No  Handed:  Right  AIMS (if indicated):     Assets:  Desire for Improvement Housing Physical Health Resilience  ADL's:  Intact  Cognition:  Impaired,  Mild  Sleep:  Number of Hours: 7     Treatment Plan Summary: Daily contact with patient to assess and evaluate symptoms and progress in treatment, Medication management and Plan Certainly looking significantly better today than she has for the last few days.   Try to engage her in some conversation about the importance of maintaining outpatient treatment.  We will consider possible discharge tomorrow  [employer], MD 03/30/2020, 4:43 PM

## 2020-03-30 NOTE — Plan of Care (Signed)
  Problem: Education: Goal: Knowledge of [place] [facility] Education information/materials will improve Outcome: Progressing Goal: Emotional status will improve Outcome: Progressing Goal: Mental status will improve Outcome: Progressing Goal: Verbalization of understanding the information provided will improve Outcome: Progressing   Problem: Safety: Goal: Periods of time without injury will increase Outcome: Progressing   

## 2020-03-30 NOTE — Progress Notes (Signed)
Recreation Therapy Notes  Date: 03/30/2020  Time: 9:30 am   Location: Craft room    Behavioral response: N/A   Intervention Topic: Coping Skills    Discussion/Intervention: Patient did not attend group.   Clinical Observations/Feedback:  Patient did not attend group.   Yasmyn Bellisario LRT/CTRS        Latorria Zeoli 03/30/2020 11:11 AM 

## 2020-03-31 MED ORDER — BENZTROPINE MESYLATE 0.5 MG PO TABS: 0.5000 mg | ORAL_TABLET | Freq: Two times a day (BID) | ORAL | 1 refills | Status: DC

## 2020-03-31 MED ORDER — HALOPERIDOL 10 MG PO TABS: 10.0000 mg | ORAL_TABLET | Freq: Every day | ORAL | 1 refills | Status: DC

## 2020-03-31 MED ORDER — DOCUSATE SODIUM 100 MG PO CAPS: 100.0000 mg | ORAL_CAPSULE | Freq: Two times a day (BID) | ORAL | 1 refills | Status: DC

## 2020-03-31 MED ORDER — TRAZODONE HCL 50 MG PO TABS: 50.0000 mg | ORAL_TABLET | Freq: Every evening | ORAL | 1 refills | Status: DC | PRN

## 2020-03-31 MED ORDER — HALOPERIDOL DECANOATE 100 MG/ML IM SOLN: 100.0000 mg | INTRAMUSCULAR | 1 refills | Status: DC

## 2020-03-31 NOTE — Tx Team (Signed)
Interdisciplinary Treatment and Diagnostic Plan Update  03/31/2020 Time of Session: 830am Roselina Burgueno MRN: [id number]  Principal Diagnosis: Schizoaffective disorder [facility])  Secondary Diagnoses: Principal Problem:   Schizoaffective disorder (HCC) Active Problems:   Acute psychosis (HCC)   Cannabis abuse   Current Medications:  Current Facility-Administered Medications  Medication Dose Route Frequency Provider Last Rate Last Admin  . acetaminophen (TYLENOL) tablet 650 mg  650 mg Oral Q6H PRN Gillermo Murdoch, NP   650 mg at 03/27/20 0802  . alum & mag hydroxide-simeth (MAALOX/MYLANTA) 200-200-20 MG/5ML suspension 30 mL  30 mL Oral Q4H PRN Gillermo Murdoch, NP      . benztropine (COGENTIN) tablet 0.5 mg  0.5 mg Oral BID Gillermo Murdoch, NP   0.5 mg at 03/31/20 0754  . docusate sodium (COLACE) capsule 100 mg  100 mg Oral BID Clapacs, John T, MD   100 mg at 03/31/20 0753  . haloperidol (HALDOL) tablet 10 mg  10 mg Oral QHS Clapacs, John T, MD   10 mg at 03/30/20 2107  . haloperidol decanoate (HALDOL DECANOATE) 100 MG/ML injection 100 mg  100 mg Intramuscular Q30 days Clapacs, John T, MD   100 mg at 03/25/20 1257  . hydrOXYzine (ATARAX/VISTARIL) tablet 25 mg  25 mg Oral TID PRN Gillermo Murdoch, NP   25 mg at 03/29/20 1959  . ibuprofen (ADVIL) tablet 400 mg  400 mg Oral Q6H PRN Gillermo Murdoch, NP   400 mg at 03/29/20 1959  . magnesium hydroxide (MILK OF MAGNESIA) suspension 30 mL  30 mL Oral Daily PRN Gillermo Murdoch, NP   30 mL at 03/29/20 1959  . traZODone (DESYREL) tablet 50 mg  50 mg Oral QHS PRN Gillermo Murdoch, NP   50 mg at 03/30/20 2107   PTA Medications: Medications Prior to Admission  Medication Sig Dispense Refill Last Dose  . haloperidol (HALDOL) 5 MG tablet Take 1 tablet (5 mg total) by mouth 2 (two) times daily. 20 tablet 0   . haloperidol decanoate (HALDOL DECANOATE) 100 MG/ML injection Inject 1 mL (100 mg total) into the muscle  every 30 (thirty) days. Next dose due on March 25, 2020 1 mL 1   . ibuprofen (MOTRIN IB) 200 MG tablet Take 2 tablets (400 mg total) by mouth every 6 (six) hours as needed. (Patient not taking: Reported on 03/24/2020) 30 tablet 0   . [DISCONTINUED] benztropine (COGENTIN) 0.5 MG tablet Take 1 tablet (0.5 mg total) by mouth 2 (two) times daily. 60 tablet 1   . [DISCONTINUED] traZODone (DESYREL) 50 MG tablet Take 1 tablet (50 mg total) by mouth at bedtime as needed for sleep. 30 tablet 1     Patient Stressors: Medication change or noncompliance Substance abuse  Patient Strengths: Ability for insight Motivation for treatment/growth  Treatment Modalities: Medication Management, Group therapy, Case management,  1 to 1 session with clinician, Psychoeducation, Recreational therapy.   Physician Treatment Plan for Primary Diagnosis: Schizoaffective disorder (HCC) Long Term Goal(s): Improvement in symptoms so as ready for discharge Improvement in symptoms so as ready for discharge   Short Term Goals: Ability to verbalize feelings will improve Ability to demonstrate self-control will improve Ability to identify and develop effective coping behaviors will improve Compliance with prescribed medications will improve  Medication Management: Evaluate patient's response, side effects, and tolerance of medication regimen.  Therapeutic Interventions: 1 to 1 sessions, Unit Group sessions and Medication administration.  Evaluation of Outcomes: Adequate for Discharge  Physician Treatment Plan for Secondary Diagnosis: Principal Problem:  Schizoaffective disorder (HCC) Active Problems:   Acute psychosis (HCC)   Cannabis abuse  Long Term Goal(s): Improvement in symptoms so as ready for discharge Improvement in symptoms so as ready for discharge   Short Term Goals: Ability to verbalize feelings will improve Ability to demonstrate self-control will improve Ability to identify and develop effective  coping behaviors will improve Compliance with prescribed medications will improve     Medication Management: Evaluate patient's response, side effects, and tolerance of medication regimen.  Therapeutic Interventions: 1 to 1 sessions, Unit Group sessions and Medication administration.  Evaluation of Outcomes: Adequate for Discharge   RN Treatment Plan for Primary Diagnosis: Schizoaffective disorder (HCC) Long Term Goal(s): Knowledge of disease and therapeutic regimen to maintain health will improve  Short Term Goals: Ability to identify and develop effective coping behaviors will improve and Compliance with prescribed medications will improve  Medication Management: RN will administer medications as ordered by provider, will assess and evaluate patient's response and provide education to patient for prescribed medication. RN will report any adverse and/or side effects to prescribing provider.  Therapeutic Interventions: 1 on 1 counseling sessions, Psychoeducation, Medication administration, Evaluate responses to treatment, Monitor vital signs and CBGs as ordered, Perform/monitor CIWA, COWS, AIMS and Fall Risk screenings as ordered, Perform wound care treatments as ordered.  Evaluation of Outcomes: Adequate for Discharge   LCSW Treatment Plan for Primary Diagnosis: Schizoaffective disorder [facility]) Long Term Goal(s): Safe transition to appropriate next level of care at discharge, Engage patient in therapeutic group addressing interpersonal concerns.  Short Term Goals: Engage patient in aftercare planning with referrals and resources  Therapeutic Interventions: Assess for all discharge needs, 1 to 1 time with Social worker, Explore available resources and support systems, Assess for adequacy in community support network, Educate family and significant other(s) on suicide prevention, Complete Psychosocial Assessment, Interpersonal group therapy.  Evaluation of Outcomes: Adequate for  Discharge   Progress in Treatment: Attending groups: Yes. Participating in groups: No. Taking medication as prescribed: Yes. Toleration medication: Yes. Family/Significant other contact made: Yes, individual(s) contacted:  with pt, declined family contact Patient understands diagnosis: Yes. Discussing patient identified problems/goals with staff: Yes. Medical problems stabilized or resolved: Yes. Denies suicidal/homicidal ideation: Yes. Issues/concerns per patient self-inventory: No. Other: NA  New problem(s) identified: No, Describe:  None reported  New Short Term/Long Term Goal(s):   Patient Goals:  Pt given opportunity to participate in team meeting but declined. No goal identified at this time.  Discharge Plan or Barriers: Pt lives with parents and sees PCP for medication management. Update 03/31/20- Pt is scheduled for discharge today. Pt is agreeable to follow up at [facility] for outpatient treatment. Pt states she plans to return to her parents home.  Reason for Continuation of Hospitalization: N/A. D/C 03/31/20  Estimated Length of Stay:1-7 days  Recreational Therapy: Patient: N/A Patient Goal: Patient will engage in groups without prompting or encouragement from LRT x3 group sessions within 5 recreation therapy group sessions.  Attendees: Patient: 03/31/2020 10:50 AM  Physician: Mordecai Rasmussen 03/31/2020 10:50 AM  Nursing: Horald Pollen Manuaritti 03/31/2020 10:50 AM  RN Care Manager: 03/31/2020 10:50 AM  Social Worker: Penni Homans Lowella Dandy 03/31/2020 10:50 AM  Recreational Therapist:  03/31/2020 10:50 AM  Other:  03/31/2020 10:50 AM  Other:  03/31/2020 10:50 AM  Other: 03/31/2020 10:50 AM    Scribe for Treatment Team: Suzan Slick, LCSW 03/31/2020 10:50 AM

## 2020-03-31 NOTE — Progress Notes (Signed)
Pt denies SI, HI and AVH. Pt was educated on dc plan and verbalizes understanding. Pt received dc packet and prescriptions. Torrie Mayers RN

## 2020-03-31 NOTE — Progress Notes (Signed)
Recreation Therapy Notes  INPATIENT RECREATION TR PLAN  Patient Details Name: Madison Boyer MRN: [id number] DOB: [birth year] Today's Date: 03/31/2020  Rec Therapy Plan Is patient appropriate for Therapeutic Recreation?: Yes Treatment times per week: at least 3 Estimated Length of Stay: 5-7 days TR Treatment/Interventions: Group participation (Comment)  Discharge Criteria Pt will be discharged from therapy if:: Discharged Treatment plan/goals/alternatives discussed and agreed upon by:: Patient/family  Discharge Summary Short term goals set: Patient will engage in groups without prompting or encouragement from LRT x3 group sessions within 5 recreation therapy group sessions Short term goals met: Not met Reason goals not met: Patient did not attend any groups Therapeutic equipment acquired: N/A Reason patient discharged from therapy: Discharge from hospital Pt/family agrees with progress & goals achieved: Yes Date patient discharged from therapy: 03/31/20   Carmin Alvidrez 03/31/2020, 11:54 AM

## 2020-03-31 NOTE — Progress Notes (Signed)
Recreation Therapy Notes  Date: 03/31/2020  Time: 9:30 am   Location: Craft room    Behavioral response: N/A   Intervention Topic: Relaxation   Discussion/Intervention: Patient did not attend group.   Clinical Observations/Feedback:  Patient did not attend group.   Tieara Flitton LRT/CTRS        Harmoni Lucus 03/31/2020 11:13 AM

## 2020-03-31 NOTE — Discharge Summary (Signed)
Physician Discharge Summary Note  Patient:  Madison Boyer is an 19 y.o., female MRN:  [id number] DOB:  10/23/2000 Patient phone:  [phone] (home)  Patient address:   [address] [place] [address],  Total Time spent with patient: 30 minutes  Date of Admission:  03/25/2020 Date of Discharge: 03/31/2020  Reason for Admission: Admitted because of continued psychotic symptoms and statements about wishing she were dead  Principal Problem: Schizoaffective disorder [facility]) Discharge Diagnoses: Principal Problem:   Schizoaffective disorder (HCC) Active Problems:   Acute psychosis (HCC)   Cannabis abuse   Past Psychiatric History: Fairly recent onset in this young woman of psychotic symptoms resulting however and multiple hospitalizations  Past Medical History: History reviewed. No pertinent past medical history.  Past Surgical History:  Procedure Laterality Date  . APPENDECTOMY     Family History:  Family History  Problem Relation Age of Onset  . Healthy Mother   . Healthy Father   . Healthy Maternal Grandmother   . Diabetes Maternal Grandfather   . Healthy Paternal Grandmother   . Healthy Paternal Grandfather    Family Psychiatric  History: See previous Social History:  Social History   Substance and Sexual Activity  Alcohol Use Yes   Comment: occassionally     Social History   Substance and Sexual Activity  Drug Use Yes  . Types: Marijuana    Social History   Socioeconomic History  . Marital status: Single    Spouse name: Not on file  . Number of children: Not on file  . Years of education: Not on file  . Highest education level: Not on file  Occupational History  . Not on file  Tobacco Use  . Smoking status: Never Smoker  . Smokeless tobacco: Never Used  Vaping Use  . Vaping Use: Some days  Substance and Sexual Activity  . Alcohol use: Yes    Comment: occassionally  . Drug use: Yes    Types: Marijuana  . Sexual activity: Yes     Birth control/protection: Condom  Other Topics Concern  . Not on file  Social History Narrative   ** Merged History Encounter **       Social Determinants of Health   Financial Resource Strain:   . Difficulty of Paying Living Expenses:   Food Insecurity:   . Worried About Programme researcher, broadcasting/film/video in the Last Year:   . Barista in the Last Year:   Transportation Needs:   . Freight forwarder (Medical):   Marland Kitchen Lack of Transportation (Non-Medical):   Physical Activity:   . Days of Exercise per Week:   . Minutes of Exercise per Session:   Stress:   . Feeling of Stress :   Social Connections:   . Frequency of Communication with Friends and Family:   . Frequency of Social Gatherings with Friends and Family:   . Attends Religious Services:   . Active Member of Clubs or Organizations:   . Attends Banker Meetings:   Marland Kitchen Marital Status:     Hospital Course: Admitted to psychiatric unit.  Continue Yolanda haloperidol.  Haloperidol decanoate shot given on the 23rd.  Initially patient was disorganized childlike difficult to communicate with.  Over the last several days has shown significant improvement.  Now taking care of her hygiene better, lucid, able to carry on a rational conversation.  Patient has been educated about the importance of staying on her medicine and agrees to follow-up treatment with  [facility].  Prescriptions will be given for her current antipsychotic medication at discharge.  She is not reporting any suicidal or homicidal ideation and has not displayed any behavior of self-harm or aggression during her hospital stay  Physical Findings: AIMS:  , ,  ,  ,    CIWA:    COWS:     Musculoskeletal: Strength & Muscle Tone: within normal limits Gait & Station: normal Patient leans: N/A  Psychiatric Specialty Exam: Physical Exam Vitals and nursing note reviewed.  Constitutional:      Appearance: She is well-developed.  HENT:     Head: Normocephalic and  atraumatic.  Eyes:     Conjunctiva/sclera: Conjunctivae normal.     Pupils: Pupils are equal, round, and reactive to light.  Cardiovascular:     Heart sounds: Normal heart sounds.  Pulmonary:     Effort: Pulmonary effort is normal.  Abdominal:     Palpations: Abdomen is soft.  Musculoskeletal:        General: Normal range of motion.     Cervical back: Normal range of motion.  Skin:    General: Skin is warm and dry.  Neurological:     General: No focal deficit present.     Mental Status: She is alert.  Psychiatric:        Mood and Affect: Mood normal.     Review of Systems  Constitutional: Negative.   HENT: Negative.   Eyes: Negative.   Respiratory: Negative.   Cardiovascular: Negative.   Gastrointestinal: Negative.   Musculoskeletal: Negative.   Skin: Negative.   Neurological: Negative.   Psychiatric/Behavioral: Negative.     Blood pressure 122/81, pulse 95, temperature 98 F (36.7 C), temperature source Oral, resp. rate 18, height 5\' 2"  (1.575 m), weight 81.6 kg, SpO2 100 %.Body mass index is 32.9 kg/m.  General Appearance: Casual  Eye Contact:  Good  Speech:  Clear and Coherent  Volume:  Normal  Mood:  Euthymic  Affect:  Congruent  Thought Process:  Coherent  Orientation:  Full (Time, Place, and Person)  Thought Content:  Logical  Suicidal Thoughts:  No  Homicidal Thoughts:  No  Memory:  Immediate;   Fair Recent;   Fair Remote;   Fair  Judgement:  Fair  Insight:  Fair  Psychomotor Activity:  Normal  Concentration:  Concentration: Fair  Recall:  Fair  Fund of Knowledge:  Fair  Language:  Fair  Akathisia:  No  Handed:  Right  AIMS (if indicated):     Assets:  Desire for Improvement Housing Physical Health Resilience Social Support  ADL's:  Intact  Cognition:  WNL  Sleep:  Number of Hours: 8     Have you used any form of tobacco in the last 30 days? (Cigarettes, Smokeless Tobacco, Cigars, and/or Pipes): No  Has this patient used any form of  tobacco in the last 30 days? (Cigarettes, Smokeless Tobacco, Cigars, and/or Pipes) Yes, No  Blood Alcohol level:  Lab Results  Component Value Date   ETH <10 03/24/2020   ETH <10 02/15/2020    Metabolic Disorder Labs:  Lab Results  Component Value Date   HGBA1C 4.8 09/04/2018   MPG 91.06 09/04/2018   No results found for: PROLACTIN Lab Results  Component Value Date   CHOL 160 03/25/2020   TRIG 88 03/25/2020   HDL 56 03/25/2020   CHOLHDL 2.9 03/25/2020   VLDL 18 03/25/2020   LDLCALC 86 03/25/2020   LDLCALC 76 09/04/2018    See Psychiatric  Specialty Exam and Suicide Risk Assessment completed by Attending Physician prior to discharge.  Discharge destination:  Home  Is patient on multiple antipsychotic therapies at discharge:  No   Has Patient had three or more failed trials of antipsychotic monotherapy by history:  No  Recommended Plan for Multiple Antipsychotic Therapies: NA  Discharge Instructions    Diet - low sodium heart healthy   Complete by: As directed    Increase activity slowly   Complete by: As directed      Allergies as of 03/31/2020   No Known Allergies     Medication List    STOP taking these medications   ibuprofen 200 MG tablet Commonly known as: Motrin IB     TAKE these medications     Indication  benztropine 0.5 MG tablet Commonly known as: COGENTIN Take 1 tablet (0.5 mg total) by mouth 2 (two) times daily.  Indication: Extrapyramidal Reaction caused by Medications   docusate sodium 100 MG capsule Commonly known as: COLACE Take 1 capsule (100 mg total) by mouth 2 (two) times daily.  Indication: Constipation   haloperidol 10 MG tablet Commonly known as: HALDOL Take 1 tablet (10 mg total) by mouth at bedtime. What changed:   medication strength  how much to take  when to take this  Indication: Psychosis   haloperidol decanoate 100 MG/ML injection Commonly known as: HALDOL DECANOATE Inject 1 mL (100 mg total) into the muscle  every 30 (thirty) days. Next dose due approximately April 24, 2020 Start taking on: April 24, 2020 What changed: additional instructions  Indication: Schizophrenia   traZODone 50 MG tablet Commonly known as: DESYREL Take 1 tablet (50 mg total) by mouth at bedtime as needed for sleep.  Indication: Trouble Sleeping       Follow-up Information    [facility] Follow up on 03/31/2020.   Why: You are scheduled to meet with Dr. Francetta Found on Tuesday, June 29th at 1pm. Thank you. Contact information: [address] [place], [place] [address] Phone: [phone] Fax: [phone]       [facility] Go on 04/08/2020.   Why: You are scheduled for Wednesday, July 7th at 930am. Please bring hospital discharge paperwork. Thank you. Contact information: [address] [place] [address] [phone]               Follow-up recommendations:  Activity:  Activity as tolerated Diet:  Regular diet Other:  Follow-up with [facility]  Comments: Prescriptions provided at discharge  Signed: Mordecai Rasmussen, MD 03/31/2020, 9:54 AM

## 2020-03-31 NOTE — Plan of Care (Signed)
Pt denies depression and rates anxiety 5/10. Pt denies SI, HI and AVH. Pt was educated on care plan and verbalizes understanding. Pt was encouraged to attend groups until discharge. Torrie Mayers RN Problem: Education: Goal: Knowledge of [facility] Education information/materials will improve Outcome: Adequate for Discharge Goal: Emotional status will improve Outcome: Adequate for Discharge Goal: Mental status will improve Outcome: Adequate for Discharge Goal: Verbalization of understanding the information provided will improve Outcome: Adequate for Discharge   Problem: Safety: Goal: Periods of time without injury will increase Outcome: Adequate for Discharge   Problem: Activity: Goal: Will verbalize the importance of balancing activity with adequate rest periods Outcome: Adequate for Discharge   Problem: Safety: Goal: Ability to redirect hostility and anger into socially appropriate behaviors will improve Outcome: Adequate for Discharge Goal: Ability to remain free from injury will improve Outcome: Adequate for Discharge

## 2020-03-31 NOTE — Progress Notes (Signed)
  [facility] Adult Case Management Discharge Plan :  Will you be returning to the same living situation after discharge:  Yes,  pt reports that she is returning to the home with her parents. At discharge, do you have transportation home?: Yes,  pt reports that her uncle will provide transportation home.  Do you have the ability to pay for your medications: Yes,  [employer] [employer].  Release of information consent forms completed and in the chart;  Patient's signature needed at discharge.  Patient to Follow up at:  Follow-up Information    [facility] Pediatric Follow up on 03/31/2020.   Why: You are scheduled to meet with Dr. Francetta Found on Tuesday, June 29th at 1pm. Thank you. Contact information: [address] [place], [place] [address] Phone: [phone] Fax: [phone]       [facility] Go on 04/08/2020.   Why: You are scheduled for Wednesday, July 7th at 930am. Please bring hospital discharge paperwork. Thank you. Contact information: [address] [place] [address] [phone]               Next level of care provider has access to [facility] Link:no  Safety Planning and Suicide Prevention discussed: No. SPE completed with patient.  Patient declined any collateral contact.  Have you used any form of tobacco in the last 30 days? (Cigarettes, Smokeless Tobacco, Cigars, and/or Pipes): No  Has patient been referred to the Quitline?: N/A patient is not a smoker  Patient has been referred for addiction treatment: N/A  Harden Mo, LCSW 03/31/2020, 10:49 AM

## 2020-03-31 NOTE — [facility] Suicide Risk Assessment (Signed)
[facility] Discharge Suicide Risk Assessment   Principal Problem: Schizoaffective disorder [facility]) Discharge Diagnoses: Principal Problem:   Schizoaffective disorder (HCC) Active Problems:   Acute psychosis (HCC)   Cannabis abuse   Total Time spent with patient: 30 minutes  Musculoskeletal: Strength & Muscle Tone: within normal limits Gait & Station: normal Patient leans: N/A  Psychiatric Specialty Exam: Review of Systems  Constitutional: Negative.   HENT: Negative.   Eyes: Negative.   Respiratory: Negative.   Cardiovascular: Negative.   Gastrointestinal: Negative.   Musculoskeletal: Negative.   Skin: Negative.   Neurological: Negative.   Psychiatric/Behavioral: Negative.     Blood pressure 122/81, pulse 95, temperature 98 F (36.7 C), temperature source Oral, resp. rate 18, height 5\' 2"  (1.575 m), weight 81.6 kg, SpO2 100 %.Body mass index is 32.9 kg/m.  General Appearance: Casual  Eye Contact::  Fair  Speech:  Clear and Coherent409  Volume:  Normal  Mood:  Euthymic  Affect:  Congruent  Thought Process:  Coherent  Orientation:  Full (Time, Place, and Person)  Thought Content:  Logical  Suicidal Thoughts:  No  Homicidal Thoughts:  No  Memory:  Immediate;   Fair Recent;   Fair Remote;   Fair  Judgement:  Fair  Insight:  Fair  Psychomotor Activity:  Normal  Concentration:  Fair  Recall:  [phone] of Knowledge:Fair  Language: Fair  Akathisia:  No  Handed:  Right  AIMS (if indicated):     Assets:  Desire for Improvement Housing Physical Health Resilience  Sleep:  Number of Hours: 8  Cognition: WNL  ADL's:  Intact   Mental Status Per Nursing Assessment::   On Admission:  NA  Demographic Factors:  Adolescent or young adult  Loss Factors: NA  Historical Factors: Impulsivity  Risk Reduction Factors:   Religious beliefs about death, Living with another person, especially a relative and Positive social support  Continued Clinical Symptoms:   Schizophrenia:   Less than 13 years old  Cognitive Features That Contribute To Risk:  None    Suicide Risk:  Minimal: No identifiable suicidal ideation.  Patients presenting with no risk factors but with morbid ruminations; may be classified as minimal risk based on the severity of the depressive symptoms   Follow-up Information    [facility] Follow up on 03/31/2020.   Why: You are scheduled to meet with Dr. 04/02/2020 on Tuesday, June 29th at 1pm. Thank you. Contact information: [address] [place], [place] Phone: [phone] Fax: [phone]       [facility] Go on 04/08/2020.   Why: You are scheduled for Wednesday, July 7th at 930am. Please bring hospital discharge paperwork. Thank you. Contact information: [address] [place] [phone]               Plan Of Care/Follow-up recommendations:  Activity:  Activity as tolerated Diet:  Regular diet Other:  Follow-up with outpatient treatment with [facility]  [phone], MD 03/31/2020, 9:49 AM

## 2020-03-31 NOTE — Plan of Care (Signed)
  Problem: Group Participation Goal: STG - Patient will engage in groups without prompting or encouragement from LRT x3 group sessions within 5 recreation therapy group sessions Description: STG - Patient will engage in groups without prompting or encouragement from LRT x3 group sessions within 5 recreation therapy group sessions 03/31/2020 1153 by Ernest Haber, LRT Outcome: Not Applicable [id number] by Ernest Haber, LRT Outcome: Not Met (add Reason) Note: Patient did not attend any groups.

## 2020-12-09 ENCOUNTER — Ambulatory Visit: Payer: Self-pay

## 2020-12-29 ENCOUNTER — Emergency Department: Payer: Medicaid Other

## 2020-12-29 ENCOUNTER — Other Ambulatory Visit: Payer: Self-pay

## 2020-12-29 ENCOUNTER — Encounter: Payer: Self-pay | Admitting: Emergency Medicine

## 2020-12-29 ENCOUNTER — Emergency Department
Admission: EM | Admit: 2020-12-29 | Discharge: 2020-12-29 | Disposition: A | Discharge status: Home / Self Care | Payer: Medicaid Other | Attending: Emergency Medicine | Admitting: Emergency Medicine

## 2020-12-29 DIAGNOSIS — R059 Cough, unspecified: Secondary | ICD-10-CM | POA: Insufficient documentation

## 2020-12-29 DIAGNOSIS — N390 Urinary tract infection, site not specified: Secondary | ICD-10-CM | POA: Diagnosis not present

## 2020-12-29 DIAGNOSIS — Z20822 Contact with and (suspected) exposure to covid-19: Secondary | ICD-10-CM | POA: Diagnosis not present

## 2020-12-29 DIAGNOSIS — R509 Fever, unspecified: Secondary | ICD-10-CM | POA: Diagnosis present

## 2020-12-29 DIAGNOSIS — R112 Nausea with vomiting, unspecified: Secondary | ICD-10-CM | POA: Insufficient documentation

## 2020-12-29 LAB — COMPREHENSIVE METABOLIC PANEL
ALT: 28 U/L (ref 0–44)
AST: 20 U/L (ref 15–41)
Albumin: 4.3 g/dL (ref 3.5–5.0)
Alkaline Phosphatase: 90 U/L (ref 38–126)
Anion gap: 10 (ref 5–15)
BUN: 7 mg/dL (ref 6–20)
CO2: 20 mmol/L — ABNORMAL LOW (ref 22–32)
Calcium: 8.6 mg/dL — ABNORMAL LOW (ref 8.9–10.3)
Chloride: 104 mmol/L (ref 98–111)
Creatinine, Ser: 0.77 mg/dL (ref 0.44–1.00)
GFR, Estimated: >60 mL/min (ref >60)
Glucose, Bld: 117 mg/dL — ABNORMAL HIGH (ref 70–99)
Potassium: 3.6 mmol/L (ref 3.5–5.1)
Sodium: 134 mmol/L — ABNORMAL LOW (ref 135–145)
Total Bilirubin: 1.4 mg/dL — ABNORMAL HIGH (ref 0.3–1.2)
Total Protein: 7.6 g/dL (ref 6.5–8.1)

## 2020-12-29 LAB — CBC
HCT: 40.8 % (ref 36.0–46.0)
Hemoglobin: 14.4 g/dL (ref 12.0–15.0)
MCH: 32 pg (ref 26.0–34.0)
MCHC: 35.3 g/dL (ref 30.0–36.0)
MCV: 90.7 fL (ref 80.0–100.0)
Platelets: 260 10*3/uL (ref 150–400)
RBC: 4.5 MIL/uL (ref 3.87–5.11)
RDW: 12.3 % (ref 11.5–15.5)
WBC: 22.3 10*3/uL — ABNORMAL HIGH (ref 4.0–10.5)
nRBC: 0 % (ref 0.0–0.2)

## 2020-12-29 LAB — LIPASE, BLOOD: Lipase: 25 U/L (ref 11–51)

## 2020-12-29 LAB — URINALYSIS, COMPLETE (UACMP) WITH MICROSCOPIC
Bilirubin Urine: NEGATIVE
Glucose, UA: NEGATIVE mg/dL
Ketones, ur: 80 mg/dL — AB
Nitrite: POSITIVE — AB
Protein, ur: 30 mg/dL — AB
Specific Gravity, Urine: 1.02 (ref 1.005–1.030)
pH: 5 (ref 5.0–8.0)

## 2020-12-29 LAB — PREGNANCY, URINE: Preg Test, Ur: NEGATIVE

## 2020-12-29 LAB — SARS CORONAVIRUS 2 (TAT 6-24 HRS): SARS Coronavirus 2: NEGATIVE

## 2020-12-29 MED ORDER — ACETAMINOPHEN 325 MG PO TABS
650.0000 mg | ORAL_TABLET | Freq: Once | ORAL | Status: AC | PRN
  Administered 2020-12-29: 650 mg via ORAL
  Filled 2020-12-29: qty 2

## 2020-12-29 MED ORDER — ONDANSETRON 4 MG PO TBDP: 4.0000 mg | ORAL_TABLET | Freq: Three times a day (TID) | ORAL | 0 refills | Status: DC | PRN

## 2020-12-29 MED ORDER — CEPHALEXIN 500 MG PO CAPS: 500.0000 mg | ORAL_CAPSULE | Freq: Two times a day (BID) | ORAL | 0 refills | Status: AC

## 2020-12-29 MED ORDER — SODIUM CHLORIDE 0.9 % IV SOLN
1.0000 g | Freq: Once | INTRAVENOUS | Status: AC
  Administered 2020-12-29: 1 g via INTRAVENOUS
  Filled 2020-12-29: qty 10

## 2020-12-29 MED ORDER — LACTATED RINGERS IV BOLUS
1000.0000 mL | Freq: Once | INTRAVENOUS | Status: AC
  Administered 2020-12-29: 1000 mL via INTRAVENOUS

## 2020-12-29 MED ORDER — IBUPROFEN 400 MG PO TABS
400.0000 mg | ORAL_TABLET | Freq: Once | ORAL | Status: AC
  Administered 2020-12-29: 400 mg via ORAL
  Filled 2020-12-29: qty 1

## 2020-12-29 NOTE — ED Triage Notes (Signed)
Pt to ED via POV with c/o fever that started yesterday, HA x several days and emesis. Pt with noted fever on arrival to ED. Pt states several episodes of emesis throughout the night.

## 2020-12-29 NOTE — ED Provider Notes (Signed)
[facility] Emergency Department Provider Note   ____________________________________________   Event Date/Time   First MD Initiated Contact with Patient 12/29/20 1140     (approximate)  I have reviewed the triage vital signs and the nursing notes.   HISTORY  Chief Complaint Fever and Emesis    HPI Madison Boyer is a 20 y.o. female with past medical history of schizoaffective disorder who presents to the ED complaining of nausea and vomiting.  Patient reports that she started having chills while at work yesterday, has been nauseous for the past 24 to 48 hours with multiple episodes of vomiting.  She denies any associated abdominal pain and has not had any diarrhea, also denies any dysuria or hematuria.  She has not checked her temperature at home but is concerned she could have had a fever.  She denies any vaginal bleeding or discharge and her LMP was approximately 3 weeks ago.  She has had a mild cough but denies any chest pain or shortness of breath.        History reviewed. No pertinent past medical history.  Patient Active Problem List   Diagnosis Date Noted  . Acute psychosis (HCC) 03/25/2020  . Cannabis abuse 03/25/2020  . Schizoaffective disorder (HCC) 02/15/2020  . Dysmenorrhea     Past Surgical History:  Procedure Laterality Date  . APPENDECTOMY      Prior to Admission medications   Medication Sig Start Date End Date Taking? Authorizing Provider  cephALEXin (KEFLEX) 500 MG capsule Take 1 capsule (500 mg total) by mouth 2 (two) times daily for 7 days. 12/29/20 01/05/21 Yes Chesley Noon, MD  ondansetron (ZOFRAN ODT) 4 MG disintegrating tablet Take 1 tablet (4 mg total) by mouth every 8 (eight) hours as needed for nausea or vomiting. 12/29/20  Yes Chesley Noon, MD  benztropine (COGENTIN) 0.5 MG tablet Take 1 tablet (0.5 mg total) by mouth 2 (two) times daily. 03/31/20   Clapacs, Jackquline Denmark, MD  docusate sodium (COLACE) 100 MG capsule  Take 1 capsule (100 mg total) by mouth 2 (two) times daily. 03/31/20   Clapacs, Jackquline Denmark, MD  haloperidol (HALDOL) 10 MG tablet Take 1 tablet (10 mg total) by mouth at bedtime. 03/31/20   Clapacs, Jackquline Denmark, MD  haloperidol decanoate (HALDOL DECANOATE) 100 MG/ML injection Inject 1 mL (100 mg total) into the muscle every 30 (thirty) days. Next dose due approximately April 24, 2020 04/24/20   Clapacs, Jackquline Denmark, MD  traZODone (DESYREL) 50 MG tablet Take 1 tablet (50 mg total) by mouth at bedtime as needed for sleep. 03/31/20   Clapacs, Jackquline Denmark, MD    Allergies Patient has no known allergies.  Family History  Problem Relation Age of Onset  . Healthy Mother   . Healthy Father   . Healthy Maternal Grandmother   . Diabetes Maternal Grandfather   . Healthy Paternal Grandmother   . Healthy Paternal Grandfather     Social History Social History   Tobacco Use  . Smoking status: Never Smoker  . Smokeless tobacco: Never Used  Vaping Use  . Vaping Use: Some days  Substance Use Topics  . Alcohol use: Yes    Comment: occassionally  . Drug use: Yes    Types: Marijuana    Review of Systems  Constitutional: Positive for subjective fever/chills Eyes: No visual changes. ENT: No sore throat. Cardiovascular: Denies chest pain. Respiratory: Denies shortness of breath.  Positive for cough. Gastrointestinal: No abdominal pain.  Positive for nausea  and vomiting.  No diarrhea.  No constipation. Genitourinary: Negative for dysuria. Musculoskeletal: Negative for back pain. Skin: Negative for rash. Neurological: Negative for headaches, focal weakness or numbness.  ____________________________________________   PHYSICAL EXAM:  VITAL SIGNS: ED Triage Vitals  Enc Vitals Group     BP 12/29/20 0913 135/76     Pulse Rate 12/29/20 0913 (!) 133     Resp 12/29/20 0913 20     Temp 12/29/20 0913 (!) 100.4 F (38 C)     Temp Source 12/29/20 0913 Oral     SpO2 12/29/20 0913 96 %     Weight 12/29/20 0913 200 lb  (90.7 kg)     Height 12/29/20 0913 5\' 2"  (1.575 m)     Head Circumference --      Peak Flow --      Pain Score 12/29/20 0913 7     Pain Loc --      Pain Edu? --      Excl. in GC? --     Constitutional: Alert and oriented. Eyes: Conjunctivae are normal. Head: Atraumatic. Nose: No congestion/rhinnorhea. Mouth/Throat: Mucous membranes are moist. Neck: Normal ROM Cardiovascular: Tachycardic, regular rhythm. Grossly normal heart sounds. Respiratory: Normal respiratory effort.  No retractions. Lungs CTAB. Gastrointestinal: Soft and nontender.  No CVA tenderness bilaterally.  No distention. Genitourinary: deferred Musculoskeletal: No lower extremity tenderness nor edema. Neurologic:  Normal speech and language. No gross focal neurologic deficits are appreciated. Skin:  Skin is warm, dry and intact. No rash noted. Psychiatric: Mood and affect are normal. Speech and behavior are normal.  ____________________________________________   LABS (all labs ordered are listed, but only abnormal results are displayed)  Labs Reviewed  COMPREHENSIVE METABOLIC PANEL - Abnormal; Notable for the following components:      Result Value   Sodium 134 (*)    CO2 20 (*)    Glucose, Bld 117 (*)    Calcium 8.6 (*)    Total Bilirubin 1.4 (*)    All other components within normal limits  CBC - Abnormal; Notable for the following components:   WBC 22.3 (*)    All other components within normal limits  URINALYSIS, COMPLETE (UACMP) WITH MICROSCOPIC - Abnormal; Notable for the following components:   Color, Urine AMBER (*)    APPearance CLOUDY (*)    Hgb urine dipstick MODERATE (*)    Ketones, ur 80 (*)    Protein, ur 30 (*)    Nitrite POSITIVE (*)    Leukocytes,Ua SMALL (*)    Bacteria, UA MANY (*)    All other components within normal limits  URINE CULTURE  SARS CORONAVIRUS 2 (TAT 6-24 HRS)  LIPASE, BLOOD  PREGNANCY, URINE    PROCEDURES  Procedure(s) performed (including Critical  Care):  Procedures   ____________________________________________   INITIAL IMPRESSION / ASSESSMENT AND PLAN / ED COURSE       20 year old female with past medical history of schizoaffective disorder who presents to the ED with 24 to 48 hours of nausea and vomiting with subjective fevers and chills starting yesterday.  She is febrile and tachycardic here, but is overall very well-appearing and I doubt sepsis.  She was given Tylenol for fever and we will hydrate with IV fluids, labs show leukocytosis but are otherwise unremarkable.  Pregnancy testing is negative and UA appears consistent with infection given positive nitrites.  Given minimal pain at this time, low suspicion for kidney stone.  Chest x-ray reviewed by me and shows no infiltrate, edema, or  effusion.  Testing for COVID-19 is pending.  It does appear that infectious symptoms are due to UTI and we will treat with Rocephin, send urine for culture.  Patient feeling better following IV fluids and tachycardia has now resolved.  She is tolerating p.o. without difficulty and continues to deny abdominal pain.  She is appropriate for discharge home with PCP follow-up, will be prescribed Keflex and Zofran.  She was counseled to return to the ED for new worsening symptoms, patient agrees with plan.      ____________________________________________   FINAL CLINICAL IMPRESSION(S) / ED DIAGNOSES  Final diagnoses:  Lower urinary tract infectious disease  Non-intractable vomiting with nausea, unspecified vomiting type     ED Discharge Orders         Ordered    cephALEXin (KEFLEX) 500 MG capsule  2 times daily        12/29/20 1416    ondansetron (ZOFRAN ODT) 4 MG disintegrating tablet  Every 8 hours PRN        12/29/20 1416           Note:  This document was prepared using Dragon voice recognition software and may include unintentional dictation errors.   Chesley Noon, MD 12/29/20 [phone]

## 2020-12-31 LAB — URINE CULTURE: Culture: >=100000 — AB

## 2021-01-22 ENCOUNTER — Ambulatory Visit (LOCAL_COMMUNITY_HEALTH_CENTER): Payer: Self-pay

## 2021-01-22 ENCOUNTER — Other Ambulatory Visit: Payer: Self-pay

## 2021-01-22 DIAGNOSIS — Z111 Encounter for screening for respiratory tuberculosis: Secondary | ICD-10-CM

## 2021-01-25 ENCOUNTER — Other Ambulatory Visit: Payer: Self-pay

## 2021-01-25 ENCOUNTER — Ambulatory Visit (LOCAL_COMMUNITY_HEALTH_CENTER): Payer: Medicaid Other

## 2021-01-25 DIAGNOSIS — Z111 Encounter for screening for respiratory tuberculosis: Secondary | ICD-10-CM

## 2021-01-25 LAB — TB SKIN TEST
Induration: 0 mm
TB Skin Test: NEGATIVE

## 2021-02-12 ENCOUNTER — Ambulatory Visit (INDEPENDENT_AMBULATORY_CARE_PROVIDER_SITE_OTHER): Payer: Medicaid Other | Admitting: Certified Nurse Midwife

## 2021-02-12 ENCOUNTER — Encounter: Payer: Self-pay | Admitting: Certified Nurse Midwife

## 2021-02-12 ENCOUNTER — Other Ambulatory Visit: Payer: Self-pay

## 2021-02-12 VITALS — BP 113/76 | HR 87 | Resp 16 | Ht 62.0 in | Wt 198.6 lb

## 2021-02-12 DIAGNOSIS — R350 Frequency of micturition: Secondary | ICD-10-CM | POA: Diagnosis not present

## 2021-02-12 DIAGNOSIS — R102 Pelvic and perineal pain: Secondary | ICD-10-CM

## 2021-02-12 DIAGNOSIS — R300 Dysuria: Secondary | ICD-10-CM

## 2021-02-12 DIAGNOSIS — Z30019 Encounter for initial prescription of contraceptives, unspecified: Secondary | ICD-10-CM

## 2021-02-12 DIAGNOSIS — R399 Unspecified symptoms and signs involving the genitourinary system: Secondary | ICD-10-CM | POA: Diagnosis not present

## 2021-02-12 DIAGNOSIS — R3915 Urgency of urination: Secondary | ICD-10-CM

## 2021-02-12 LAB — POCT URINALYSIS DIPSTICK
Bilirubin, UA: NEGATIVE
Blood, UA: POSITIVE
Glucose, UA: NEGATIVE
Ketones, UA: NEGATIVE
Nitrite, UA: NEGATIVE
Protein, UA: NEGATIVE
Spec Grav, UA: 1.015 (ref 1.010–1.025)
Urobilinogen, UA: 0.2 E.U./dL
pH, UA: 6.5 (ref 5.0–8.0)

## 2021-02-12 LAB — POCT URINE PREGNANCY: Preg Test, Ur: NEGATIVE

## 2021-02-12 MED ORDER — NITROFURANTOIN MONOHYD MACRO 100 MG PO CAPS: 100.0000 mg | ORAL_CAPSULE | Freq: Two times a day (BID) | ORAL | 0 refills | Status: DC

## 2021-02-12 NOTE — Patient Instructions (Signed)
Urinary Tract Infection, Adult A urinary tract infection (UTI) is an infection of any part of the urinary tract. The urinary tract includes:  The kidneys.  The ureters.  The bladder.  The urethra. These organs make, store, and get rid of pee (urine) in the body. What are the causes? This infection is caused by germs (bacteria) in your genital area. These germs grow and cause swelling (inflammation) of your urinary tract. What increases the risk? The following factors may make you more likely to develop this condition:  Using a small, thin tube (catheter) to drain pee.  Not being able to control when you pee or poop (incontinence).  Being female. If you are female, these things can increase the risk: ? Using these methods to prevent pregnancy:  A medicine that kills sperm (spermicide).  A device that blocks sperm (diaphragm). ? Having low levels of a female hormone (estrogen). ? Being pregnant. You are more likely to develop this condition if:  You have genes that add to your risk.  You are sexually active.  You take antibiotic medicines.  You have trouble peeing because of: ? A prostate that is bigger than normal, if you are female. ? A blockage in the part of your body that drains pee from the bladder. ? A kidney stone. ? A nerve condition that affects your bladder. ? Not getting enough to drink. ? Not peeing often enough.  You have other conditions, such as: ? Diabetes. ? A weak disease-fighting system (immune system). ? Sickle cell disease. ? Gout. ? Injury of the spine. What are the signs or symptoms? Symptoms of this condition include:  Needing to pee right away.  Peeing small amounts often.  Pain or burning when peeing.  Blood in the pee.  Pee that smells bad or not like normal.  Trouble peeing.  Pee that is cloudy.  Fluid coming from the vagina, if you are female.  Pain in the belly or lower back. Other symptoms include:  Vomiting.  Not  feeling hungry.  Feeling mixed up (confused). This may be the first symptom in older adults.  Being tired and grouchy (irritable).  A fever.  Watery poop (diarrhea). How is this treated?  Taking antibiotic medicine.  Taking other medicines.  Drinking enough water. In some cases, you may need to see a specialist. Follow these instructions at home: Medicines  Take over-the-counter and prescription medicines only as told by your doctor.  If you were prescribed an antibiotic medicine, take it as told by your doctor. Do not stop taking it even if you start to feel better. General instructions  Make sure you: ? Pee until your bladder is empty. ? Do not hold pee for a long time. ? Empty your bladder after sex. ? Wipe from front to back after peeing or pooping if you are a female. Use each tissue one time when you wipe.  Drink enough fluid to keep your pee pale yellow.  Keep all follow-up visits.   Contact a doctor if:  You do not get better after 1-2 days.  Your symptoms go away and then come back. Get help right away if:  You have very bad back pain.  You have very bad pain in your lower belly.  You have a fever.  You have chills.  You feeling like you will vomit or you vomit. Summary  A urinary tract infection (UTI) is an infection of any part of the urinary tract.  This condition is caused by   germs in your genital area.  There are many risk factors for a UTI.  Treatment includes antibiotic medicines.  Drink enough fluid to keep your pee pale yellow. This information is not intended to replace advice given to you by your health care provider. Make sure you discuss any questions you have with your health care provider. Document Revised: 05/01/2020 Document Reviewed: 05/01/2020 Elsevier Patient Education  2021 Elsevier Inc.   Nitrofurantoin tablets or capsules What is this medicine? NITROFURANTOIN (nye troe fyoor AN toyn) is an antibiotic. It is used to  treat urinary tract infections. This medicine may be used for other purposes; ask your health care provider or pharmacist if you have questions. COMMON BRAND NAME(S): Macrobid, Macrodantin, Urotoin What should I tell my health care provider before I take this medicine? They need to know if you have any of these conditions:  anemia  diabetes  glucose-6-phosphate dehydrogenase deficiency  kidney disease  liver disease  lung disease  other chronic illness  an unusual or allergic reaction to nitrofurantoin, other antibiotics, other medicines, foods, dyes or preservatives  pregnant or trying to get pregnant  breast-feeding How should I use this medicine? Take this medicine by mouth with a glass of water. Follow the directions on the prescription label. Take this medicine with food or milk. Take your doses at regular intervals. Do not take your medicine more often than directed. Do not stop taking except on your doctor's advice. Talk to your pediatrician regarding the use of this medicine in children. While this drug may be prescribed for selected conditions, precautions do apply. Overdosage: If you think you have taken too much of this medicine contact a poison control center or emergency room at once. NOTE: This medicine is only for you. Do not share this medicine with others. What if I miss a dose? If you miss a dose, take it as soon as you can. If it is almost time for your next dose, take only that dose. Do not take double or extra doses. What may interact with this medicine?  antacids containing magnesium trisilicate  probenecid  quinolone antibiotics like ciprofloxacin, lomefloxacin, norfloxacin and ofloxacin  sulfinpyrazone This list may not describe all possible interactions. Give your health care provider a list of all the medicines, herbs, non-prescription drugs, or dietary supplements you use. Also tell them if you smoke, drink alcohol, or use illegal drugs. Some items  may interact with your medicine. What should I watch for while using this medicine? Tell your doctor or health care professional if your symptoms do not improve or if you get new symptoms. Drink several glasses of water a day. If you are taking this medicine for a long time, visit your doctor for regular checks on your progress. If you are diabetic, you may get a false positive result for sugar in your urine with certain brands of urine tests. Check with your doctor. What side effects may I notice from receiving this medicine? Side effects that you should report to your doctor or health care professional as soon as possible:  allergic reactions like skin rash or hives, swelling of the face, lips, or tongue  chest pain  cough  difficulty breathing  dizziness, drowsiness  fever or infection  joint aches or pains  pale or blue-tinted skin  redness, blistering, peeling or loosening of the skin, including inside the mouth  tingling, burning, pain, or numbness in hands or feet  unusual bleeding or bruising  unusually weak or tired  yellowing of  eyes or skin Side effects that usually do not require medical attention (report to your doctor or health care professional if they continue or are bothersome):  dark urine  diarrhea  headache  loss of appetite  nausea or vomiting  temporary hair loss This list may not describe all possible side effects. Call your doctor for medical advice about side effects. You may report side effects to FDA at 1-800-FDA-1088. Where should I keep my medicine? Keep out of the reach of children. Store at room temperature between 15 and 30 degrees C (59 and 86 degrees F). Protect from light. Throw away any unused medicine after the expiration date. NOTE: This sheet is a summary. It may not cover all possible information. If you have questions about this medicine, talk to your doctor, pharmacist, or health care provider.  2021 Elsevier/Gold Standard  (2008-04-09 15:56:47)

## 2021-02-12 NOTE — Progress Notes (Signed)
GYN ENCOUNTER NOTE  Subjective:       Madison Boyer is a 20 y.o. G0P0 female is here for gynecologic evaluation of the following issues:   1. Lower abdominal cramps, back pain, dark urine, urgency, dysuria, and frequency with urination for 2 weeks.  2. Patient would like to discuss birth control options other than pills; discontinued her sprintec after one (1) month of use.  Denies difficulty breathing or respiratory difficulty, chest pain, or leg pain/swelling.   Gynecologic History  Patient's last menstrual period was 01/13/2021 (exact date).   Contraception: condoms   Last Pap: N/A  Obstetric History  OB History  Gravida Para Term Preterm AB Living  0 0 0 0 0 0  SAB IAB Ectopic Multiple Live Births  0 0 0 0 0    History reviewed. No pertinent past medical history.  Past Surgical History:  Procedure Laterality Date  . APPENDECTOMY      Current Outpatient Medications on File Prior to Visit  Medication Sig Dispense Refill  . ascorbic acid (VITAMIN C) 100 MG tablet Take by mouth.     No current facility-administered medications on file prior to visit.    No Known Allergies  Social History   Socioeconomic History  . Marital status: Single    Spouse name: Not on file  . Number of children: Not on file  . Years of education: Not on file  . Highest education level: Not on file  Occupational History  . Not on file  Tobacco Use  . Smoking status: Never Smoker  . Smokeless tobacco: Never Used  Vaping Use  . Vaping Use: Some days  Substance and Sexual Activity  . Alcohol use: Yes    Comment: occassionally  . Drug use: Yes    Types: Marijuana  . Sexual activity: Yes    Birth control/protection: Condom  Other Topics Concern  . Not on file  Social History Narrative   ** Merged History Encounter **       Social Determinants of Health   Financial Resource Strain: Not on file  Food Insecurity: Not on file  Transportation Needs: Not on file   Physical Activity: Not on file  Stress: Not on file  Social Connections: Not on file  Intimate Partner Violence: Not on file    Family History  Problem Relation Age of Onset  . Healthy Mother   . Healthy Father   . Healthy Maternal Grandmother   . Diabetes Maternal Grandfather   . Healthy Paternal Grandmother   . Healthy Paternal Grandfather     The following portions of the patient's history were reviewed and updated as appropriate: allergies, current medications, past family history, past medical history, past social history, past surgical history and problem list.  Review of Systems  ROS- Negative other than what was reported above. Information obtained verbally from patient.  Objective:   BP 113/76   Pulse 87   Resp 16   Ht 5\' 2"  (1.575 m)   Wt 90.1 kg   LMP 01/13/2021 (Exact Date)   BMI 36.32 kg/m    CONSTITUTIONAL: Well-developed, well-nourished female in no acute distress.   NEUROLGIC: Alert and oriented to person, place, and time.   PSYCHIATRIC: Normal mood and affect. Normal behavior. Normal judgment and thought content.  GENITOURINARY: Negative CVA Tenderness  MUSCULOSKELETAL: Normal range of motion.       POCT Urinalysis Dipstick     Status: Abnormal   Collection Time: 02/12/21  9:41 AM  Result  Value Ref Range   Color, UA     Clarity, UA     Glucose, UA Negative Negative   Bilirubin, UA Negative    Ketones, UA Negative    Spec Grav, UA 1.015 1.010 - 1.025   Blood, UA Positive     Comment: Trace   pH, UA 6.5 5.0 - 8.0   Protein, UA Negative Negative   Urobilinogen, UA 0.2 0.2 or 1.0 E.U./dL   Nitrite, UA Negative    Leukocytes, UA Moderate (2+) (A) Negative   Appearance     Odor    POCT urine pregnancy     Status: Normal   Collection Time: 02/12/21  9:42 AM  Result Value Ref Range   Preg Test, Ur Negative Negative    Assessment:   1. Symptoms of urinary tract infection - POCT Urinalysis Dipstick - Macrobid  2. Encounter for initial  prescription of contraceptives, unspecified contraceptive - POCT urine pregnancy  3. Pelvic pain - POCT Urinalysis Dipstick - Macrobid 4. Frequency of urination - Macrobid  5. Urgency of urination - Macrobid   6. Dysuria - Macorbid   Plan:   Discussed options for contraception and answered questions, including barrier methods, injectable contraception, IUD placement.  Contraception: Decided to do more home research on birth control options, she wants to make sure what she chooses is discrete from her mother. Patient reassured that the options we discussed are not noticeable.   Patient encouraged to use condoms, spermicide, female condom until she decided on birth control option.  Rx: Macrobid, see orders  Labs: see orders  Reviewed red flag symptoms and when to call  RTC if symptoms worsen or do not improve.   Juliann Pares, Student-Mid[employer]ife [employer] 02/12/21 10:39 AM

## 2021-02-12 NOTE — Progress Notes (Signed)
I have seen, interviewed, and examined the patient in conjunction with the [employer] and affirm the diagnosis and management plan.   Gunnar Bulla, CNM [facility], [facility] 02/12/21 1:56 PM

## 2021-02-16 LAB — URINE CULTURE

## 2021-06-23 ENCOUNTER — Encounter: Payer: Medicaid Other | Admitting: Obstetrics and Gynecology

## 2021-07-14 ENCOUNTER — Emergency Department
Admission: EM | Admit: 2021-07-14 | Discharge: 2021-07-14 | Disposition: A | Discharge status: Home / Self Care | Payer: Medicaid Other | Attending: Emergency Medicine | Admitting: Emergency Medicine

## 2021-07-14 ENCOUNTER — Other Ambulatory Visit: Payer: Self-pay

## 2021-07-14 DIAGNOSIS — R300 Dysuria: Secondary | ICD-10-CM | POA: Diagnosis present

## 2021-07-14 DIAGNOSIS — N3001 Acute cystitis with hematuria: Secondary | ICD-10-CM | POA: Insufficient documentation

## 2021-07-14 LAB — URINALYSIS, COMPLETE (UACMP) WITH MICROSCOPIC
RBC / HPF: >50 RBC/hpf — ABNORMAL HIGH (ref 0–5)
Specific Gravity, Urine: 1.018 (ref 1.005–1.030)
WBC, UA: >50 WBC/hpf — ABNORMAL HIGH (ref 0–5)

## 2021-07-14 LAB — COMPREHENSIVE METABOLIC PANEL
ALT: 29 U/L (ref 0–44)
AST: 22 U/L (ref 15–41)
Albumin: 4.4 g/dL (ref 3.5–5.0)
Alkaline Phosphatase: 81 U/L (ref 38–126)
Anion gap: 8 (ref 5–15)
BUN: 10 mg/dL (ref 6–20)
CO2: 25 mmol/L (ref 22–32)
Calcium: 9.5 mg/dL (ref 8.9–10.3)
Chloride: 107 mmol/L (ref 98–111)
Creatinine, Ser: 0.62 mg/dL (ref 0.44–1.00)
GFR, Estimated: >60 mL/min (ref >60)
Glucose, Bld: 102 mg/dL — ABNORMAL HIGH (ref 70–99)
Potassium: 4.1 mmol/L (ref 3.5–5.1)
Sodium: 140 mmol/L (ref 135–145)
Total Bilirubin: 0.7 mg/dL (ref 0.3–1.2)
Total Protein: 7.8 g/dL (ref 6.5–8.1)

## 2021-07-14 LAB — CBC
HCT: 43.1 % (ref 36.0–46.0)
Hemoglobin: 15.1 g/dL — ABNORMAL HIGH (ref 12.0–15.0)
MCH: 31.8 pg (ref 26.0–34.0)
MCHC: 35 g/dL (ref 30.0–36.0)
MCV: 90.7 fL (ref 80.0–100.0)
Platelets: 324 10*3/uL (ref 150–400)
RBC: 4.75 MIL/uL (ref 3.87–5.11)
RDW: 12.3 % (ref 11.5–15.5)
WBC: 9.4 10*3/uL (ref 4.0–10.5)
nRBC: 0 % (ref 0.0–0.2)

## 2021-07-14 LAB — POC URINE PREG, ED: Preg Test, Ur: NEGATIVE

## 2021-07-14 LAB — LIPASE, BLOOD: Lipase: 33 U/L (ref 11–51)

## 2021-07-14 MED ORDER — CEPHALEXIN 500 MG PO CAPS: 500.0000 mg | ORAL_CAPSULE | Freq: Three times a day (TID) | ORAL | 0 refills | Status: AC

## 2021-07-14 MED ORDER — CEPHALEXIN 500 MG PO CAPS
500.0000 mg | ORAL_CAPSULE | Freq: Once | ORAL | Status: AC
  Administered 2021-07-14: 500 mg via ORAL
  Filled 2021-07-14: qty 1

## 2021-07-14 NOTE — ED Notes (Signed)
Pain with urination, abd is located lower abd at her suprapubic area denies vomiting or diarrhea

## 2021-07-14 NOTE — Discharge Instructions (Addendum)
Please take Tylenol and ibuprofen/Advil for your pain.  It is safe to take them together, or to alternate them every few hours.  Take up to 1000mg  of Tylenol at a time, up to 4 times per day.  Do not take more than 4000 mg of Tylenol in 24 hours.  For ibuprofen, take 400-600 mg, 4-5 times per day.  Use the Keflex antibiotic 3 times daily for the next 5 days to treat your UTI.  If you develop any further worsening symptoms despite these, please return to the ED.  Please finish all 15 pills, even if your symptoms are getting better.

## 2021-07-14 NOTE — ED Provider Notes (Signed)
[facility] Emergency Department Provider Note ____________________________________________   Event Date/Time   First MD Initiated Contact with Patient 07/14/21 1057     (approximate)  I have reviewed the triage vital signs and the nursing notes.  HISTORY  Chief Complaint Abdominal Pain and Dysuria   HPI Madison Boyer is a 20 y.o. femalewho presents to the ED for evaluation of dysuria and abd pain.   Chart review indicates obese patient with schizoaffective disorder. S/P appy.   Patient presents to the ED for evaluation of 1-2 days of dysuria in the setting of a week of intermittent abdominal pains.  She reports abdominal pains that are sharp in nature, lasting a matter of seconds-minutes, to her suprapubic abdomen when she awakes in the morning for the past 1 week.  She reports this resolved throughout the day and she has no other issues such as nausea, vomiting, diarrhea or dysuria until the past 1 day when she develops dysuria and urinary frequency.  Denies fever, flank pain, syncopal episodes, vaginal discharge or bleeding.  No past medical history on file.  Patient Active Problem List   Diagnosis Date Noted   Acute psychosis (HCC) 03/25/2020   Cannabis abuse 03/25/2020   Schizoaffective disorder (HCC) 02/15/2020   Dysmenorrhea     Past Surgical History:  Procedure Laterality Date   APPENDECTOMY      Prior to Admission medications   Medication Sig Start Date End Date Taking? Authorizing Provider  cephALEXin (KEFLEX) 500 MG capsule Take 1 capsule (500 mg total) by mouth 3 (three) times daily for 5 days. 07/14/21 07/19/21 Yes Delton Prairie, MD  ascorbic acid (VITAMIN C) 100 MG tablet Take by mouth.    [provider]  nitrofurantoin, macrocrystal-monohydrate, (MACROBID) 100 MG capsule Take 1 capsule (100 mg total) by mouth 2 (two) times daily. 02/12/21   Gunnar Bulla, CNM    Allergies Patient has no known  allergies.  Family History  Problem Relation Age of Onset   Healthy Mother    Healthy Father    Healthy Maternal Grandmother    Diabetes Maternal Grandfather    Healthy Paternal Grandmother    Healthy Paternal Grandfather     Social History Social History   Tobacco Use   Smoking status: Never   Smokeless tobacco: Never  Vaping Use   Vaping Use: Some days  Substance Use Topics   Alcohol use: Yes    Comment: occassionally   Drug use: Yes    Types: Marijuana    Review of Systems  Constitutional: No fever/chills Eyes: No visual changes. ENT: No sore throat. Cardiovascular: Denies chest pain. Respiratory: Denies shortness of breath. Gastrointestinal: Positive for lower abdominal pain. No nausea, no vomiting.  No diarrhea.  No constipation. Genitourinary: Positive for dysuria and urinary frequency. Musculoskeletal: Negative for back pain. Skin: Negative for rash. Neurological: Negative for headaches, focal weakness or numbness.  ____________________________________________   PHYSICAL EXAM:  VITAL SIGNS: Vitals:   07/14/21 1026  BP: 125/74  Pulse: 64  Resp: 16  Temp: 98.4 F (36.9 C)  SpO2: 97%     Constitutional: Alert and oriented. Well appearing and in no acute distress. Eyes: Conjunctivae are normal. PERRL. EOMI. Head: Atraumatic. Nose: No congestion/rhinnorhea. Mouth/Throat: Mucous membranes are moist.  Oropharynx non-erythematous. Neck: No stridor. No cervical spine tenderness to palpation. Cardiovascular: Normal rate, regular rhythm. Grossly normal heart sounds.  Good peripheral circulation. Respiratory: Normal respiratory effort.  No retractions. Lungs CTAB. Gastrointestinal: Soft , nondistended. No CVA  tenderness. Suprapubic tenderness without peritoneal features.  Abdomen is otherwise benign. Musculoskeletal: No lower extremity tenderness nor edema.  No joint effusions. No signs of acute trauma. Neurologic:  Normal speech and language. No gross  focal neurologic deficits are appreciated. No gait instability noted. Skin:  Skin is warm, dry and intact. No rash noted. Psychiatric: Mood and affect are normal. Speech and behavior are normal.  ____________________________________________   LABS (all labs ordered are listed, but only abnormal results are displayed)  Labs Reviewed  COMPREHENSIVE METABOLIC PANEL - Abnormal; Notable for the following components:      Result Value   Glucose, Bld 102 (*)    All other components within normal limits  CBC - Abnormal; Notable for the following components:   Hemoglobin 15.1 (*)    All other components within normal limits  URINALYSIS, COMPLETE (UACMP) WITH MICROSCOPIC - Abnormal; Notable for the following components:   Color, Urine RED (*)    APPearance CLOUDY (*)    Glucose, UA   (*)    Value: TEST NOT REPORTED DUE TO COLOR INTERFERENCE OF URINE PIGMENT   Hgb urine dipstick   (*)    Value: TEST NOT REPORTED DUE TO COLOR INTERFERENCE OF URINE PIGMENT   Bilirubin Urine   (*)    Value: TEST NOT REPORTED DUE TO COLOR INTERFERENCE OF URINE PIGMENT   Ketones, ur   (*)    Value: TEST NOT REPORTED DUE TO COLOR INTERFERENCE OF URINE PIGMENT   Protein, ur   (*)    Value: TEST NOT REPORTED DUE TO COLOR INTERFERENCE OF URINE PIGMENT   Nitrite   (*)    Value: TEST NOT REPORTED DUE TO COLOR INTERFERENCE OF URINE PIGMENT   Leukocytes,Ua   (*)    Value: TEST NOT REPORTED DUE TO COLOR INTERFERENCE OF URINE PIGMENT   RBC / HPF >50 (*)    WBC, UA >50 (*)    Bacteria, UA FEW (*)    All other components within normal limits  URINE CULTURE  LIPASE, BLOOD  POC URINE PREG, ED   ____________________________________________  12 Lead EKG   ____________________________________________  RADIOLOGY  ED MD interpretation:    Official radiology report(s): No results found.  ____________________________________________   PROCEDURES and INTERVENTIONS  Procedure(s) performed (including Critical  Care):  Procedures  Medications  cephALEXin (KEFLEX) capsule 500 mg (has no administration in time range)    ____________________________________________   MDM / ED COURSE   20 year old female presents to the ED with dysuria and abdominal pain with evidence of acute cystitis amenable to outpatient management.  No evidence of sepsis, pyelonephritis or PID.  Blood work is reassuring and patient is not pregnant.  Will start on Keflex, culture her urine and discharged her with return precautions.     ____________________________________________   FINAL CLINICAL IMPRESSION(S) / ED DIAGNOSES  Final diagnoses:  Acute cystitis with hematuria     ED Discharge Orders          Ordered    cephALEXin (KEFLEX) 500 MG capsule  3 times daily        07/14/21 1158             Mouhamad Teed   Note:  This document was prepared using Conservation officer, historic buildings and may include unintentional dictation errors.    Delton Prairie, MD 07/14/21 1159

## 2021-07-14 NOTE — ED Triage Notes (Signed)
Pt states that she has been having lower abd pain for the past week, states this am when she voided it burned

## 2021-07-15 LAB — URINE CULTURE: Culture: <10000 — AB

## 2021-08-05 ENCOUNTER — Encounter: Payer: Medicaid Other | Admitting: Obstetrics & Gynecology

## 2021-09-23 ENCOUNTER — Encounter: Payer: Medicaid Other | Admitting: Obstetrics & Gynecology

## 2021-09-24 IMAGING — CT CT HEAD W/O CM
3 series · 15 of 47 positions shown, 18 images · non-contrast
Comparison: None.

CLINICAL DATA: Delusions

EXAM:
CT HEAD WITHOUT CONTRAST
TECHNIQUE: Contiguous axial images were obtained from the base of the skull
through the vertex without intravenous contrast.

[Series 2: head wo · axial · 0.41mm/px · z∈[-72,+53]mm · 9 of 30 slices shown, 12 images]
[im 3/30  brain]
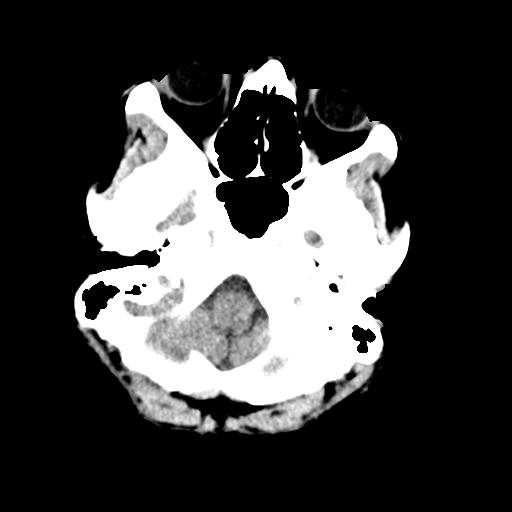
[im 3/30  bone]
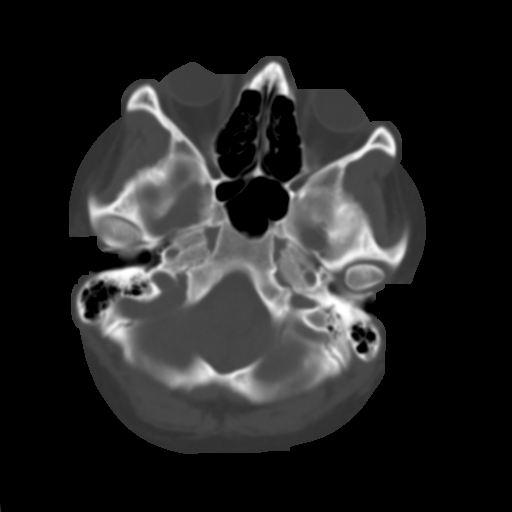
[im 6/30  brain]
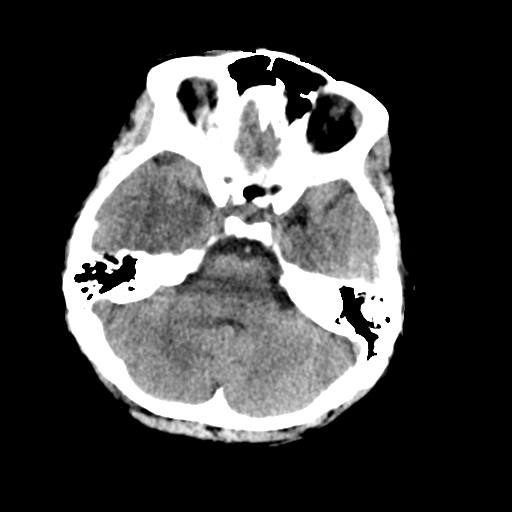
[im 9/30  brain]
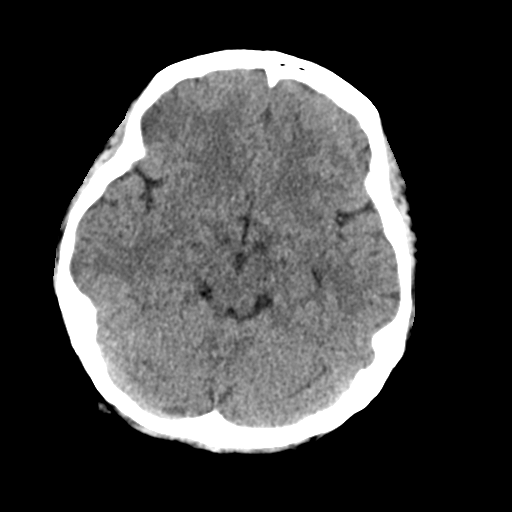
[im 12/30  brain]
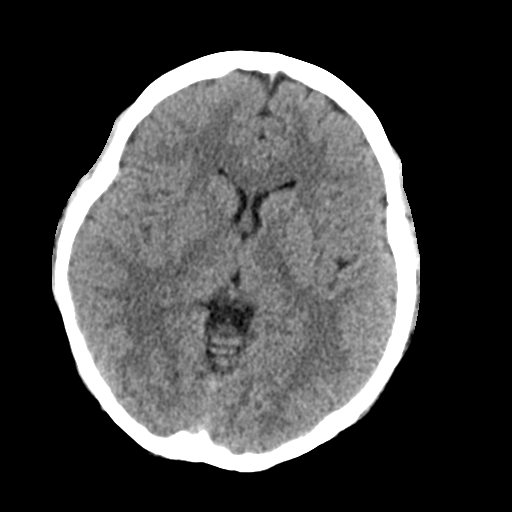
[im 16/30  brain]
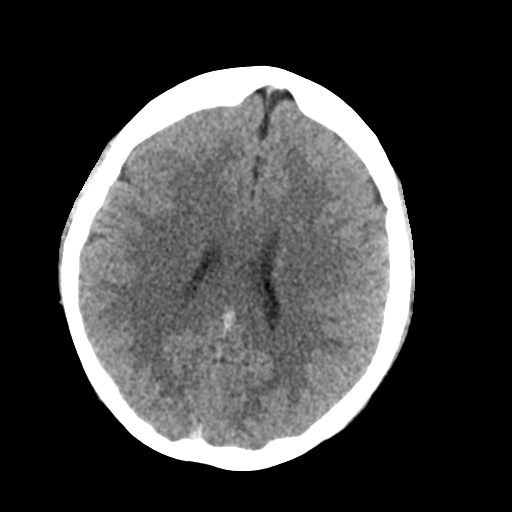
[im 16/30  bone]
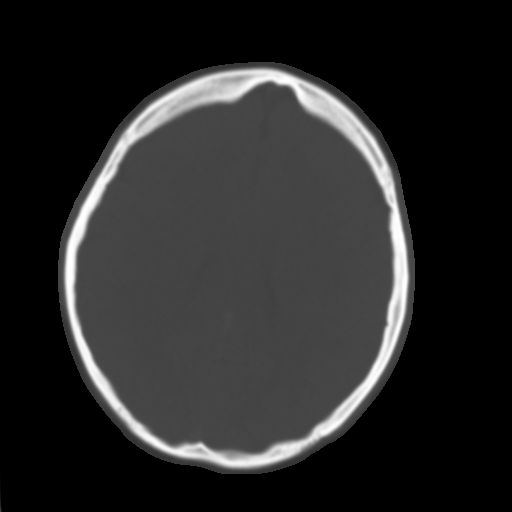
[im 19/30  brain]
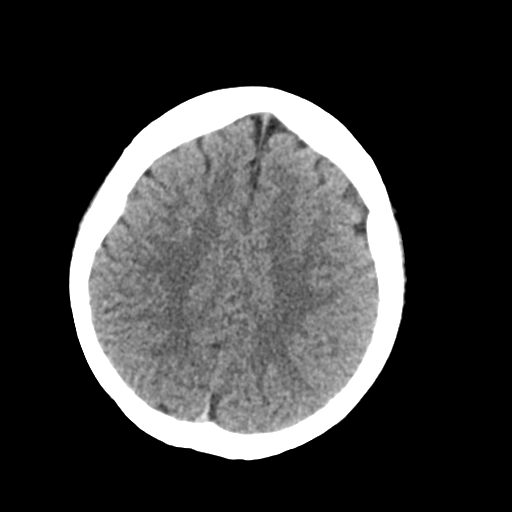
[im 22/30  brain]
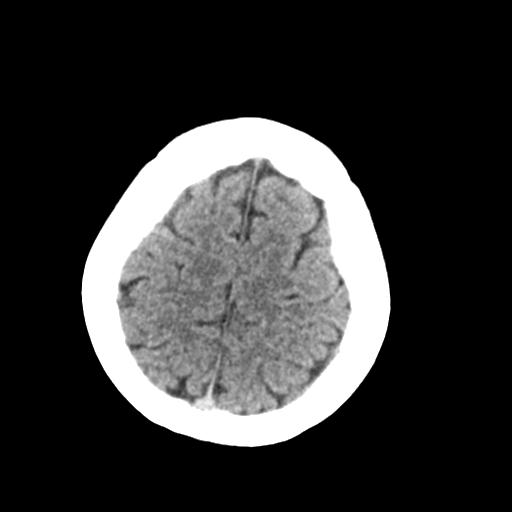
[im 25/30  brain]
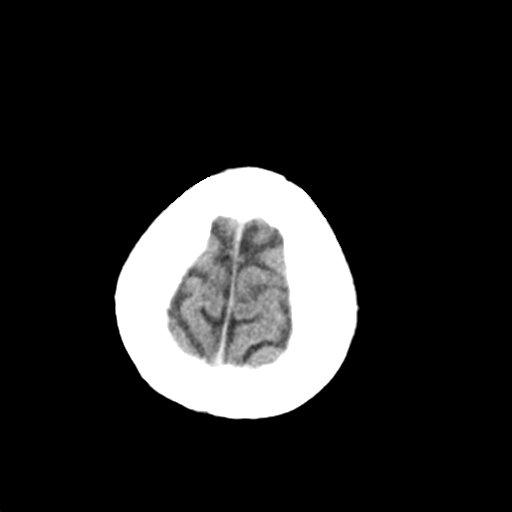
[im 28/30  brain]
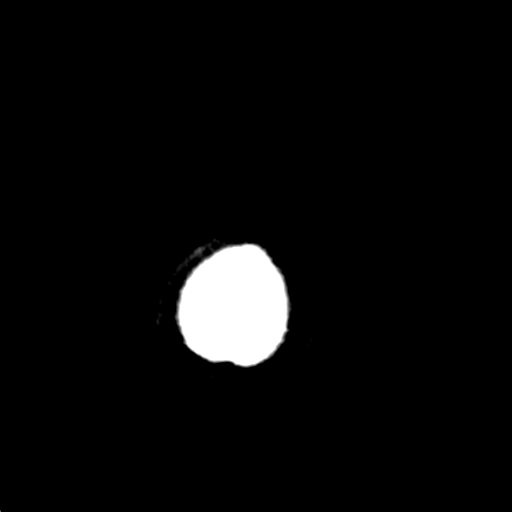
[im 28/30  bone]
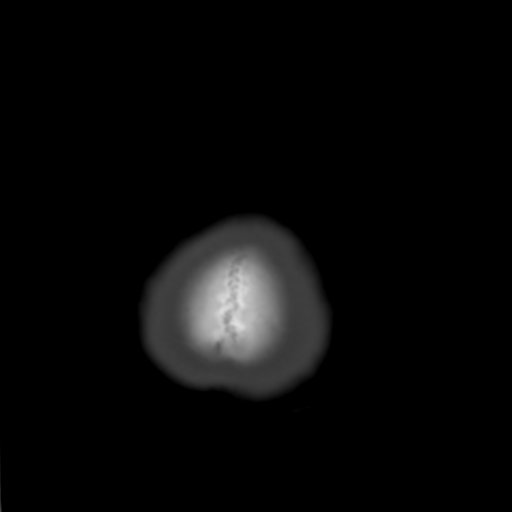

[Series 4: coronal soft tissue · coronal · 0.30mm/px · 3 of 60 slices shown]
[im 20/60  brain]
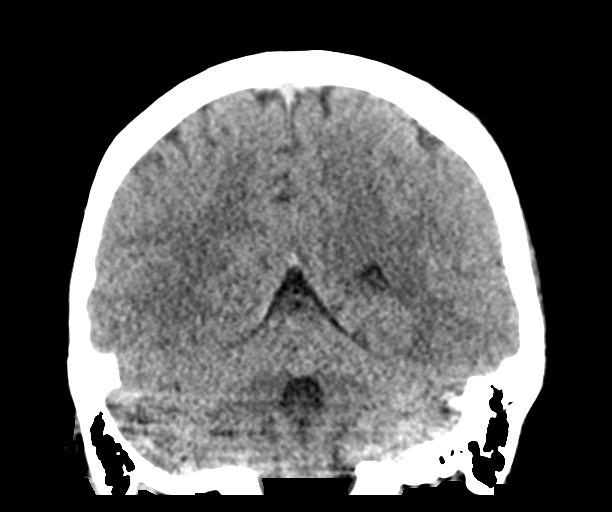
[im 27/60  brain]
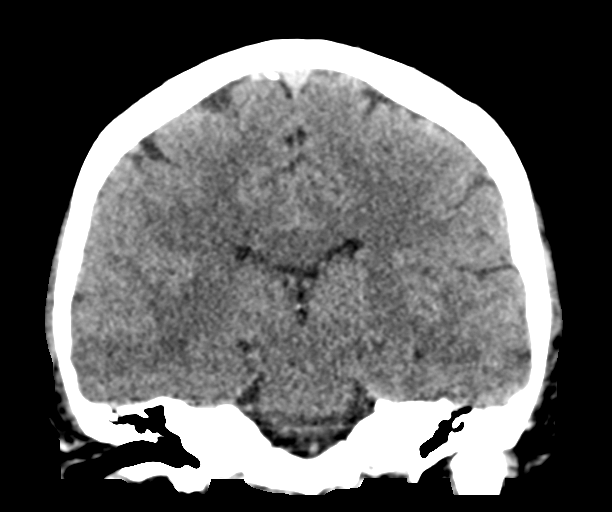
[im 33/60  brain]
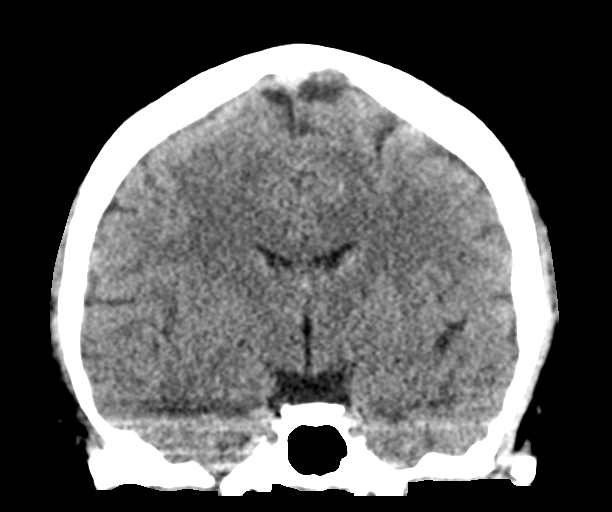

[Series 5: sagittal soft tissue · sagittal · 0.30mm/px · 3 of 54 slices shown]
[im 18/54  brain]
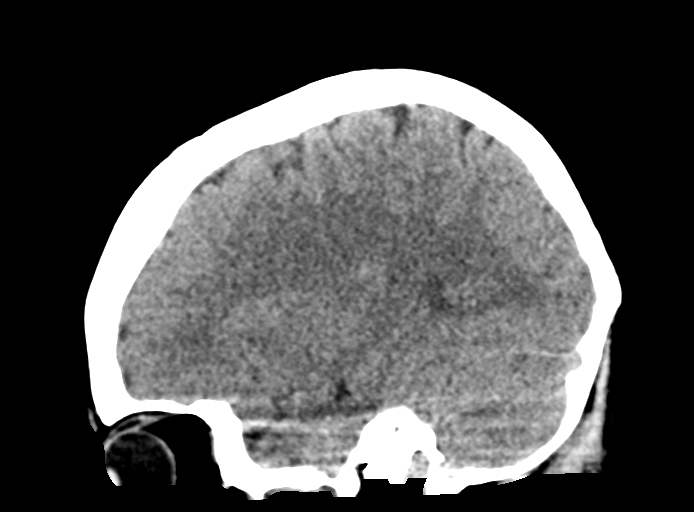
[im 27/54  brain]
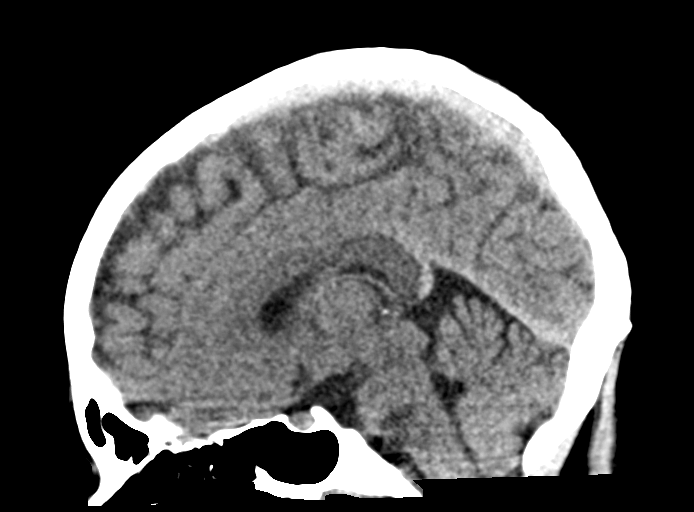
[im 36/54  brain]
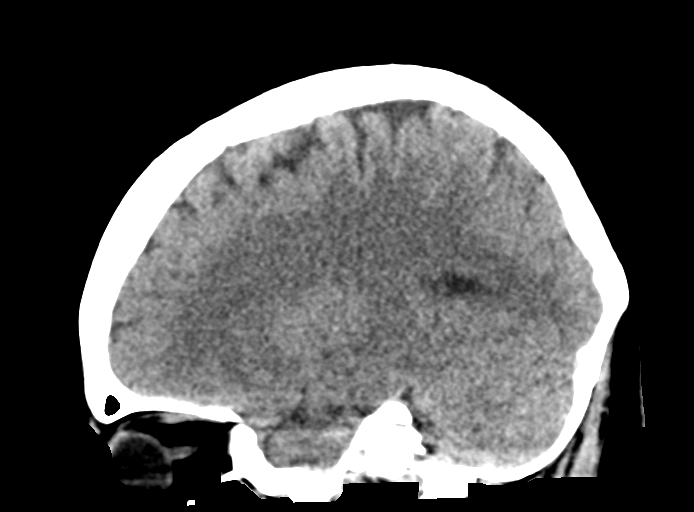

[15 of 47 positions shown; findings below may reference images not displayed]

FINDINGS: Brain: No evidence of acute territorial infarction, hemorrhage,
hydrocephalus,extra-axial collection or mass lesion/mass effect.
Normal gray-white differentiation. Ventricles are normal in size and
contour.

Vascular: No hyperdense vessel or unexpected calcification.

Skull: The skull is intact. No fracture or focal lesion identified.

Sinuses/Orbits: The visualized paranasal sinuses and mastoid air
cells are clear. The orbits and globes intact.

Other: None
IMPRESSION: No acute intracranial abnormality.

## 2022-03-01 ENCOUNTER — Telehealth: Payer: Self-pay

## 2022-03-01 NOTE — Telephone Encounter (Signed)
Left vm and sent mychart message to confirm 03/07/22 appointment-Toni 

## 2022-03-07 ENCOUNTER — Ambulatory Visit (INDEPENDENT_AMBULATORY_CARE_PROVIDER_SITE_OTHER): Payer: Medicaid Other | Admitting: Nurse Practitioner

## 2022-03-07 ENCOUNTER — Encounter: Payer: Self-pay | Admitting: Nurse Practitioner

## 2022-03-07 VITALS — BP 125/74 | HR 86 | Temp 98.9°F | Resp 16 | Ht 62.0 in | Wt 201.2 lb

## 2022-03-07 DIAGNOSIS — R7301 Impaired fasting glucose: Secondary | ICD-10-CM

## 2022-03-07 DIAGNOSIS — Z113 Encounter for screening for infections with a predominantly sexual mode of transmission: Secondary | ICD-10-CM

## 2022-03-07 DIAGNOSIS — F251 Schizoaffective disorder, depressive type: Secondary | ICD-10-CM

## 2022-03-07 DIAGNOSIS — D751 Secondary polycythemia: Secondary | ICD-10-CM | POA: Diagnosis not present

## 2022-03-07 DIAGNOSIS — Z3169 Encounter for other general counseling and advice on procreation: Secondary | ICD-10-CM

## 2022-03-07 DIAGNOSIS — F1210 Cannabis abuse, uncomplicated: Secondary | ICD-10-CM

## 2022-03-07 DIAGNOSIS — Z7689 Persons encountering health services in other specified circumstances: Secondary | ICD-10-CM

## 2022-03-07 DIAGNOSIS — Z789 Other specified health status: Secondary | ICD-10-CM

## 2022-03-07 NOTE — Progress Notes (Signed)
[facility] [place], [place] [address]  Internal MEDICINE  Office Visit Note  Patient Name: Madison Boyer  [id number]  [id number]  Date of Service: 03/07/2022   Complaints/HPI Pt is here for establishment of PCP. Chief Complaint  Patient presents with   New Patient (Initial Visit)    Pt wants physical with pap and STI testing, discuss conceiving    HPI Serena presents for a new patient visit to establish care. She is a well appearing 21 yo female with no significant medical conditions. Her only surgical history is an appendectomy. She does have family history of diabetes. She lives at home with her family. 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 Her diet is variable and for exercise she goes walking. She does not smoke cigarettes but she does vape. She drinks approximately 4 beers cans per week and denies any recreational drug use but then confirmed using marijuana. She was counseled on underage drinking although she will be 21 years old in July this year.  She does not have any pain.  She is requesting a pap smear, STD testing and wants to try to conceive.  ---informed patient she does not need a pap smear until she turns 21 years old.  ---discussed conception with patient. Her significant other is pushing her to get pregnant and she wants to do this to please him. Discussed pregnancy and the demands of labor, delivery and child-rearing as well as the financial aspect. She is establishing care with PCP coming from her pediatrician's office. She is young, still in school and mentioned other things she wants to do as well related to traveling.       Current Medication: Outpatient Encounter Medications as of 03/07/2022  Medication Sig   traZODone (DESYREL) 50 MG tablet Take by mouth. (Patient not taking: Reported on 03/20/2022)   [DISCONTINUED] risperiDONE (RISPERDAL) 2 MG tablet Take by mouth.  (Patient not taking: Reported on 03/20/2022)   [DISCONTINUED] ascorbic acid (VITAMIN C) 100 MG tablet Take by mouth.   [DISCONTINUED] nitrofurantoin, macrocrystal-monohydrate, (MACROBID) 100 MG capsule Take 1 capsule (100 mg total) by mouth 2 (two) times daily. (Patient not taking: Reported on 03/07/2022)   No facility-administered encounter medications on file as of 03/07/2022.    Surgical History: Past Surgical History:  Procedure Laterality Date   APPENDECTOMY      Medical History: Past Medical History:  Diagnosis Date   Schizophrenic disorder ([place]) 2021    Family History: Family History  Problem Relation Age of Onset   Healthy Mother    Healthy Father    Healthy Maternal Grandmother    Depression Maternal Grandfather    Diabetes Maternal Grandfather    Healthy Paternal Grandmother    Healthy Paternal Grandfather     Social History   Socioeconomic History   Marital status: Single    Spouse name: Not on file   Number of children: Not on file   Years of education: Not on file   Highest education level: Not on file  Occupational History   Not on file  Tobacco Use   Smoking status: Every Day    Types: E-cigarettes   Smokeless tobacco: Never  Vaping Use   Vaping Use: Some days  Substance and Sexual Activity   Alcohol use: Not Currently    Comment: occassionally   Drug use: Yes    Types: Marijuana   Sexual activity: Yes  Birth control/protection: Condom  Other Topics Concern   Not on file  Social History Narrative   ** Merged History Encounter **       Social Determinants of Health   Financial Resource Strain: Not on file  Food Insecurity: Not on file  Transportation Needs: Not on file  Physical Activity: Not on file  Stress: Not on file  Social Connections: Not on file  Intimate Partner Violence: Not on file     Review of Systems  Constitutional:  Negative for chills, fatigue and unexpected weight change.  HENT:  Negative for congestion,  rhinorrhea, sneezing and sore throat.   Eyes:  Negative for redness.  Respiratory:  Negative for cough, chest tightness and shortness of breath.   Cardiovascular:  Negative for chest pain and palpitations.  Gastrointestinal:  Negative for abdominal pain, constipation, diarrhea, nausea and vomiting.  Genitourinary:  Negative for dysuria and frequency.  Musculoskeletal:  Negative for arthralgias, back pain, joint swelling and neck pain.  Skin:  Negative for rash.  Neurological: Negative.  Negative for tremors and numbness.  Hematological:  Negative for adenopathy. Does not bruise/bleed easily.  Psychiatric/Behavioral:  Negative for behavioral problems (Depression), sleep disturbance and suicidal ideas. The patient is not nervous/anxious.     Vital Signs: BP 125/74   Pulse 86   Temp 98.9 F (37.2 C)   Resp 16   Ht $R'5\' 2"'FA$  (1.575 m)   Wt 201 lb 3.2 oz (91.3 kg)   SpO2 98%   BMI 36.80 kg/m    Physical Exam Vitals reviewed.  Constitutional:      General: She is not in acute distress.    Appearance: Normal appearance. She is obese. She is not ill-appearing.  HENT:     Head: Normocephalic and atraumatic.  Eyes:     Pupils: Pupils are equal, round, and reactive to light.  Cardiovascular:     Rate and Rhythm: Normal rate and regular rhythm.  Pulmonary:     Effort: Pulmonary effort is normal. No respiratory distress.  Neurological:     Mental Status: She is alert and oriented to person, place, and time.  Psychiatric:        Mood and Affect: Mood normal.        Behavior: Behavior normal.       Assessment/Plan: 1. Impaired fasting glucose Persistently elevated glucose on several prior CMPs. Additional labs ordered.  - Hemoglobin A1c - CMP14+EGFR - Lipid Profile  2. Erythrocytosis Additional labs ordered to evaluate erythrocytosis due to persistently elevated hemoglobin on past labs.  - CBC with Differential/Platelet - Iron, TIBC and Ferritin Panel - B12 and Folate  Panel  3. 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 Significant other is pushing for the patient to become pregnant. Patient wants to have children but seems hesitant. When discussed further, she is not ready per patient report. Pregnancy, labor, delivery and child-rearing discussed. Financial burden of having children as well as the time management between home, school and work also discussed. Patient decided that it would be better to wait and not have children right now. Encouraged patient to use contraception if possible but also to take a prenatal vitamin OTC just in case she were to become pregnant so the baby would have the best chance to grow and be healthy. Also discussed possible negative habits that could hurt a baby during pregnancy including the marijuana use, alcohol consumption and vaping. Encouraged patient to stop all of these things prior to  trying to conceive because they can hurt the baby.   4. Encounter for screening for infections with a predominantly sexual mode of transmission Labs ordered to rule out STDs, urine specimen sent for chlamydia, gonorrhea and trich.  - RPR - HepB+HepC+HIV Panel - Chlamydia/Gonococcus/Trichomonas, NAA  5. Schizoaffective disorder, depressive type ([place]) Not on any medication at this time. Does not have a psychiatrist right now. Declined referral to psychiatry for now and said she would think about it.   6. Cannabis abuse Counseled patient to be safe when using if she is going to use.   7. Encounter to establish care with new doctor Establishing with PCP for first time from aging out of pediatrician's care.   8. Alcohol consumption of one to four drinks per week Labs ordered to evaluate for anemia, vitamin deficiencies and liver function status.  - CBC with Differential/Platelet - Iron, TIBC and Ferritin Panel - B12 and Folate Panel - CMP14+EGFR    General Counseling: Katrice  verbalizes understanding of the findings of todays visit and agrees with plan of treatment. I have discussed any further diagnostic evaluation that may be needed or ordered today. We also reviewed her medications today. she has been encouraged to call the office with any questions or concerns that should arise related to todays visit.    Counseling:  [place] Controlled Substance Database was reviewed by me.  Orders Placed This Encounter  Procedures   Chlamydia/Gonococcus/Trichomonas, NAA   Chlamydia/Gonococcus/Trichomonas, NAA   RPR   HepB+HepC+HIV Panel   CBC with Differential/Platelet   Hemoglobin A1c   Iron, TIBC and Ferritin Panel   B12 and Folate Panel   CMP14+EGFR   Lipid Profile    No orders of the defined types were placed in this encounter.   Return in about 2 months (around 05/07/2022) for CPE/PAP, Hiba Garry PCP pls schedule in early august. .  Time spent:30 Minutes Time spent with patient included reviewing progress notes, labs, imaging studies, and discussing plan for follow up.   [place] Controlled Substance Database was reviewed by me for overdose risk score (ORS)   This patient was seen by Jonetta Osgood, FNP-C in collaboration with Dr. Clayborn Bigness as a part of collaborative care agreement.    Nathan Moctezuma R. Valetta Fuller, MSN, FNP-C Internal Medicine

## 2022-03-14 LAB — HEPB+HEPC+HIV PANEL
HIV Screen 4th Generation wRfx: NONREACTIVE
Hep B C IgM: NEGATIVE
Hep B Core Total Ab: NEGATIVE
Hep B E Ab: NEGATIVE
Hep B E Ag: NEGATIVE
Hep B Surface Ab, Qual: NONREACTIVE
Hep C Virus Ab: NONREACTIVE
Hepatitis B Surface Ag: NEGATIVE

## 2022-03-14 LAB — CHLAMYDIA/GONOCOCCUS/TRICHOMONAS, NAA
Chlamydia by NAA: NEGATIVE
Gonococcus by NAA: NEGATIVE
Trich vag by NAA: NEGATIVE

## 2022-03-14 LAB — RPR: RPR Ser Ql: NONREACTIVE

## 2022-03-15 NOTE — Progress Notes (Signed)
Please call the patient and let her know her results her tests were negative for syphilis, hepatitis B, hepatitis C, HIV and her urine was negative for chlamydia, gonorrhea and trichomonas. Her blood test was also nonreactive for the hepatitis B surface antibody which is supposed to be reactive to show immunity.  She will need a hepatitis B vaccine booster which she can get at her local pharmacy or the health department.

## 2022-03-16 ENCOUNTER — Telehealth: Payer: Self-pay

## 2022-03-16 NOTE — Telephone Encounter (Signed)
Spoke to pt, provided results, explained she needs hep B vaccine booster

## 2022-03-16 NOTE — Telephone Encounter (Signed)
LMOM to review results ?

## 2022-03-16 NOTE — Telephone Encounter (Signed)
-----   Message from Sallyanne Kuster, NP sent at 03/15/2022  8:22 AM EDT ----- Please call the patient and let her know her results her tests were negative for syphilis, hepatitis B, hepatitis C, HIV and her urine was negative for chlamydia, gonorrhea and trichomonas. Her blood test was also nonreactive for the hepatitis B surface antibody which is supposed to be reactive to show immunity.  She will need a hepatitis B vaccine booster which she can get at her local pharmacy or the health department.

## 2022-03-16 NOTE — Telephone Encounter (Signed)
-----   Message from Alyssa Abernathy, NP sent at 03/15/2022  8:22 AM EDT ----- Please call the patient and let her know her results her tests were negative for syphilis, hepatitis B, hepatitis C, HIV and her urine was negative for chlamydia, gonorrhea and trichomonas. Her blood test was also nonreactive for the hepatitis B surface antibody which is supposed to be reactive to show immunity.  She will need a hepatitis B vaccine booster which she can get at her local pharmacy or the health department. 

## 2022-03-20 ENCOUNTER — Emergency Department (EMERGENCY_DEPARTMENT_HOSPITAL)
Admission: EM | Admit: 2022-03-20 | Discharge: 2022-03-21 | Disposition: A | Discharge status: Home / Self Care | Payer: No Typology Code available for payment source | Source: Home / Self Care | Attending: Emergency Medicine | Admitting: Emergency Medicine

## 2022-03-20 ENCOUNTER — Other Ambulatory Visit: Payer: Self-pay

## 2022-03-20 DIAGNOSIS — F259 Schizoaffective disorder, unspecified: Secondary | ICD-10-CM | POA: Diagnosis present

## 2022-03-20 DIAGNOSIS — F17290 Nicotine dependence, other tobacco product, uncomplicated: Secondary | ICD-10-CM | POA: Insufficient documentation

## 2022-03-20 DIAGNOSIS — Z20822 Contact with and (suspected) exposure to covid-19: Secondary | ICD-10-CM | POA: Insufficient documentation

## 2022-03-20 DIAGNOSIS — T50902A Poisoning by unspecified drugs, medicaments and biological substances, intentional self-harm, initial encounter: Secondary | ICD-10-CM

## 2022-03-20 DIAGNOSIS — T43212A Poisoning by selective serotonin and norepinephrine reuptake inhibitors, intentional self-harm, initial encounter: Secondary | ICD-10-CM | POA: Insufficient documentation

## 2022-03-20 DIAGNOSIS — F439 Reaction to severe stress, unspecified: Secondary | ICD-10-CM | POA: Insufficient documentation

## 2022-03-20 DIAGNOSIS — F23 Brief psychotic disorder: Secondary | ICD-10-CM | POA: Diagnosis present

## 2022-03-20 LAB — COMPREHENSIVE METABOLIC PANEL
ALT: 48 U/L — ABNORMAL HIGH (ref 0–44)
AST: 33 U/L (ref 15–41)
Albumin: 4.1 g/dL (ref 3.5–5.0)
Alkaline Phosphatase: 97 U/L (ref 38–126)
Anion gap: 9 (ref 5–15)
BUN: 12 mg/dL (ref 6–20)
CO2: 22 mmol/L (ref 22–32)
Calcium: 9 mg/dL (ref 8.9–10.3)
Chloride: 108 mmol/L (ref 98–111)
Creatinine, Ser: 0.63 mg/dL (ref 0.44–1.00)
GFR, Estimated: >60 mL/min (ref >60)
Glucose, Bld: 129 mg/dL — ABNORMAL HIGH (ref 70–99)
Potassium: 3.7 mmol/L (ref 3.5–5.1)
Sodium: 139 mmol/L (ref 135–145)
Total Bilirubin: 0.8 mg/dL (ref 0.3–1.2)
Total Protein: 7.8 g/dL (ref 6.5–8.1)

## 2022-03-20 LAB — URINE DRUG SCREEN, QUALITATIVE (ARMC ONLY)
Amphetamines, Ur Screen: NOT DETECTED
Barbiturates, Ur Screen: NOT DETECTED
Benzodiazepine, Ur Scrn: NOT DETECTED
Cannabinoid 50 Ng, Ur ~~LOC~~: POSITIVE — AB
Cocaine Metabolite,Ur ~~LOC~~: NOT DETECTED
MDMA (Ecstasy)Ur Screen: NOT DETECTED
Methadone Scn, Ur: NOT DETECTED
Opiate, Ur Screen: NOT DETECTED
Phencyclidine (PCP) Ur S: NOT DETECTED
Tricyclic, Ur Screen: NOT DETECTED

## 2022-03-20 LAB — CBC
HCT: 43.7 % (ref 36.0–46.0)
Hemoglobin: 14.6 g/dL (ref 12.0–15.0)
MCH: 30.7 pg (ref 26.0–34.0)
MCHC: 33.4 g/dL (ref 30.0–36.0)
MCV: 91.8 fL (ref 80.0–100.0)
Platelets: 368 10*3/uL (ref 150–400)
RBC: 4.76 MIL/uL (ref 3.87–5.11)
RDW: 12.5 % (ref 11.5–15.5)
WBC: 12.8 10*3/uL — ABNORMAL HIGH (ref 4.0–10.5)
nRBC: 0 % (ref 0.0–0.2)

## 2022-03-20 LAB — ACETAMINOPHEN LEVEL
Acetaminophen (Tylenol), Serum: <10 ug/mL — ABNORMAL LOW (ref 10–30)
Acetaminophen (Tylenol), Serum: <10 ug/mL — ABNORMAL LOW (ref 10–30)

## 2022-03-20 LAB — ETHANOL: Alcohol, Ethyl (B): <10 mg/dL (ref <10)

## 2022-03-20 LAB — HCG, QUANTITATIVE, PREGNANCY: hCG, Beta Chain, Quant, S: 1 m[IU]/mL (ref <5)

## 2022-03-20 LAB — MAGNESIUM: Magnesium: 2.2 mg/dL (ref 1.7–2.4)

## 2022-03-20 LAB — SALICYLATE LEVEL: Salicylate Lvl: <7 mg/dL — ABNORMAL LOW (ref 7.0–30.0)

## 2022-03-20 MED ORDER — CHARCOAL ACTIVATED PO LIQD
50.0000 g | Freq: Once | ORAL | Status: AC
  Administered 2022-03-20: 50 g via ORAL
  Filled 2022-03-20: qty 240

## 2022-03-20 NOTE — Consult Note (Signed)
Client overdosed on Trazodone, somnolent on assessment attempt.  Nanine Means, PMHNP

## 2022-03-20 NOTE — ED Provider Notes (Signed)
Emergency department handoff note  Care of this patient was signed out to me at the end of the previous provider shift.  All pertinent patient information was conveyed and all questions were answered.  Patient pending repeat Tylenol as well as repeat EKG.  Patient's repeat Tylenol level is negative and repeat EKG shows narrowing QTc.  Patient is medically cleared for psychiatric admission   Merwyn Katos, MD 03/20/22 2152

## 2022-03-20 NOTE — ED Triage Notes (Signed)
Pt to ED via [facility] from home. Pt states she ingested 12 50mg  trazodone pills approximately PTA in a suicide attempt. Pt denies hx of SI but states she has been stressed and hasn't been getting along with her family. Pt sees a psychiatrist and visits [facility] weekly. Pt denies any recent med changes and states med compliant. Pt denies etoh or drug use. Pt calm and cooperative.

## 2022-03-20 NOTE — ED Provider Notes (Signed)
[facility] Provider Note    Event Date/Time   First MD Initiated Contact with Patient 03/20/22 1314     (approximate)   History   Drug Overdose and Suicidal   HPI  Madison Boyer is a 21 y.o. female with history of schizoaffective disorder who presents after an intentional medication overdose.  The patient states that she took about 12 to 1550 mg trazodone tablets about 30 minutes prior to arrival, sore around 1230 or 1 PM.  She states that she has been increasingly stressed and having conflicts with her family.  She denies continued active SI at this time she denies overdosing on any other medications, denies any drug or alcohol use in the last few days.  She has no acute medical complaints    Physical Exam   Triage Vital Signs: ED Triage Vitals  Enc Vitals Group     BP 03/20/22 1324 (!) 129/116     Pulse Rate 03/20/22 1324 96     Resp 03/20/22 1324 18     Temp 03/20/22 1324 98.1 F (36.7 C)     Temp Source 03/20/22 1324 Oral     SpO2 03/20/22 1324 98 %     Weight 03/20/22 1325 201 lb 3.2 oz (91.3 kg)     Height 03/20/22 1325 5\' 2"  (1.575 m)     Head Circumference --      Peak Flow --      Pain Score 03/20/22 1324 0     Pain Loc --      Pain Edu? --      Excl. in GC? --     Most recent vital signs: Vitals:   03/20/22 1324  BP: (!) 129/116  Pulse: 96  Resp: 18  Temp: 98.1 F (36.7 C)  SpO2: 98%    General: Alert and oriented,, no distress.  CV:  Good peripheral perfusion.  Resp:  Normal effort.  Abd:  No distention.  Other:  EOMI.  PERRLA.  Motor intact in all extremities.  Normal speech.  Slightly dry mucous membranes   ED Results / Procedures / Treatments   Labs (all labs ordered are listed, but only abnormal results are displayed) Labs Reviewed  COMPREHENSIVE METABOLIC PANEL - Abnormal; Notable for the following components:      Result Value   Glucose, Bld 129 (*)    ALT 48 (*)    All other components within  normal limits  SALICYLATE LEVEL - Abnormal; Notable for the following components:   Salicylate Lvl <7.0 (*)    All other components within normal limits  ACETAMINOPHEN LEVEL - Abnormal; Notable for the following components:   Acetaminophen (Tylenol), Serum <10 (*)    All other components within normal limits  CBC - Abnormal; Notable for the following components:   WBC 12.8 (*)    All other components within normal limits  ETHANOL  URINE DRUG SCREEN, QUALITATIVE ([facility] ONLY)  POC URINE PREG, ED     EKG  ED ECG REPORT I, 03/22/22, the attending physician, personally viewed and interpreted this ECG.  Date: 03/20/2022 EKG Time: 1315 Rate: 82 Rhythm: normal sinus rhythm QRS Axis: normal Intervals: normal ST/T Wave abnormalities: normal Narrative Interpretation: no evidence of acute ischemia    RADIOLOGY    PROCEDURES:  Critical Care performed: No  Procedures   MEDICATIONS ORDERED IN ED: Medications  charcoal activated (NO SORBITOL) (ACTIDOSE-AQUA) suspension 50 g (50 g Oral Given 03/20/22 1354)     IMPRESSION /  MDM / ASSESSMENT AND PLAN / ED COURSE  I reviewed the triage vital signs and the nursing notes.  21 year old female with PMH as noted above presents with intentional overdose of trazodone, but no other coingestants.  She denies continued active SI at this time.  She has no acute medical complaints.  I reviewed the past medical records.  The patient was admitted to the psychiatric service in 2021.  Per Dr. Toni Amend discharge summary from 03/31/2020 she was treated for acute psychosis in the context of schizoaffective disorder and cannabis use.  On exam currently her vital signs are normal.  The patient is alert and oriented and the physical exam is otherwise unremarkable  Differential diagnosis includes, but is not limited to, exacerbation of schizoaffective disorder, major depression, adjustment disorder, other mood disorder.  The patient is not  demonstrating signs of acute psychosis at this time.  Given the attempt at self-harm, the patient demonstrates acute danger to self and has been placed under involuntary commitment.\  I will order psychiatry and TTS consults, and lab work-up for medical clearance.  Poison Control Center was consulted and recommends 6 to 8-hour observation, repeat EKG and APAP level after 4 hours, and supportive care.  Patient's presentation is most consistent with acute presentation with potential threat to life or bodily function.  The patient is on the cardiac monitor to evaluate for evidence of arrhythmia and/or significant heart rate changes.  The patient has been placed in psychiatric observation due to the need to provide a safe environment for the patient while obtaining psychiatric consultation and evaluation, as well as ongoing medical and medication management to treat the patient's condition.  The patient has been placed under full IVC at this time.   ----------------------------------------- 4:18 PM on 03/20/2022 -----------------------------------------   Lab work-up is unremarkable.  Electrolytes are normal.  Salicylate and acetaminophen levels are negative.  I consulted with NP Lord from psychiatry who recommends psychiatric admission once the patient is medically cleared.  I signed the patient out to oncoming ED physician Dr. Irwin Brakeman.   FINAL CLINICAL IMPRESSION(S) / ED DIAGNOSES   Final diagnoses:  Medication overdose, intentional self-harm, initial encounter [facility])     Rx / DC Orders   ED Discharge Orders     None        Note:  This document was prepared using Dragon voice recognition software and may include unintentional dictation errors.    Dionne Bucy, MD 03/20/22 [phone]

## 2022-03-20 NOTE — ED Notes (Signed)
IVC/pending psych assessment

## 2022-03-20 NOTE — ED Notes (Addendum)
Poison control contacted. Recommends: Look for seizure activity, QT prolongation, N/V, look at K+ and Mg  Activated charcoal Benzo for agitation or seizures Antiemetics Fluids for hypotensions Observe 6-8hrs  Repeat tylenol and EKG in 4hrs

## 2022-03-20 NOTE — ED Notes (Signed)
On the phone with poison control: Recommends:   Call back with EKG

## 2022-03-21 ENCOUNTER — Inpatient Hospital Stay
Admission: AD | Admit: 2022-03-21 | Discharge: 2022-03-22 | DRG: 885 | Disposition: A | Discharge status: Home / Self Care | Payer: No Typology Code available for payment source | Source: Intra-hospital | Attending: Psychiatry | Admitting: Psychiatry

## 2022-03-21 ENCOUNTER — Encounter: Payer: Self-pay | Admitting: Psychiatry

## 2022-03-21 ENCOUNTER — Other Ambulatory Visit: Payer: Self-pay

## 2022-03-21 DIAGNOSIS — F23 Brief psychotic disorder: Secondary | ICD-10-CM | POA: Diagnosis not present

## 2022-03-21 DIAGNOSIS — Z79899 Other long term (current) drug therapy: Secondary | ICD-10-CM | POA: Diagnosis not present

## 2022-03-21 DIAGNOSIS — F251 Schizoaffective disorder, depressive type: Secondary | ICD-10-CM | POA: Diagnosis not present

## 2022-03-21 DIAGNOSIS — T43201A Poisoning by unspecified antidepressants, accidental (unintentional), initial encounter: Secondary | ICD-10-CM

## 2022-03-21 DIAGNOSIS — Z62820 Parent-biological child conflict: Secondary | ICD-10-CM | POA: Diagnosis present

## 2022-03-21 DIAGNOSIS — T50902A Poisoning by unspecified drugs, medicaments and biological substances, intentional self-harm, initial encounter: Secondary | ICD-10-CM

## 2022-03-21 DIAGNOSIS — Z833 Family history of diabetes mellitus: Secondary | ICD-10-CM | POA: Diagnosis not present

## 2022-03-21 DIAGNOSIS — T50901A Poisoning by unspecified drugs, medicaments and biological substances, accidental (unintentional), initial encounter: Secondary | ICD-10-CM | POA: Insufficient documentation

## 2022-03-21 DIAGNOSIS — T43212A Poisoning by selective serotonin and norepinephrine reuptake inhibitors, intentional self-harm, initial encounter: Secondary | ICD-10-CM | POA: Diagnosis present

## 2022-03-21 DIAGNOSIS — F17290 Nicotine dependence, other tobacco product, uncomplicated: Secondary | ICD-10-CM | POA: Diagnosis present

## 2022-03-21 DIAGNOSIS — F259 Schizoaffective disorder, unspecified: Secondary | ICD-10-CM | POA: Diagnosis present

## 2022-03-21 DIAGNOSIS — Z818 Family history of other mental and behavioral disorders: Secondary | ICD-10-CM

## 2022-03-21 DIAGNOSIS — Z20822 Contact with and (suspected) exposure to covid-19: Secondary | ICD-10-CM | POA: Diagnosis present

## 2022-03-21 LAB — SARS CORONAVIRUS 2 BY RT PCR: SARS Coronavirus 2 by RT PCR: NEGATIVE

## 2022-03-21 MED ORDER — ALUM & MAG HYDROXIDE-SIMETH 200-200-20 MG/5ML PO SUSP: 30.0000 mL | ORAL | Status: DC | PRN

## 2022-03-21 MED ORDER — MAGNESIUM HYDROXIDE 400 MG/5ML PO SUSP: 30.0000 mL | Freq: Every day | ORAL | Status: DC | PRN

## 2022-03-21 MED ORDER — ACETAMINOPHEN 325 MG PO TABS: 650.0000 mg | ORAL_TABLET | Freq: Four times a day (QID) | ORAL | Status: DC | PRN

## 2022-03-21 MED ORDER — HYDROXYZINE HCL 25 MG PO TABS: 25.0000 mg | ORAL_TABLET | Freq: Three times a day (TID) | ORAL | Status: DC | PRN

## 2022-03-21 NOTE — Consult Note (Signed)
[facility] Face-to-Face Psychiatry Consult   Reason for Consult:  Psychiatric evaluation Referring Physician:  Dr. Cyril Loosen Patient Identification: Madison Boyer MRN:  [id number] Principal Diagnosis: <principal problem not specified> Diagnosis:  Active Problems:   Schizoaffective disorder (HCC)   Acute psychosis (HCC)   Total Time spent with patient: 45 minutes  Subjective:   " I really upset"  HPI:  Madison Boyer, 21 y.o., female patient seen  by this provider; chart reviewed and consulted with Dr. Dolores Frame on 03/21/22.  Per triage note, pt to ED via [facility] from home. Pt states she ingested 12 50mg  trazodone pills approximately PTA in a suicide attempt. Pt denies hx of SI but states she has been stressed and hasn't been getting along with her family. Pt sees a psychiatrist and visits [facility] weekly. Pt denies any recent med changes and states med compliant. Pt denies etoh or drug use. Pt calm and cooperative.    During evaluation Madison Boyer is tearful and reports just feeling sad all the time.  She admits to feeling as thought she has no reason to feel "so down". She says takes medication but says it for schizophrenia.  She says she was diagnosed with schizophrenia when she was "high" on acid.  However, since being off of acid, she has never experienced AVH.  She does endorse chronic depressive symptoms, such as, sadness, hopelessness, worthlessness, wanting to isolated and she says mostly feeling unmotivated.  Currently patient is speaking in a clear tone at moderate volume, and normal pace; with good eye contact. Her thought process is coherent and relevant; There is no indication that she is currently responding to internal/external stimuli or experiencing delusional thought content.  Patient denies suicidal/self-harm/homicidal ideation, psychosis, and paranoia.  Patient has remained calm throughout assessment and has answered questions appropriately.   Recommendation:   Inpatient for medication management  Past Psychiatric History: Schizophrenia (Rule out)  Risk to Self:   Risk to Others:   Prior Inpatient Therapy:   Prior Outpatient Therapy:    Past Medical History:  Past Medical History:  Diagnosis Date   Schizophrenic disorder (HCC) 2021    Past Surgical History:  Procedure Laterality Date   APPENDECTOMY     Family History:  Family History  Problem Relation Age of Onset   Healthy Mother    Healthy Father    Healthy Maternal Grandmother    Depression Maternal Grandfather    Diabetes Maternal Grandfather    Healthy Paternal Grandmother    Healthy Paternal Grandfather    Family Psychiatric  History: unknown Social History:  Social History   Substance and Sexual Activity  Alcohol Use Yes   Comment: occassionally     Social History   Substance and Sexual Activity  Drug Use Yes   Types: Marijuana    Social History   Socioeconomic History   Marital status: Single    Spouse name: Not on file   Number of children: Not on file   Years of education: Not on file   Highest education level: Not on file  Occupational History   Not on file  Tobacco Use   Smoking status: Every Day    Types: E-cigarettes   Smokeless tobacco: Never  Vaping Use   Vaping Use: Some days  Substance and Sexual Activity   Alcohol use: Yes    Comment: occassionally   Drug use: Yes    Types: Marijuana   Sexual activity: Yes    Birth control/protection: Condom  Other Topics  Concern   Not on file  Social History Narrative   ** Merged History Encounter **       Social Determinants of Health   Financial Resource Strain: Not on file  Food Insecurity: Not on file  Transportation Needs: Not on file  Physical Activity: Not on file  Stress: Not on file  Social Connections: Not on file   Additional Social History:    Allergies:  No Known Allergies  Labs:  Results for orders placed or performed during the hospital encounter of 03/20/22 (from the  past 48 hour(s))  Comprehensive metabolic panel     Status: Abnormal   Collection Time: 03/20/22  1:19 PM  Result Value Ref Range   Sodium 139 135 - 145 mmol/L   Potassium 3.7 3.5 - 5.1 mmol/L   Chloride 108 98 - 111 mmol/L   CO2 22 22 - 32 mmol/L   Glucose, Bld 129 (H) 70 - 99 mg/dL    Comment: Glucose reference range applies only to samples taken after fasting for at least 8 hours.   BUN 12 6 - 20 mg/dL   Creatinine, Ser 1.610.63 0.44 - 1.00 mg/dL   Calcium 9.0 8.9 - 09.610.3 mg/dL   Total Protein 7.8 6.5 - 8.1 g/dL   Albumin 4.1 3.5 - 5.0 g/dL   AST 33 15 - 41 U/L   ALT 48 (H) 0 - 44 U/L   Alkaline Phosphatase 97 38 - 126 U/L   Total Bilirubin 0.8 0.3 - 1.2 mg/dL   GFR, Estimated >04>60 >54>60 mL/min    Comment: (NOTE) Calculated using the CKD-EPI Creatinine Equation (2021)    Anion gap 9 5 - 15    Comment: Performed at [facility], [address] [place], [place] [address]  Ethanol     Status: None   Collection Time: 03/20/22  1:19 PM  Result Value Ref Range   Alcohol, Ethyl (B) <10 <10 mg/dL    Comment: (NOTE) Lowest detectable limit for serum alcohol is 10 mg/dL.  For medical purposes only. Performed at [facility], [address] [place], [place] [address]   Salicylate level     Status: Abnormal   Collection Time: 03/20/22  1:19 PM  Result Value Ref Range   Salicylate Lvl <7.0 (L) 7.0 - 30.0 mg/dL    Comment: Performed at [facility], [address] [place], [place] [address]  Acetaminophen level     Status: Abnormal   Collection Time: 03/20/22  1:19 PM  Result Value Ref Range   Acetaminophen (Tylenol), Serum <10 (L) 10 - 30 ug/mL    Comment: (NOTE) Therapeutic concentrations vary significantly. A range of 10-30 ug/mL  may be an effective concentration for many patients. However, some  are best treated at concentrations outside of this range. Acetaminophen concentrations >150 ug/mL at 4 hours after ingestion  and >50 ug/mL at 12 hours  after ingestion are often associated with  toxic reactions.  Performed at [facility], [address] [place], [place] [address]   cbc     Status: Abnormal   Collection Time: 03/20/22  1:19 PM  Result Value Ref Range   WBC 12.8 (H) 4.0 - 10.5 K/uL   RBC 4.76 3.87 - 5.11 MIL/uL   Hemoglobin 14.6 12.0 - 15.0 g/dL   HCT 57.843.7 46.936.0 - 62.946.0 %   MCV 91.8 80.0 - 100.0 fL   MCH 30.7 26.0 - 34.0 pg   MCHC 33.4 30.0 - 36.0 g/dL  RDW 12.5 11.5 - 15.5 %   Platelets 368 150 - 400 K/uL   nRBC 0.0 0.0 - 0.2 %    Comment: Performed at [facility], [address] [place], [place] [address]  hCG, quantitative, pregnancy     Status: None   Collection Time: 03/20/22  1:19 PM  Result Value Ref Range   hCG, Beta Chain, Quant, S 1 <5 mIU/mL    Comment:          GEST. AGE      CONC.  (mIU/mL)   <=1 WEEK        5 - 50     2 WEEKS       50 - 500     3 WEEKS       100 - 10,000     4 WEEKS     1,000 - 30,000     5 WEEKS     3,500 - 115,000   6-8 WEEKS     12,000 - 270,000    12 WEEKS     15,000 - 220,000        FEMALE AND NON-PREGNANT FEMALE:     LESS THAN 5 mIU/mL Performed at [facility], [address] [place], [place] [address]   Magnesium     Status: None   Collection Time: 03/20/22  1:19 PM  Result Value Ref Range   Magnesium 2.2 1.7 - 2.4 mg/dL    Comment: Performed at [facility], [address] [place], [place] [address]  Acetaminophen level     Status: Abnormal   Collection Time: 03/20/22  6:50 PM  Result Value Ref Range   Acetaminophen (Tylenol), Serum <10 (L) 10 - 30 ug/mL    Comment: (NOTE) Therapeutic concentrations vary significantly. A range of 10-30 ug/mL  may be an effective concentration for many patients. However, some  are best treated at concentrations outside of this range. Acetaminophen concentrations >150 ug/mL at 4 hours after ingestion  and >50 ug/mL at 12 hours after ingestion are often associated with  toxic  reactions.  Performed at [facility], [address] [place], [place] [address]   Urine Drug Screen, Qualitative     Status: Abnormal   Collection Time: 03/20/22 10:40 PM  Result Value Ref Range   Tricyclic, Ur Screen NONE DETECTED NONE DETECTED   Amphetamines, Ur Screen NONE DETECTED NONE DETECTED   MDMA (Ecstasy)Ur Screen NONE DETECTED NONE DETECTED   Cocaine Metabolite,Ur [place] NONE DETECTED NONE DETECTED   Opiate, Ur Screen NONE DETECTED NONE DETECTED   Phencyclidine (PCP) Ur S NONE DETECTED NONE DETECTED   Cannabinoid 50 Ng, Ur [place] POSITIVE (A) NONE DETECTED   Barbiturates, Ur Screen NONE DETECTED NONE DETECTED   Benzodiazepine, Ur Scrn NONE DETECTED NONE DETECTED   Methadone Scn, Ur NONE DETECTED NONE DETECTED    Comment: (NOTE) Tricyclics + metabolites, urine    Cutoff 1000 ng/mL Amphetamines + metabolites, urine  Cutoff 1000 ng/mL MDMA (Ecstasy), urine              Cutoff 500 ng/mL Cocaine Metabolite, urine          Cutoff 300 ng/mL Opiate + metabolites, urine        Cutoff 300 ng/mL Phencyclidine (PCP), urine         Cutoff 25 ng/mL Cannabinoid, urine                 Cutoff 50 ng/mL Barbiturates + metabolites, urine  Cutoff  200 ng/mL Benzodiazepine, urine              Cutoff 200 ng/mL Methadone, urine                   Cutoff 300 ng/mL  The urine drug screen provides only a preliminary, unconfirmed analytical test result and should not be used for non-medical purposes. Clinical consideration and professional judgment should be applied to any positive drug screen result due to possible interfering substances. A more specific alternate chemical method must be used in order to obtain a confirmed analytical result. Gas chromatography / mass spectrometry (GC/MS) is the preferred confirm atory method. Performed at [facility], [address] [place], [place] [address]     No current facility-administered medications for this encounter.   Current  Outpatient Medications  Medication Sig Dispense Refill   ascorbic acid (VITAMIN C) 100 MG tablet Take by mouth.     risperiDONE (RISPERDAL) 2 MG tablet Take by mouth. (Patient not taking: Reported on 03/20/2022)     traZODone (DESYREL) 50 MG tablet Take by mouth. (Patient not taking: Reported on 03/20/2022)      Musculoskeletal: Strength & Muscle Tone: within normal limits Gait & Station: normal Patient leans: N/A   Psychiatric Specialty Exam:  Presentation  General Appearance: Appropriate for Environment  Eye Contact:Good  Speech:Clear and Coherent  Speech Volume:Normal  Handedness:Right   Mood and Affect  Mood:Depressed  Affect:Appropriate; Tearful   Thought Process  Thought Processes:Coherent  Descriptions of Associations:Intact  Orientation:Full (Time, Place and Person)  Thought Content:Logical  History of Schizophrenia/Schizoaffective disorder:No data recorded Duration of Psychotic Symptoms:No data recorded Hallucinations:Hallucinations: None  Ideas of Reference:None  Suicidal Thoughts:Suicidal Thoughts: No  Homicidal Thoughts:Homicidal Thoughts: No   Sensorium  Memory:Immediate Good  Judgment:Poor  Insight:Good   Executive Functions  Concentration:Fair  Attention Span:Fair  Recall:Fair  Fund of Knowledge:Good  Language:Good   Psychomotor Activity  Psychomotor Activity:Psychomotor Activity: Normal   Assets  Assets:Communication Skills   Sleep  Sleep:Sleep: Fair   Physical Exam: Physical Exam Vitals and nursing note reviewed.  HENT:     Head: Normocephalic and atraumatic.     Nose: Nose normal.     Mouth/Throat:     Mouth: Mucous membranes are moist.  Eyes:     Pupils: Pupils are equal, round, and reactive to light.  Pulmonary:     Effort: Pulmonary effort is normal.  Musculoskeletal:        General: Normal range of motion.     Cervical back: Normal range of motion.  Skin:    General: Skin is dry.  Neurological:      General: No focal deficit present.     Mental Status: She is oriented to person, place, and time.  Psychiatric:        Attention and Perception: Attention and perception normal.        Mood and Affect: Mood is depressed. Affect is tearful.        Speech: Speech normal.        Behavior: Behavior normal. Behavior is cooperative.        Thought Content: Thought content includes suicidal ideation.        Cognition and Memory: Cognition and memory normal.        Judgment: Judgment is impulsive.    Review of Systems  Psychiatric/Behavioral:  Positive for depression and suicidal ideas. Negative for hallucinations and substance abuse. The patient is not nervous/anxious.   All other systems reviewed and  are negative.  Blood pressure 107/71, pulse 62, temperature 98.1 F (36.7 C), temperature source Oral, resp. rate 16, height 5\' 2"  (1.575 m), weight 91.3 kg, SpO2 100 %. Body mass index is 36.8 kg/m.  Treatment Plan Summary: Daily contact with patient to assess and evaluate symptoms and progress in treatment, Medication management, and Plan   Plan:   Review of chart, vital signs, medications, and notes. 1-Individual and group therapy 2-Medication management for depression and anxiety:  Medications reviewed with the patient and he stated no untoward effects, no changes made 3-Coping skills for depression, anxiety, and PTSD 4-Continue crisis stabilization and management 5-Address health issues--monitoring vital signs, stable 6-Treatment plan in progress to prevent relapse of depression and anxiety   Disposition: Recommend psychiatric Inpatient admission when medically cleared. Supportive therapy provided about ongoing stressors. Discussed crisis plan, support from social network, calling 911, coming to the Emergency Department, and calling Suicide Hotline.  , NP 03/21/2022 5:07 AM

## 2022-03-21 NOTE — ED Provider Notes (Signed)
Emergency Medicine Observation Re-evaluation Note  Madison Boyer is a 21 y.o. female, seen on rounds today.  Pt initially presented to the ED for complaints of Drug Overdose and Suicidal Currently, the patient is resting, voicing no medical complaints.  Physical Exam  BP 107/71 (BP Location: Right Arm)   Pulse 62   Temp 98.1 F (36.7 C) (Oral)   Resp 16   Ht 5\' 2"  (1.575 m)   Wt 91.3 kg   SpO2 100%   BMI 36.80 kg/m  Physical Exam General: Resting in no acute distress Cardiac: No cyanosis Lungs: Equal rise and fall Psych: Not agitated  ED Course / MDM  EKG:   I have reviewed the labs performed to date as well as medications administered while in observation.  Recent changes in the last 24 hours include no events overnight.  Plan  Current plan is for psychiatric disposition. is under involuntary commitment.      Dawson Bills, MD 03/21/22 [phone]

## 2022-03-21 NOTE — BH Assessment (Signed)
Patient is to be admitted to [facility] BMU tonight 03/21/22 after 7:30pm by Dr. Toni Amend.  Attending Physician will be Dr.  Toni Amend .   Patient has been assigned to room 304, by [facility] Charge Nurse, Jamesetta So.    ER staff is aware of the admission: Misty Stanley, ER Secretary   Dr. Marisa Severin, ER MD  Florentina Addison, Patient's Nurse  Sue Lush, Patient Access.

## 2022-03-21 NOTE — Plan of Care (Signed)
Patient new to the unit tonight, hasn't had time to progress  Problem: Education: Goal: Knowledge of General Education information will improve Description: Including pain rating scale, medication(s)/side effects and non-pharmacologic comfort measures Outcome: Not Progressing   Problem: Health Behavior/Discharge Planning: Goal: Ability to manage health-related needs will improve Outcome: Not Progressing   Problem: Clinical Measurements: Goal: Ability to maintain clinical measurements within normal limits will improve Outcome: Not Progressing Goal: Will remain free from infection Outcome: Not Progressing Goal: Diagnostic test results will improve Outcome: Not Progressing Goal: Respiratory complications will improve Outcome: Not Progressing Goal: Cardiovascular complication will be avoided Outcome: Not Progressing   Problem: Activity: Goal: Risk for activity intolerance will decrease Outcome: Not Progressing   Problem: Health Behavior/Discharge Planning: Goal: Ability to manage health-related needs will improve Outcome: Not Progressing

## 2022-03-21 NOTE — Tx Team (Signed)
Initial Treatment Plan 03/21/2022 8:47 PM Madison Boyer OIZ:[id number]    PATIENT STRESSORS: Marital or family conflict   Substance abuse     PATIENT STRENGTHS: Ability for insight  Motivation for treatment/growth    PATIENT IDENTIFIED PROBLEMS: Depression  Anxiety  Suicidal Ideation                 DISCHARGE CRITERIA:  Motivation to continue treatment in a less acute level of care Verbal commitment to aftercare and medication compliance  PRELIMINARY DISCHARGE PLAN: Outpatient therapy Placement in alternative living arrangements  PATIENT/FAMILY INVOLVEMENT: This treatment plan has been presented to and reviewed with the patient, Madison Boyer. The patient has been given the opportunity to ask questions and make suggestions.  Elmyra Ricks, RN 03/21/2022, 8:47 PM

## 2022-03-21 NOTE — ED Notes (Signed)
IVC  GOING  TO  [facility]  TONIGHT 

## 2022-03-21 NOTE — ED Notes (Signed)
Report given to BMU RN.  

## 2022-03-21 NOTE — Progress Notes (Signed)
Patient admitted from [facility] - ED, report received from [place], [place]. Pt calm and pleasant during assessment. Pt stated she made a mistake of taking an overdose of Trazodone after an argument with her mother. Pt has good insight. Pt thinks that she was misdiagnosed previously and wants to talk to the doctor about it tomorrow. Pt given education. Pt denies SI/HI/AVH. Pt endorses depression. Pt oriented to the unit and her room. Pt didn't have any medication scheduled tonight and hasn't requested anything PRN. Pt being monitored Q 15 minutes for safety per unit protocol, remains safe on the unit.

## 2022-03-22 DIAGNOSIS — F251 Schizoaffective disorder, depressive type: Secondary | ICD-10-CM

## 2022-03-22 DIAGNOSIS — T43201A Poisoning by unspecified antidepressants, accidental (unintentional), initial encounter: Secondary | ICD-10-CM

## 2022-03-22 NOTE — Plan of Care (Signed)
  Problem: Communication Goal: STG - Patient will demonstrate improved communication skills by spontaneously contributing to 2 group discussions within 5 recreation therapy group sessions Description: STG - Patient will demonstrate improved communication skills by spontaneously contributing to 2 group discussions within 5 recreation therapy group sessions 03/22/2022 1421 by Alveria Apley, LRT Outcome: Adequate for Discharge 03/22/2022 1421 by Alveria Apley, LRT Outcome: Adequate for Discharge

## 2022-03-22 NOTE — [facility] Suicide Risk Assessment (Signed)
[facility] Discharge Suicide Risk Assessment   Principal Problem: Schizoaffective disorder [facility]) Discharge Diagnoses: Principal Problem:   Schizoaffective disorder (HCC) Active Problems:   Overdose of antidepressant   Total Time spent with patient: 1 hour  Musculoskeletal: Strength & Muscle Tone: within normal limits Gait & Station: normal Patient leans: N/A  Psychiatric Specialty Exam  Presentation  General Appearance: Appropriate for Environment  Eye Contact:Good  Speech:Clear and Coherent  Speech Volume:Normal  Handedness:Right   Mood and Affect  Mood:Depressed  Duration of Depression Symptoms: No data recorded Affect:Appropriate; Tearful   Thought Process  Thought Processes:Coherent  Descriptions of Associations:Intact  Orientation:Full (Time, Place and Person)  Thought Content:Logical  History of Schizophrenia/Schizoaffective disorder:No data recorded Duration of Psychotic Symptoms:No data recorded Hallucinations:Hallucinations: None  Ideas of Reference:None  Suicidal Thoughts:Suicidal Thoughts: No  Homicidal Thoughts:Homicidal Thoughts: No   Sensorium  Memory:Immediate Good  Judgment:Poor  Insight:Good   Executive Functions  Concentration:Fair  Attention Span:Fair  Recall:Fair  Fund of Knowledge:Good  Language:Good   Psychomotor Activity  Psychomotor Activity:Psychomotor Activity: Normal   Assets  Assets:Communication Skills   Sleep  Sleep:Sleep: Fair   Physical Exam: Physical Exam Vitals and nursing note reviewed.  Constitutional:      Appearance: Normal appearance.  HENT:     Head: Normocephalic and atraumatic.     Mouth/Throat:     Pharynx: Oropharynx is clear.  Eyes:     Pupils: Pupils are equal, round, and reactive to light.  Cardiovascular:     Rate and Rhythm: Normal rate and regular rhythm.  Pulmonary:     Effort: Pulmonary effort is normal.     Breath sounds: Normal breath sounds.  Abdominal:      General: Abdomen is flat.     Palpations: Abdomen is soft.  Musculoskeletal:        General: Normal range of motion.  Skin:    General: Skin is warm and dry.  Neurological:     General: No focal deficit present.     Mental Status: She is alert. Mental status is at baseline.  Psychiatric:        Attention and Perception: Attention normal.        Mood and Affect: Mood normal.        Speech: Speech normal.        Behavior: Behavior is cooperative.        Thought Content: Thought content normal.        Cognition and Memory: Cognition normal.        Judgment: Judgment normal.    Review of Systems  Constitutional: Negative.   HENT: Negative.    Eyes: Negative.   Respiratory: Negative.    Cardiovascular: Negative.   Gastrointestinal: Negative.   Musculoskeletal: Negative.   Skin: Negative.   Neurological: Negative.   Psychiatric/Behavioral: Negative.     Blood pressure 105/87, pulse 76, temperature 98.3 F (36.8 C), temperature source Oral, resp. rate 18, height 5\' 2"  (1.575 m), weight 90.7 kg, SpO2 100 %. Body mass index is 36.58 kg/m.  Mental Status Per Nursing Assessment::   On Admission:  Self-harm thoughts  Demographic Factors:  Adolescent or young adult  Loss Factors: NA  Historical Factors: Impulsivity  Risk Reduction Factors:   Sense of responsibility to family, Religious beliefs about death, Employed, Living with another person, especially a relative, Positive social support, and Positive therapeutic relationship  Continued Clinical Symptoms:  Bipolar Disorder:   Mixed State  Cognitive Features That Contribute To Risk:  None  Suicide Risk:  Minimal: No identifiable suicidal ideation.  Patients presenting with no risk factors but with morbid ruminations; may be classified as minimal risk based on the severity of the depressive symptoms    Plan Of Care/Follow-up recommendations:  Patient is calm and appropriate and agreeable to outpatient treatment.   Denies any suicidal thought.  Patient will be discharged home with continue follow-up at [facility].  Mordecai Rasmussen, MD 03/22/2022, 1:55 PM

## 2022-03-22 NOTE — H&P (Signed)
Psychiatric Admission Assessment Adult  Patient Identification: Madison Boyer MRN:  [id number] Date of Evaluation:  03/22/2022 Chief Complaint:  Schizoaffective disorder (HCC) [F25.9] Principal Diagnosis: Schizoaffective disorder (HCC) Diagnosis:  Principal Problem:   Schizoaffective disorder (HCC) Active Problems:   Overdose of antidepressant  History of Present Illness: Patient seen and chart reviewed.  Patient is a 21 year old woman with a history of chronic mental health problems who came to the hospital Sunday after overdosing on trazodone.  She reports that she took about 12 trazodone around noon.  She and her mother had been having an argument over a boy.  Apparently this sort of argument is pretty typical at home patient feels unsupported by her mother a lot.  Patient reports that after taking the overdose she immediately informed her mother and cooperated with seeking medical help.  Prior to the argument she says her mood overall has been fine and stable.  No major severe depressive episodes.  Overall enjoys her life and her work.  Sleeping well and eating well.  Compliant with outpatient medicine.  Goes for therapy and medicine management through [facility].  Patient admits that she has used some cannabis again recently.  She says she has cut back a lot on it and certainly no longer uses hallucinogens but still occasionally uses cannabis.  Since coming to the hospital she has consistently denied any ongoing suicidal thought intent or plan Associated Signs/Symptoms: Depression Symptoms:  suicidal attempt, Duration of Depression Symptoms: No data recorded (Hypo) Manic Symptoms:  Impulsivity, Anxiety Symptoms:  Excessive Worry, Psychotic Symptoms:   None reported PTSD Symptoms: Negative Total Time spent with patient: 30 minutes  Past Psychiatric History: Patient has a past history of chronic mental health problems.  Previously had been diagnosed with schizoaffective disorder and had  had psychotic symptoms although it looks like it is becoming more clear that her psychotic symptoms only occur when she is abusing drugs.  Since she has not been abusing hallucinogens her psychotic symptoms have gone and she is not currently taking any antipsychotic medication.  She does have a past history of some suicidal ideation but most of the hospitalizations have been based on psychosis.  Currently following up with outpatient treatment at [facility]  Is the patient at risk to self? No.  Has the patient been a risk to self in the past 6 months? Yes.    Has the patient been a risk to self within the distant past? Yes.    Is the patient a risk to others? No.  Has the patient been a risk to others in the past 6 months? No.  Has the patient been a risk to others within the distant past? No.   Prior Inpatient Therapy:   Prior Outpatient Therapy:    Alcohol Screening: 1. How often do you have a drink containing alcohol?: Never 2. How many drinks containing alcohol do you have on a typical day when you are drinking?: 1 or 2 3. How often do you have six or more drinks on one occasion?: Never AUDIT-C Score: 0 4. How often during the last year have you found that you were not able to stop drinking once you had started?: Never 5. How often during the last year have you failed to do what was normally expected from you because of drinking?: Never 6. How often during the last year have you needed a first drink in the morning to get yourself going after a heavy drinking session?: Never 7. How often during the  last year have you had a feeling of guilt of remorse after drinking?: Never 8. How often during the last year have you been unable to remember what happened the night before because you had been drinking?: Never 9. Have you or someone else been injured as a result of your drinking?: No 10. Has a relative or friend or a doctor or another health worker been concerned about your drinking or suggested you  cut down?: No Alcohol Use Disorder Identification Test Final Score (AUDIT): 0 Substance Abuse History in the last 12 months:  Yes.   Consequences of Substance Abuse: Patient admits that ongoing cannabis abuse is starting to cause her to be anxious and she thinks she is going to try to stop it Previous Psychotropic Medications: Yes  Psychological Evaluations: Yes  Past Medical History:  Past Medical History:  Diagnosis Date   Schizophrenic disorder (HCC) 2021    Past Surgical History:  Procedure Laterality Date   APPENDECTOMY     Family History:  Family History  Problem Relation Age of Onset   Healthy Mother    Healthy Father    Healthy Maternal Grandmother    Depression Maternal Grandfather    Diabetes Maternal Grandfather    Healthy Paternal Grandmother    Healthy Paternal Grandfather    Family Psychiatric  History: None reported Tobacco Screening:   Social History:  Social History   Substance and Sexual Activity  Alcohol Use Not Currently   Comment: occassionally     Social History   Substance and Sexual Activity  Drug Use Yes   Types: Marijuana    Additional Social History:                           Allergies:  No Known Allergies Lab Results:  Results for orders placed or performed during the hospital encounter of 03/20/22 (from the past 48 hour(s))  Acetaminophen level     Status: Abnormal   Collection Time: 03/20/22  6:50 PM  Result Value Ref Range   Acetaminophen (Tylenol), Serum <10 (L) 10 - 30 ug/mL    Comment: (NOTE) Therapeutic concentrations vary significantly. A range of 10-30 ug/mL  may be an effective concentration for many patients. However, some  are best treated at concentrations outside of this range. Acetaminophen concentrations >150 ug/mL at 4 hours after ingestion  and >50 ug/mL at 12 hours after ingestion are often associated with  toxic reactions.  Performed at [facility], [address] [place], [place]  [address]   Urine Drug Screen, Qualitative     Status: Abnormal   Collection Time: 03/20/22 10:40 PM  Result Value Ref Range   Tricyclic, Ur Screen NONE DETECTED NONE DETECTED   Amphetamines, Ur Screen NONE DETECTED NONE DETECTED   MDMA (Ecstasy)Ur Screen NONE DETECTED NONE DETECTED   Cocaine Metabolite,Ur [place] NONE DETECTED NONE DETECTED   Opiate, Ur Screen NONE DETECTED NONE DETECTED   Phencyclidine (PCP) Ur S NONE DETECTED NONE DETECTED   Cannabinoid 50 Ng, Ur [place] POSITIVE (A) NONE DETECTED   Barbiturates, Ur Screen NONE DETECTED NONE DETECTED   Benzodiazepine, Ur Scrn NONE DETECTED NONE DETECTED   Methadone Scn, Ur NONE DETECTED NONE DETECTED    Comment: (NOTE) Tricyclics + metabolites, urine    Cutoff 1000 ng/mL Amphetamines + metabolites, urine  Cutoff 1000 ng/mL MDMA (Ecstasy), urine              Cutoff 500 ng/mL Cocaine Metabolite, urine  Cutoff 300 ng/mL Opiate + metabolites, urine        Cutoff 300 ng/mL Phencyclidine (PCP), urine         Cutoff 25 ng/mL Cannabinoid, urine                 Cutoff 50 ng/mL Barbiturates + metabolites, urine  Cutoff 200 ng/mL Benzodiazepine, urine              Cutoff 200 ng/mL Methadone, urine                   Cutoff 300 ng/mL  The urine drug screen provides only a preliminary, unconfirmed analytical test result and should not be used for non-medical purposes. Clinical consideration and professional judgment should be applied to any positive drug screen result due to possible interfering substances. A more specific alternate chemical method must be used in order to obtain a confirmed analytical result. Gas chromatography / mass spectrometry (GC/MS) is the preferred confirm atory method. Performed at [facility], [address] [place], [place] [address]   SARS Coronavirus 2 by RT PCR (hospital order, performed in [facility] hospital lab) *cepheid single result test* Anterior Nasal Swab     Status: None   Collection Time:  03/21/22  4:46 PM   Specimen: Anterior Nasal Swab  Result Value Ref Range   SARS Coronavirus 2 by RT PCR NEGATIVE NEGATIVE    Comment: (NOTE) SARS-CoV-2 target nucleic acids are NOT DETECTED.  The SARS-CoV-2 RNA is generally detectable in upper and lower respiratory specimens during the acute phase of infection. The lowest concentration of SARS-CoV-2 viral copies this assay can detect is 250 copies / mL. A negative result does not preclude SARS-CoV-2 infection and should not be used as the sole basis for treatment or other patient management decisions.  A negative result may occur with improper specimen collection / handling, submission of specimen other than nasopharyngeal swab, presence of viral mutation(s) within the areas targeted by this assay, and inadequate number of viral copies (<250 copies / mL). A negative result must be combined with clinical observations, patient history, and epidemiological information.  Fact Sheet for Patients:   RoadLapTop.co.zahttps://www.fda.gov/media/158405/download  Fact Sheet for Healthcare Providers: http://kim-miller.com/https://www.fda.gov/media/158404/download  This test is not yet approved or  cleared by the [place] FDA and has been authorized for detection and/or diagnosis of SARS-CoV-2 by FDA under an Emergency Use Authorization (EUA).  This EUA will remain in effect (meaning this test can be used) for the duration of the COVID-19 declaration under Section 564(b)(1) of the Act, 21 U.S.C. section 360bbb-3(b)(1), unless the authorization is terminated or revoked sooner.  Performed at [facility], [address] [place], [place] [address]     Blood Alcohol level:  Lab Results  Component Value Date   [facility] 03/20/2022   ETH <10 03/24/2020    Metabolic Disorder Labs:  Lab Results  Component Value Date   HGBA1C 4.8 09/04/2018   MPG 91.06 09/04/2018   No results found for: "PROLACTIN" Lab Results  Component Value Date   CHOL 160 03/25/2020    TRIG 88 03/25/2020   HDL 56 03/25/2020   CHOLHDL 2.9 03/25/2020   VLDL 18 03/25/2020   LDLCALC 86 03/25/2020   LDLCALC 76 09/04/2018    Current Medications: Current Facility-Administered Medications  Medication Dose Route Frequency Provider Last Rate Last Admin   acetaminophen (TYLENOL) tablet 650 mg  650 mg Oral Q6H PRN Vanetta MuldersBarthold, Louise F, NP  alum & mag hydroxide-simeth (MAALOX/MYLANTA) 200-200-20 MG/5ML suspension 30 mL  30 mL Oral Q4H PRN Vanetta Mulders, NP       hydrOXYzine (ATARAX) tablet 25 mg  25 mg Oral TID PRN Vanetta Mulders, NP       magnesium hydroxide (MILK OF MAGNESIA) suspension 30 mL  30 mL Oral Daily PRN Vanetta Mulders, NP       PTA Medications: Medications Prior to Admission  Medication Sig Dispense Refill Last Dose   ascorbic acid (VITAMIN C) 100 MG tablet Take by mouth.      risperiDONE (RISPERDAL) 2 MG tablet Take by mouth. (Patient not taking: Reported on 03/20/2022)      traZODone (DESYREL) 50 MG tablet Take by mouth. (Patient not taking: Reported on 03/20/2022)       Musculoskeletal: Strength & Muscle Tone: within normal limits Gait & Station: normal Patient leans: N/A            Psychiatric Specialty Exam:  Presentation  General Appearance: Appropriate for Environment  Eye Contact:Good  Speech:Clear and Coherent  Speech Volume:Normal  Handedness:Right   Mood and Affect  Mood:Depressed  Affect:Appropriate; Tearful   Thought Process  Thought Processes:Coherent  Duration of Psychotic Symptoms: No data recorded Past Diagnosis of Schizophrenia or Psychoactive disorder: No data recorded Descriptions of Associations:Intact  Orientation:Full (Time, Place and Person)  Thought Content:Logical  Hallucinations:Hallucinations: None  Ideas of Reference:None  Suicidal Thoughts:Suicidal Thoughts: No  Homicidal Thoughts:Homicidal Thoughts: No   Sensorium  Memory:Immediate  Good  Judgment:Poor  Insight:Good   Executive Functions  Concentration:Fair  Attention Span:Fair  Recall:Fair  Fund of Knowledge:Good  Language:Good   Psychomotor Activity  Psychomotor Activity:Psychomotor Activity: Normal   Assets  Assets:Communication Skills   Sleep  Sleep:Sleep: Fair    Physical Exam: Physical Exam Vitals and nursing note reviewed.  Constitutional:      Appearance: Normal appearance.  HENT:     Head: Normocephalic and atraumatic.     Mouth/Throat:     Pharynx: Oropharynx is clear.  Eyes:     Pupils: Pupils are equal, round, and reactive to light.  Cardiovascular:     Rate and Rhythm: Normal rate and regular rhythm.  Pulmonary:     Effort: Pulmonary effort is normal.     Breath sounds: Normal breath sounds.  Abdominal:     General: Abdomen is flat.     Palpations: Abdomen is soft.  Musculoskeletal:        General: Normal range of motion.  Skin:    General: Skin is warm and dry.  Neurological:     General: No focal deficit present.     Mental Status: She is alert. Mental status is at baseline.  Psychiatric:        Attention and Perception: Attention normal.        Mood and Affect: Mood normal.        Speech: Speech normal.        Behavior: Behavior normal.        Thought Content: Thought content normal.        Cognition and Memory: Cognition normal.        Judgment: Judgment normal.    Review of Systems  Constitutional: Negative.   HENT: Negative.    Eyes: Negative.   Respiratory: Negative.    Cardiovascular: Negative.   Gastrointestinal: Negative.   Musculoskeletal: Negative.   Skin: Negative.   Neurological: Negative.   Psychiatric/Behavioral: Negative.     Blood pressure 105/87, pulse 76,  temperature 98.3 F (36.8 C), temperature source Oral, resp. rate 18, height 5\' 2"  (1.575 m), weight 90.7 kg, SpO2 100 %. Body mass index is 36.58 kg/m.  Treatment Plan Summary: Plan patient is requesting to be discharged today  wanting to go back to work as soon as possible.  She denies any suicidal ideation.  Affect is smiling and upbeat.  Thoughts lucid with no sign of delusions or psychosis.  Denies auditory or visual hallucinations.  Agrees to continued outpatient treatment at [facility].  Patient received psychoeducation and appropriate counseling and will be referred back to outpatient treatment.  No change in medication.  Case reviewed with treatment team.  Observation Level/Precautions:  15 minute checks  Laboratory:  UDS  Psychotherapy:    Medications:    Consultations:    Discharge Concerns:    Estimated LOS:  Other:     Physician Treatment Plan for Primary Diagnosis: Schizoaffective disorder (HCC) Long Term Goal(s): Improvement in symptoms so as ready for discharge  Short Term Goals: Ability to verbalize feelings will improve, Ability to disclose and discuss suicidal ideas, and Ability to demonstrate self-control will improve  Physician Treatment Plan for Secondary Diagnosis: Principal Problem:   Schizoaffective disorder (HCC) Active Problems:   Overdose of antidepressant  Long Term Goal(s): Improvement in symptoms so as ready for discharge  Short Term Goals: Compliance with prescribed medications will improve  I certify that inpatient services furnished can reasonably be expected to improve the patient's condition.    [facility], MD 6/20/20231:58 PM

## 2022-03-22 NOTE — Progress Notes (Signed)
  [facility] Adult Case Management Discharge Plan :  Will you be returning to the same living situation after discharge:  Yes,  pt reports that she is returning home At discharge, do you have transportation home?: Yes,  pt reports that aunt will provide transportation. Do you have the ability to pay for your medications: Yes,  [employer]  Release of information consent forms completed and in the chart;  Patient's signature needed at discharge.  Patient to Follow up at:  Follow-up Information     [facility] Follow up.   Why: Appointment is scheduled 03/25/22 at 11AM. Contact information: [address] [place] [address] [phone]                 Next level of care provider has access to [facility] Link:no  Safety Planning and Suicide Prevention discussed: Yes,  SPE completed with the patient.      Has patient been referred to the Quitline?: Yes, faxed on 03/22/2022  Patient has been referred for addiction treatment: Pt. refused referral  Harden Mo, LCSW 03/22/2022, 2:43 PM

## 2022-03-22 NOTE — Discharge Summary (Signed)
Physician Discharge Summary Note  Patient:  Madison Boyer is an 21 y.o., female MRN:  [id number] DOB:  2001-05-11 Patient phone:  [phone] (home)  Patient address:   [address] [place] [address],  Total Time spent with patient: 30 minutes  Date of Admission:  03/21/2022 Date of Discharge: 03/22/2022  Reason for Admission: Admitted after presenting to the emergency room with an overdose of trazodone  Principal Problem: Schizoaffective disorder [facility]) Discharge Diagnoses: Principal Problem:   Schizoaffective disorder (HCC) Active Problems:   Overdose of antidepressant   Past Psychiatric History: Past history of schizoaffective disorder with more recent evaluation suggesting that psychosis previously was mostly due to substance abuse.  Chronic mood instability sadness and anxiety now being seen as an outpatient.  Past Medical History:  Past Medical History:  Diagnosis Date   Schizophrenic disorder (HCC) 2021    Past Surgical History:  Procedure Laterality Date   APPENDECTOMY     Family History:  Family History  Problem Relation Age of Onset   Healthy Mother    Healthy Father    Healthy Maternal Grandmother    Depression Maternal Grandfather    Diabetes Maternal Grandfather    Healthy Paternal Grandmother    Healthy Paternal Grandfather    Family Psychiatric  History: Nothing reported Social History:  Social History   Substance and Sexual Activity  Alcohol Use Not Currently   Comment: occassionally     Social History   Substance and Sexual Activity  Drug Use Yes   Types: Marijuana    Social History   Socioeconomic History   Marital status: Single    Spouse name: Not on file   Number of children: Not on file   Years of education: Not on file   Highest education level: Not on file  Occupational History   Not on file  Tobacco Use   Smoking status: Every Day    Types: E-cigarettes   Smokeless tobacco: Never  Vaping Use   Vaping Use:  Some days  Substance and Sexual Activity   Alcohol use: Not Currently    Comment: occassionally   Drug use: Yes    Types: Marijuana   Sexual activity: Yes    Birth control/protection: Condom  Other Topics Concern   Not on file  Social History Narrative   ** Merged History Encounter **       Social Determinants of Health   Financial Resource Strain: Not on file  Food Insecurity: Not on file  Transportation Needs: Not on file  Physical Activity: Not on file  Stress: Not on file  Social Connections: Not on file    Hospital Course: Admitted to the psychiatric unit.  15-minute checks maintained.  Patient did not display any dangerous aggressive or violent behavior and did not display any suicidal behavior.  She was cooperative and appropriate throughout her time in the hospital.  Consistently denies any suicidal ideation.  Patient has a follow-up already in place with [facility] and values her time returning to work.  Very positive upbeat plans for the future.  Psychoeducation provided.  No change offered to medication at this time.  Patient can be discharged and follow up with outpatient treatment at [facility]  Physical Findings: AIMS:  , ,  ,  ,    CIWA:    COWS:     Musculoskeletal: Strength & Muscle Tone: within normal limits Gait & Station: normal Patient leans: N/A   Psychiatric Specialty Exam:  Presentation  General Appearance: Appropriate for  Environment  Eye Contact:Good  Speech:Clear and Coherent  Speech Volume:Normal  Handedness:Right   Mood and Affect  Mood:Depressed  Affect:Appropriate; Tearful   Thought Process  Thought Processes:Coherent  Descriptions of Associations:Intact  Orientation:Full (Time, Place and Person)  Thought Content:Logical  History of Schizophrenia/Schizoaffective disorder:No data recorded Duration of Psychotic Symptoms:No data recorded Hallucinations:Hallucinations: None  Ideas of Reference:None  Suicidal Thoughts:Suicidal  Thoughts: No  Homicidal Thoughts:Homicidal Thoughts: No   Sensorium  Memory:Immediate Good  Judgment:Poor  Insight:Good   Executive Functions  Concentration:Fair  Attention Span:Fair  Recall:Fair  Fund of Knowledge:Good  Language:Good   Psychomotor Activity  Psychomotor Activity:Psychomotor Activity: Normal   Assets  Assets:Communication Skills   Sleep  Sleep:Sleep: Fair    Physical Exam: Physical Exam Vitals and nursing note reviewed.  Constitutional:      Appearance: Normal appearance.  HENT:     Head: Normocephalic and atraumatic.     Mouth/Throat:     Pharynx: Oropharynx is clear.  Eyes:     Pupils: Pupils are equal, round, and reactive to light.  Cardiovascular:     Rate and Rhythm: Normal rate and regular rhythm.  Pulmonary:     Effort: Pulmonary effort is normal.     Breath sounds: Normal breath sounds.  Abdominal:     General: Abdomen is flat.     Palpations: Abdomen is soft.  Musculoskeletal:        General: Normal range of motion.  Skin:    General: Skin is warm and dry.  Neurological:     General: No focal deficit present.     Mental Status: She is alert. Mental status is at baseline.  Psychiatric:        Attention and Perception: Attention normal.        Mood and Affect: Mood normal.        Speech: Speech normal.        Behavior: Behavior is cooperative.        Thought Content: Thought content normal.        Cognition and Memory: Cognition normal.        Judgment: Judgment normal.    Review of Systems  Constitutional: Negative.   HENT: Negative.    Eyes: Negative.   Respiratory: Negative.    Cardiovascular: Negative.   Gastrointestinal: Negative.   Musculoskeletal: Negative.   Skin: Negative.   Neurological: Negative.   Psychiatric/Behavioral: Negative.     Blood pressure 105/87, pulse 76, temperature 98.3 F (36.8 C), temperature source Oral, resp. rate 18, height 5\' 2"  (1.575 m), weight 90.7 kg, SpO2 100 %. Body mass  index is 36.58 kg/m.   Social History   Tobacco Use  Smoking Status Every Day   Types: E-cigarettes  Smokeless Tobacco Never   Tobacco Cessation:  A prescription for an FDA-approved tobacco cessation medication was offered at discharge and the patient refused   Blood Alcohol level:  Lab Results  Component Value Date   [facility] <10 03/20/2022   ETH <10 03/24/2020    Metabolic Disorder Labs:  Lab Results  Component Value Date   HGBA1C 4.8 09/04/2018   MPG 91.06 09/04/2018   No results found for: "PROLACTIN" Lab Results  Component Value Date   CHOL 160 03/25/2020   TRIG 88 03/25/2020   HDL 56 03/25/2020   CHOLHDL 2.9 03/25/2020   VLDL 18 03/25/2020   LDLCALC 86 03/25/2020   LDLCALC 76 09/04/2018    See Psychiatric Specialty Exam and Suicide Risk Assessment completed by Attending Physician prior  to discharge.  Discharge destination:  Home  Is patient on multiple antipsychotic therapies at discharge:  No   Has Patient had three or more failed trials of antipsychotic monotherapy by history:  No  Recommended Plan for Multiple Antipsychotic Therapies: NA  Discharge Instructions     Diet - low sodium heart healthy   Complete by: As directed    Increase activity slowly   Complete by: As directed       Allergies as of 03/22/2022   No Known Allergies      Medication List     STOP taking these medications    ascorbic acid 100 MG tablet Commonly known as: VITAMIN C   risperiDONE 2 MG tablet Commonly known as: RISPERDAL       TAKE these medications      Indication  traZODone 50 MG tablet Commonly known as: DESYREL Take by mouth.  Indication: Trouble Sleeping         Follow-up recommendations: No new prescriptions provided continue outpatient follow-up as usual  Comments:     Signed: Mordecai Rasmussen, MD 03/22/2022, 2:12 PM

## 2022-03-22 NOTE — Plan of Care (Addendum)
Patient during assessments this morning endorsed a normal mood and affect was congruent to mood. Pt. Denied si/hi/avh and endorsed ability to continue to remain safe on the unit. Pt. Orientation appeared grossly intact. Pt. Denied physical pain and endorsed toleration of medications thus far. Pt. Endorsed no problems with sleeping and or eating at this time. Pt. Had no complaints for this Clinical research associate.   Patient has been complaint with unit procedures thus far. Pt. Has been observed eating good thus far. Pt. Has been able to remain safe on the unit thus far.    Q x 15 minute observation checks in place/maintained for safety. Patient is provided with education throughout shift when appropriate and able.  Patient is given/offered medications per orders. Patient is encouraged to attend groups, participate in unit activities and continue with plan of care. Pt. Chart and plans of care reviewed. Pt. Given support and encouragement when appropriate and able.      Problem: Safety: Goal: Ability to remain free from injury will improve Outcome: Progressing Note: Patient denied SI/HI/AVH.

## 2022-03-22 NOTE — [facility] Suicide Risk Assessment (Signed)
[facility] INPATIENT:  Family/Significant Other Suicide Prevention Education  Suicide Prevention Education:  Patient Refusal for Family/Significant Other Suicide Prevention Education: The patient Madison Boyer has refused to provide written consent for family/significant other to be provided Family/Significant Other Suicide Prevention Education during admission and/or prior to discharge.  Physician notified.  SPE completed with pt, as pt refused to consent to family contact. SPI pamphlet provided to pt and pt was encouraged to share information with support network, ask questions, and talk about any concerns relating to SPE. Pt denies access to guns/firearms and verbalized understanding of information provided. Mobile Crisis information also provided to pt.  Glenis Smoker 03/22/2022, 1:56 PM

## 2022-03-22 NOTE — [facility] Suicide Risk Assessment (Signed)
[facility] Admission Suicide Risk Assessment   Nursing information obtained from:  Patient Demographic factors:  Low socioeconomic status, Adolescent or young adult Current Mental Status:  Self-harm thoughts Loss Factors:  Loss of significant relationship Historical Factors:  NA Risk Reduction Factors:  Employed, Positive therapeutic relationship  Total Time spent with patient: 1 hour Principal Problem: Schizoaffective disorder (HCC) Diagnosis:  Principal Problem:   Schizoaffective disorder (HCC) Active Problems:   Overdose of antidepressant  Subjective Data: Patient seen and chart reviewed.  21 year old woman with a history of chronic mental health problems took an overdose of her prescription trazodone around noon on Sunday.  She had had an argument with her mother at the time.  As soon as she took the pills she reached out for help.  Patient completely denies any suicidal ideation thought or plan.  Patient denies being depressed currently.  Expresses remorse for what she did and is able to articulate a good plan for continuing with her mental health follow-up to try and prevent further episodes.  Continued Clinical Symptoms:  Alcohol Use Disorder Identification Test Final Score (AUDIT): 0 The "Alcohol Use Disorders Identification Test", Guidelines for Use in Primary Care, Second Edition.  World Science writer [facility]). Score between 0-7:  no or low risk or alcohol related problems. Score between 8-15:  moderate risk of alcohol related problems. Score between 16-19:  high risk of alcohol related problems. Score 20 or above:  warrants further diagnostic evaluation for alcohol dependence and treatment.   CLINICAL FACTORS:   Bipolar Disorder:   Mixed State   Musculoskeletal: Strength & Muscle Tone: within normal limits Gait & Station: normal Patient leans: N/A  Psychiatric Specialty Exam:  Presentation  General Appearance: Appropriate for Environment  Eye  Contact:Good  Speech:Clear and Coherent  Speech Volume:Normal  Handedness:Right   Mood and Affect  Mood:Depressed  Affect:Appropriate; Tearful   Thought Process  Thought Processes:Coherent  Descriptions of Associations:Intact  Orientation:Full (Time, Place and Person)  Thought Content:Logical  History of Schizophrenia/Schizoaffective disorder:No data recorded Duration of Psychotic Symptoms:No data recorded Hallucinations:Hallucinations: None  Ideas of Reference:None  Suicidal Thoughts:Suicidal Thoughts: No  Homicidal Thoughts:Homicidal Thoughts: No   Sensorium  Memory:Immediate Good  Judgment:Poor  Insight:Good   Executive Functions  Concentration:Fair  Attention Span:Fair  Recall:Fair  Fund of Knowledge:Good  Language:Good   Psychomotor Activity  Psychomotor Activity:Psychomotor Activity: Normal   Assets  Assets:Communication Skills   Sleep  Sleep:Sleep: Fair    Physical Exam: Physical Exam Vitals and nursing note reviewed.  Constitutional:      Appearance: Normal appearance.  HENT:     Head: Normocephalic and atraumatic.     Mouth/Throat:     Pharynx: Oropharynx is clear.  Eyes:     Pupils: Pupils are equal, round, and reactive to light.  Cardiovascular:     Rate and Rhythm: Normal rate and regular rhythm.  Pulmonary:     Effort: Pulmonary effort is normal.     Breath sounds: Normal breath sounds.  Abdominal:     General: Abdomen is flat.     Palpations: Abdomen is soft.  Musculoskeletal:        General: Normal range of motion.  Skin:    General: Skin is warm and dry.  Neurological:     General: No focal deficit present.     Mental Status: She is alert. Mental status is at baseline.  Psychiatric:        Attention and Perception: Attention normal.        Mood  and Affect: Mood normal.        Speech: Speech normal.        Behavior: Behavior normal.        Thought Content: Thought content normal.        Cognition and  Memory: Cognition normal.        Judgment: Judgment normal.    Review of Systems  Constitutional: Negative.   HENT: Negative.    Eyes: Negative.   Respiratory: Negative.    Cardiovascular: Negative.   Gastrointestinal: Negative.   Musculoskeletal: Negative.   Skin: Negative.   Neurological: Negative.   Psychiatric/Behavioral: Negative.     Blood pressure 105/87, pulse 76, temperature 98.3 F (36.8 C), temperature source Oral, resp. rate 18, height 5\' 2"  (1.575 m), weight 90.7 kg, SpO2 100 %. Body mass index is 36.58 kg/m.   COGNITIVE FEATURES THAT CONTRIBUTE TO RISK:  None    SUICIDE RISK:   Minimal: No identifiable suicidal ideation.  Patients presenting with no risk factors but with morbid ruminations; may be classified as minimal risk based on the severity of the depressive symptoms  PLAN OF CARE: Supportive counseling and psychoeducation provided.  Monitoring in a safe environment.  Ongoing assessment of dangerousness.  Patient consistently denying suicidal ideation now and can be discharged home with outpatient follow-up  I certify that inpatient services furnished can reasonably be expected to improve the patient's condition.   , MD 03/22/2022, 1:52 PM

## 2022-03-22 NOTE — Progress Notes (Signed)
Recreation Therapy Notes  INPATIENT RECREATION TR PLAN  Patient Details Name: Rosell Khouri MRN: [id number] DOB: 2001-02-06 Today's Date: 03/22/2022  Rec Therapy Plan Is patient appropriate for Therapeutic Recreation?: Yes Treatment times per week: at least 3 Estimated Length of Stay: 5-7 days TR Treatment/Interventions: Group participation (Comment)  Discharge Criteria Pt will be discharged from therapy if:: Discharged Treatment plan/goals/alternatives discussed and agreed upon by:: Patient/family  Discharge Summary     Donisha Hoch 03/22/2022, 12:53 PM

## 2022-03-22 NOTE — Progress Notes (Signed)
Recreation Therapy Notes  INPATIENT RECREATION THERAPY ASSESSMENT  Patient Details Name: Madison Boyer MRN: [id number] DOB: [birth year] Today's Date: 03/22/2022       Information Obtained From: Patient  Able to Participate in Assessment/Interview: Yes  Patient Presentation: Responsive  Reason for Admission (Per Patient): Active Symptoms, Suicidal Ideation, Suicide Attempt  Patient Stressors: Family  Coping Skills:   Music, Exercise  Leisure Interests (2+):  Exercise - Walking, Music - Listen  Frequency of Recreation/Participation: Monthly  Awareness of Community Resources:  Yes  Community Resources:  Gym, Other (Comment) ([facility])  Current Use: Yes  If no, Barriers?:    Expressed Interest in [employer] Information: Yes  County of Residence:  [place]  Patient Main Form of Transportation: Car  Patient Strengths:  Listening to people. Helping others  Patient Identified Areas of Improvement:  Impulse control. Sleeping less.  Patient Goal for Hospitalization:  Better myself. Communicating with family better  Current SI (including self-harm):  No  Current HI:  No  Current AVH: No  Staff Intervention Plan: Group Attendance, Collaborate with Interdisciplinary Treatment Team  Consent to Intern Participation: N/A  Madison Boyer 03/22/2022, 12:53 PM

## 2022-03-22 NOTE — Plan of Care (Signed)
  Problem: Education: Goal: Knowledge of General Education information will improve Description: Including pain rating scale, medication(s)/side effects and non-pharmacologic comfort measures Outcome: Adequate for Discharge   Problem: Health Behavior/Discharge Planning: Goal: Ability to manage health-related needs will improve Outcome: Adequate for Discharge   Problem: Clinical Measurements: Goal: Ability to maintain clinical measurements within normal limits will improve Outcome: Adequate for Discharge Goal: Will remain free from infection Outcome: Adequate for Discharge Goal: Diagnostic test results will improve Outcome: Adequate for Discharge Goal: Respiratory complications will improve Outcome: Adequate for Discharge Goal: Cardiovascular complication will be avoided Outcome: Adequate for Discharge   Problem: Activity: Goal: Risk for activity intolerance will decrease Outcome: Adequate for Discharge   Problem: Nutrition: Goal: Adequate nutrition will be maintained Outcome: Adequate for Discharge   Problem: Coping: Goal: Level of anxiety will decrease Outcome: Adequate for Discharge   Problem: Elimination: Goal: Will not experience complications related to bowel motility Outcome: Adequate for Discharge Goal: Will not experience complications related to urinary retention Outcome: Adequate for Discharge   Problem: Pain Managment: Goal: General experience of comfort will improve Outcome: Adequate for Discharge   Problem: Safety: Goal: Ability to remain free from injury will improve 03/22/2022 1454 by Lenox Ponds, RN Outcome: Adequate for Discharge 03/22/2022 0856 by Lenox Ponds, RN Outcome: Progressing Note: Patient denied SI/HI/AVH.    Problem: Skin Integrity: Goal: Risk for impaired skin integrity will decrease Outcome: Adequate for Discharge

## 2022-03-22 NOTE — Progress Notes (Signed)
Recreation Therapy Notes  INPATIENT RECREATION TR PLAN  Patient Details Name: Madison Boyer MRN: [id number] DOB: 2000-12-08 Today's Date: 03/22/2022  Rec Therapy Plan Is patient appropriate for Therapeutic Recreation?: Yes Treatment times per week: at least 3 Estimated Length of Stay: 5-7 days TR Treatment/Interventions: Group participation (Comment)  Discharge Criteria Pt will be discharged from therapy if:: Discharged Treatment plan/goals/alternatives discussed and agreed upon by:: Patient/family  Discharge Summary Short term goals set: Patient will demonstrate improved communication skills by spontaneously contributing to 2 group discussions within 5 recreation therapy group sessions Short term goals met: Adequate for discharge Progress toward goals comments: Groups attended Which groups?: Other (Comment) (Problem Solving) Reason goals not met: N/A Therapeutic equipment acquired: N/A Reason patient discharged from therapy: Discharge from hospital Pt/family agrees with progress & goals achieved: Yes Date patient discharged from therapy: 03/22/22   Yarethzy Croak 03/22/2022, 2:22 PM

## 2022-03-22 NOTE — Group Note (Signed)
[facility] LCSW Group Therapy Note   Group Date: 03/22/2022 Start Time: 1000 End Time: 1100  Type of Therapy/Topic:  Group Therapy:  Feelings about Diagnosis  Participation Level:  Active   Description of Group:    This group will allow patients to explore their thoughts and feelings about diagnoses they have received. Patients will be guided to explore their level of understanding and acceptance of these diagnoses. Facilitator will encourage patients to process their thoughts and feelings about the reactions of others to their diagnosis, and will guide patients in identifying ways to discuss their diagnosis with significant others in their lives. This group will be process-oriented, with patients participating in exploration of their own experiences as well as giving and receiving support and challenge from other group members.   Therapeutic Goals: 1. Patient will demonstrate understanding of diagnosis as evidence by identifying two or more symptoms of the disorder:  2. Patient will be able to express two feelings regarding the diagnosis 3. Patient will demonstrate ability to communicate their needs through discussion and/or role plays  Summary of Patient Progress: Patient arrived to group later.  Patient was able to engage in discussion on how her culture has affected the way that she views mental health and diagnosis in general.  She reports that her support system has "not always been supportive", stating that they have told her that "it's all in my head".   Therapeutic Modalities:   Cognitive Behavioral Therapy Brief Therapy Feelings Identification    Harden Mo, LCSW

## 2022-03-22 NOTE — Progress Notes (Signed)
Recreation Therapy Notes   Date: 03/22/2022  Time: 1:15pm    Location: Craft room   Behavioral response: Appropriate  Intervention Topic:  Problem Solving   Discussion/Intervention:  Group content on today was focused on problem solving. The group described what problem solving is. Patients expressed how problems affect them and how they deal with problems. Individuals identified healthy ways to deal with problems. Patients explained what normally happens to them when they do not deal with problems. The group expressed reoccurring problems for them. The group participated in the intervention "Ways to Solve problems" where patients were given a chance to explore different ways to solve problems.  Clinical Observations/Feedback: Patient came to group and defined problem solving as looking for help. She expressed that problem solving is important to keep problems from adding up and going into panic mode. Individual was social with peers and staff while participating in the intervention.    Eriyanna Kofoed LRT/CTRS         Zaylin Pistilli 03/22/2022 2:19 PM        Bekka Qian 03/22/2022 2:19 PM

## 2022-03-22 NOTE — Progress Notes (Signed)
D:Patient denies SI/HI/AVH, able to contract for safety at this time. Pt appears calm and cooperative, and no distress noted.   A: All Personal items in locker returned to pt. Upon discharge.   R:  Pt States she will comply with discharge planning put into place and take MEDS as prescribed. Pt escorted out of the building by this writer/staff.  

## 2022-03-22 NOTE — BHH Counselor (Signed)
Adult Comprehensive Assessment  Patient ID: Madison Boyer, female   DOB: 01/09/2001, 21 y.o.   MRN: [id number]  Information Source: Information source: Patient (Previous PSA from 03/24/20 encounter)  Current Stressors:  Patient states their primary concerns and needs for treatment are:: "Took pills. Took Trazodone because I thought it would do something." Pt denies SI, stating that she wanted go into a deep sleep. Patient states their goals for this hospitilization and ongoing recovery are:: "My future plan, what I'm gonna do. My mom doesn't want me in the house anymore so I need to figures out what I'm going to do." Educational / Learning stressors: None reported Employment / Job issues: Recently started working at [employer] again Family Relationships: Mother is fed with pt and her continued alcohol use. Financial / Lack of resources (include bankruptcy): "Paying credit card bills" Housing / Lack of housing: Uncertain whether she can return  home or not. Physical health (include injuries & life threatening diseases): None reported Social relationships: None reported Substance abuse: She reports drinking approximately five shots on friday nights. Pt also stated that she smoked some marijuana but denied plans to continue use do to it making her paranoid. Bereavement / Loss: None reproted  Living/Environment/Situation:  Living Arrangements: Parent Living conditions (as described by patient or guardian): She states that her mother does not want her to continue bothering "her (mother's) family". Pt states she was told this when she told her mother that she was going to stay with her maternal grandmother. Who else lives in the home?: Pt, parents, three younger siblings How long has patient lived in current situation?: "All my life" What is atmosphere in current home: Comfortable  Family History:  Marital status: Single Are you sexually active?: Yes What is your sexual orientation?:  Straight Has your sexual activity been affected by drugs, alcohol, medication, or emotional stress?: Yes, whenever I take alcohol. Does patient have children?: No  Childhood History:  By whom was/is the patient raised?: Both parents Description of patient's relationship with caregiver when they were a child: "Good" Patient's description of current relationship with people who raised him/her: "It's good but me messing up and doing drugs" She expresses that her use has caused issues with her family How were you disciplined when you got in trouble as a child/adolescent?: Unable to asses Does patient have siblings?: Yes Number of Siblings: 3 Description of patient's current relationship with siblings: She stated that they get along except for the oldest brother who does not approve of her continued use. Did patient suffer any verbal/emotional/physical/sexual abuse as a child?: No Did patient suffer from severe childhood neglect?: No Has patient ever been sexually abused/assaulted/raped as an adolescent or adult?: No Was the patient ever a victim of a crime or a disaster?: No Witnessed domestic violence?: No Has patient been affected by domestic violence as an adult?: No  Education:  Highest grade of school patient has completed: Some college. 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 Plans to go back for esthetician in August. Currently a student?: No Learning disability?: No  Employment/Work Situation:   Employment Situation: Employed Where is Patient Currently Employed?: [employer] How Long has Patient Been Employed?: Less than a month Are You Satisfied With Your Job?: Yes Do You Work More Than One Job?: No Work Stressors: She shares that she worries they will fire her because she just returned to work at [employer] last month and she is back in the hospital now. Patient's Job has Been  Impacted by Current Illness: No What is the Longest Time Patient has Held a Job?: "one  year the first time" Where was the Patient Employed at that Time?: [employer] Has Patient ever Been in the [employer]?: No  Financial Resources:   Financial resources: Income from employment, Support from parents / caregiver Does patient have a representative payee or guardian?: No  Alcohol/Substance Abuse:   What has been your use of drugs/alcohol within the last 12 months?: She reported drinking at least five shots every Friday. She states that she smoke marijuana maybe every other mother or whenever she doesn't have something to do. If attempted suicide, did drugs/alcohol play a role in this?: No Alcohol/Substance Abuse Treatment Hx: Denies past history If yes, describe treatment: N/A Has alcohol/substance abuse ever caused legal problems?: No  Social Support System:   Patient's Community Support System: Poor ("I'd describe it as 'tough love'".) Describe Community Support System: Previously pt described her support system as good, however this time she states, "I don't know about that one anymore." Type of faith/religion: Catholic How does patient's faith help to cope with current illness?: "I feel like it would help me a lot to put God first. But since I started drinking/smoking I don't really go much. I think he would be upset with me."  Leisure/Recreation:   Do You Have Hobbies?: Yes Leisure and Hobbies: "I like to walk. To do make-up. Doing nails but I stopped doing that."  Strengths/Needs:   What is the patient's perception of their strengths?: "Listening to people. I like to help people out but this whole time I've been helping the wrong people." Patient states they can use these personal strengths during their treatment to contribute to their recovery: Unable to assess Patient states these barriers may affect/interfere with their treatment: none reported Patient states these barriers may affect their return to the community: Mother's decision regarding whether she can return home  or has to go elsewhere. Other important information patient would like considered in planning for their treatment: N/A  Discharge Plan:   Currently receiving community mental health services: Yes (From Whom) ([facility], CST Materials engineer SUD Counselor)) Patient states concerns and preferences for aftercare planning are: Pt states that she would like to continue with [facility]. Patient states they will know when they are safe and ready for discharge when: "When I feel like, I don't know. I'm ready now honestly." Does patient have access to transportation?: Yes Does patient have financial barriers related to discharge medications?: No Patient description of barriers related to discharge medications: N/A Will patient be returning to same living situation after discharge?: No (Uncertain around whether she can return home but is considering other family members for potential housing assistance.)  Summary/Recommendations:   Summary and Recommendations (to be completed by the evaluator): Patient is a 21 year old, single, female [place], [place] [facility] She stated that she came into the hospital because she "took pills. Took Trazodone because I thought it would do something." Pt denies suicidal intention stating that she only wanted to go into a "deep sleep". She expressed desire to figure out her future plans due to her mother telling her that she does not want pt at the house anymore. Prior to admission pt was staying with her parents but is now looking to other family members (maternal grandmother and aunt) for housing assistance. Stressors were identified as "paying credit card bills", family strife due to pt's continued use of substances, and fear of losing her job which she stated  approximately a month ago. Pt denied any neglect, abuse, or trauma. She is connected with [facility] for CST services and plans to continue with them post discharge. Recommendations include crisis stabilization, therapeutic milieu,  encourage group attendance and participation, medication management for mood stabilization, and development of comprehensive mental wellness plan. Pt is encouraged to follow through with her outpatient treatment and abstain from use of illicit substances.  Glenis Smoker. 03/22/2022

## 2022-04-21 ENCOUNTER — Encounter: Payer: Self-pay | Admitting: Nurse Practitioner

## 2022-05-03 ENCOUNTER — Telehealth: Payer: Self-pay

## 2022-05-03 NOTE — Telephone Encounter (Signed)
Left vm and sent mychart message to confirm 05/10/22 appointment-Madison Boyer

## 2022-05-10 ENCOUNTER — Encounter: Payer: Self-pay | Admitting: Nurse Practitioner

## 2022-05-10 ENCOUNTER — Ambulatory Visit (INDEPENDENT_AMBULATORY_CARE_PROVIDER_SITE_OTHER): Payer: Medicaid Other | Admitting: Nurse Practitioner

## 2022-05-10 VITALS — BP 102/60 | HR 63 | Temp 97.6°F | Resp 16 | Ht 62.0 in | Wt 210.0 lb

## 2022-05-10 DIAGNOSIS — Z6838 Body mass index (BMI) 38.0-38.9, adult: Secondary | ICD-10-CM | POA: Diagnosis not present

## 2022-05-10 DIAGNOSIS — E6609 Other obesity due to excess calories: Secondary | ICD-10-CM | POA: Diagnosis not present

## 2022-05-10 DIAGNOSIS — F251 Schizoaffective disorder, depressive type: Secondary | ICD-10-CM | POA: Diagnosis not present

## 2022-05-10 DIAGNOSIS — F1210 Cannabis abuse, uncomplicated: Secondary | ICD-10-CM

## 2022-05-10 DIAGNOSIS — Z789 Other specified health status: Secondary | ICD-10-CM

## 2022-05-10 DIAGNOSIS — Z0001 Encounter for general adult medical examination with abnormal findings: Secondary | ICD-10-CM

## 2022-05-10 NOTE — Progress Notes (Signed)
[facility] [address] [place], [place] [address]  Internal MEDICINE  Office Visit Note  Patient Name: Madison Boyer  [id number]  [id number]  Date of Service: 05/10/2022  Chief Complaint  Patient presents with   Annual Exam    HPI [name] presents for an annual well visit and physical exam.  She is a well-appearing 21 year old female with schizoaffective disorder and recent behavioral health hospitalization at [facility]. --Due for Pap but is currently on her cycle, recently turned 21 years old last month, Pap can be done with next annual physical exam --Followed by [facility] for behavioral health.  Was discharged only on trazodone for trouble sleeping but is no longer taking trazodone because she is not having any trouble sleeping. -- She had labs ordered but has not had them drawn, patient reminded to have her labs drawn at her earliest convenience --She is not currently on any medications, has no concerns, denies any pain. --Does still use cannabis occasionally --Patient has also been encouraged to decrease alcohol use.     Current Medication: Outpatient Encounter Medications as of 05/10/2022  Medication Sig   [DISCONTINUED] traZODone (DESYREL) 50 MG tablet Take by mouth.   No facility-administered encounter medications on file as of 05/10/2022.    Surgical History: Past Surgical History:  Procedure Laterality Date   APPENDECTOMY      Medical History: Past Medical History:  Diagnosis Date   Schizophrenic disorder (HCC) 2021    Family History: Family History  Problem Relation Age of Onset   Healthy Mother    Healthy Father    Healthy Maternal Grandmother    Depression Maternal Grandfather    Diabetes Maternal Grandfather    Healthy Paternal Grandmother    Healthy Paternal Grandfather     Social History   Socioeconomic History   Marital status: Single    Spouse name: Not on file   Number of children: Not on file   Years of education: Not on file    Highest education level: Not on file  Occupational History   Not on file  Tobacco Use   Smoking status: Every Day    Types: E-cigarettes   Smokeless tobacco: Never  Vaping Use   Vaping Use: Some days  Substance and Sexual Activity   Alcohol use: Not Currently    Comment: occassionally   Drug use: Yes    Types: Marijuana   Sexual activity: Yes    Birth control/protection: Condom  Other Topics Concern   Not on file  Social History Narrative   ** Merged History Encounter **       Social Determinants of Health   Financial Resource Strain: Not on file  Food Insecurity: Not on file  Transportation Needs: Not on file  Physical Activity: Not on file  Stress: Not on file  Social Connections: Not on file  Intimate Partner Violence: Not on file      Review of Systems  Constitutional:  Negative for activity change, appetite change, chills, fatigue, fever and unexpected weight change.  HENT: Negative.  Negative for congestion, ear pain, rhinorrhea, sore throat and trouble swallowing.   Eyes: Negative.   Respiratory: Negative.  Negative for cough, chest tightness, shortness of breath and wheezing.   Cardiovascular: Negative.  Negative for chest pain.  Gastrointestinal: Negative.  Negative for abdominal pain, blood in stool, constipation, diarrhea, nausea and vomiting.  Endocrine: Negative.   Genitourinary: Negative.  Negative for difficulty urinating, dysuria, frequency, hematuria and urgency.  Musculoskeletal: Negative.  Negative  for arthralgias, back pain, joint swelling, myalgias and neck pain.  Skin: Negative.  Negative for rash and wound.  Allergic/Immunologic: Negative.  Negative for immunocompromised state.  Neurological: Negative.  Negative for dizziness, seizures, numbness and headaches.  Hematological: Negative.   Psychiatric/Behavioral: Negative.  Negative for behavioral problems, self-injury and suicidal ideas. The patient is not nervous/anxious.     Vital  Signs: BP 102/60   Pulse 63   Temp 97.6 F (36.4 C)   Resp 16   Ht 5\' 2"  (1.575 m)   Wt 210 lb (95.3 kg)   SpO2 99%   BMI 38.41 kg/m    Physical Exam Vitals reviewed.  Constitutional:      General: She is not in acute distress.    Appearance: Normal appearance. She is well-developed. She is obese. She is not ill-appearing or diaphoretic.  HENT:     Head: Normocephalic and atraumatic.     Right Ear: External ear normal.     Left Ear: External ear normal.     Nose: Nose normal.     Mouth/Throat:     Pharynx: No oropharyngeal exudate.  Eyes:     General: No scleral icterus.       Right eye: No discharge.        Left eye: No discharge.     Conjunctiva/sclera: Conjunctivae normal.     Pupils: Pupils are equal, round, and reactive to light.  Neck:     Thyroid: No thyromegaly.     Vascular: No JVD.     Trachea: No tracheal deviation.  Cardiovascular:     Rate and Rhythm: Normal rate and regular rhythm.     Heart sounds: Normal heart sounds. No murmur heard.    No friction rub. No gallop.  Pulmonary:     Effort: Pulmonary effort is normal. No respiratory distress.     Breath sounds: Normal breath sounds. No stridor. No wheezing or rales.  Chest:     Chest wall: No tenderness.  Breasts:    Breasts are symmetrical.     Right: Normal. No swelling, bleeding, inverted nipple, mass, nipple discharge, skin change or tenderness.     Left: Normal. No swelling, bleeding, inverted nipple, mass, nipple discharge, skin change or tenderness.  Abdominal:     General: Bowel sounds are normal. There is no distension.     Palpations: Abdomen is soft. There is no mass.     Tenderness: There is no abdominal tenderness. There is no guarding or rebound.  Musculoskeletal:        General: No tenderness or deformity. Normal range of motion.     Cervical back: Normal range of motion and neck supple.  Lymphadenopathy:     Cervical: No cervical adenopathy.     Upper Body:     Right upper body:  No supraclavicular, axillary or pectoral adenopathy.     Left upper body: No supraclavicular, axillary or pectoral adenopathy.  Skin:    General: Skin is warm and dry.     Coloration: Skin is not pale.     Findings: No erythema or rash.  Neurological:     Mental Status: She is alert.     Cranial Nerves: No cranial nerve deficit.     Motor: No abnormal muscle tone.     Coordination: Coordination normal.     Deep Tendon Reflexes: Reflexes are normal and symmetric.  Psychiatric:        Behavior: Behavior normal.        Thought  Content: Thought content normal.        Judgment: Judgment normal.        Assessment/Plan: 1. Encounter for routine adult health examination with abnormal findings Age-appropriate preventive screenings and vaccinations discussed, annual physical exam completed. Routine labs for health maintenance ordered, reminded to have labs drawn. PHM updated.   2. Alcohol consumption of one to four drinks per week Patient counseled on alcohol consumption and complications related to alcohol use  3. Schizoaffective disorder, depressive type ([place]) Currently stable and is being followed by [facility].  Was hospitalized in the last couple of months and was not started on any medications at discharge.  4. Cannabis abuse Counseled patient on marijuana use, encourage patient to be safe if using but also encourage patient to limit use if unable to quit  5. Class 2 obesity due to excess calories without serious comorbidity with body mass index (BMI) of 38.0 to 38.9 in adult Elevated BMI of 38.4, patient encouraged to make healthy diet and lifestyle modifications.  Regular physical activity is good and will aid in weight loss.      General Counseling: Chania verbalizes understanding of the findings of todays visit and agrees with plan of treatment. I have discussed any further diagnostic evaluation that may be needed or ordered today. We also reviewed her medications today. she has  been encouraged to call the office with any questions or concerns that should arise related to todays visit.    No orders of the defined types were placed in this encounter.   No orders of the defined types were placed in this encounter.   Return in about 1 year (around 05/11/2023) for CPE/PAP, [place] PCP and otherwise as needed. .   Total time spent:30 Minutes Time spent includes review of chart, medications, test results, and follow up plan with the patient.   [place] Controlled Substance Database was reviewed by me.  This patient was seen by Jonetta Osgood, FNP-C in collaboration with Dr. Clayborn Bigness as a part of collaborative care agreement.  Fallan Mccarey R. Valetta Fuller, MSN, FNP-C Internal medicine

## 2022-07-29 ENCOUNTER — Other Ambulatory Visit (HOSPITAL_COMMUNITY)
Admission: RE | Admit: 2022-07-29 | Discharge: 2022-07-29 | Disposition: A | Discharge status: Home / Self Care | Payer: Medicaid Other | Source: Ambulatory Visit | Attending: Obstetrics | Admitting: Obstetrics

## 2022-07-29 ENCOUNTER — Ambulatory Visit (INDEPENDENT_AMBULATORY_CARE_PROVIDER_SITE_OTHER): Payer: Medicaid Other

## 2022-07-29 VITALS — BP 109/64 | HR 87 | Resp 16 | Ht 62.0 in | Wt 196.1 lb

## 2022-07-29 DIAGNOSIS — N898 Other specified noninflammatory disorders of vagina: Secondary | ICD-10-CM

## 2022-07-29 NOTE — Progress Notes (Signed)
    GYNECOLOGY PROGRESS NOTE  Subjective:    Patient ID: Madison Boyer, female    DOB: 01/04/2001, 21 y.o.   MRN: [id number]  HPI  Patient is a 21 y.o. G0P0000 female who presents for evaluation of vaginal discharge and odor x 1 week. She has been treating her symptoms with boric acid vaginal suppositories with no relief. Sent culture for all four test, patient agreed. I informed her to check her mychart for test results and comments. She verbalized understanding.

## 2022-08-01 LAB — CERVICOVAGINAL ANCILLARY ONLY
Bacterial Vaginitis (gardnerella): POSITIVE — AB
Candida Glabrata: NEGATIVE
Candida Vaginitis: NEGATIVE
Chlamydia: NEGATIVE
Comment: NEGATIVE
Comment: NEGATIVE
Comment: NEGATIVE
Comment: NEGATIVE
Comment: NEGATIVE
Comment: NORMAL
Neisseria Gonorrhea: NEGATIVE
Trichomonas: NEGATIVE

## 2022-08-03 ENCOUNTER — Encounter: Payer: Self-pay | Admitting: Obstetrics

## 2022-08-03 ENCOUNTER — Other Ambulatory Visit: Payer: Self-pay | Admitting: Obstetrics

## 2022-08-03 MED ORDER — METRONIDAZOLE 500 MG PO TABS: 500.0000 mg | ORAL_TABLET | Freq: Two times a day (BID) | ORAL | 0 refills | Status: DC

## 2022-08-03 NOTE — Progress Notes (Signed)
+   BV. Rx sent for metronidazole. Harly notified via [facility].

## 2022-08-04 ENCOUNTER — Ambulatory Visit
Admission: RE | Admit: 2022-08-04 | Discharge: 2022-08-04 | Disposition: A | Discharge status: Home / Self Care | Payer: Medicaid Other | Source: Ambulatory Visit | Attending: Family | Admitting: Family

## 2022-08-04 ENCOUNTER — Encounter: Payer: Self-pay | Admitting: Family

## 2022-08-04 ENCOUNTER — Other Ambulatory Visit: Payer: Self-pay | Admitting: Family

## 2022-08-04 ENCOUNTER — Ambulatory Visit
Admission: RE | Admit: 2022-08-04 | Discharge: 2022-08-04 | Disposition: A | Discharge status: Home / Self Care | Payer: Medicaid Other | Attending: Family | Admitting: Family

## 2022-08-04 DIAGNOSIS — S9031XA Contusion of right foot, initial encounter: Secondary | ICD-10-CM | POA: Insufficient documentation

## 2022-08-04 DIAGNOSIS — M25571 Pain in right ankle and joints of right foot: Secondary | ICD-10-CM

## 2022-08-29 ENCOUNTER — Encounter: Payer: Self-pay | Admitting: Nurse Practitioner

## 2022-08-29 ENCOUNTER — Ambulatory Visit (INDEPENDENT_AMBULATORY_CARE_PROVIDER_SITE_OTHER): Payer: Medicaid Other | Admitting: Nurse Practitioner

## 2022-08-29 VITALS — BP 149/94 | HR 100 | Temp 98.4°F | Resp 16 | Ht 62.0 in | Wt 191.2 lb

## 2022-08-29 DIAGNOSIS — R300 Dysuria: Secondary | ICD-10-CM

## 2022-08-29 DIAGNOSIS — B001 Herpesviral vesicular dermatitis: Secondary | ICD-10-CM | POA: Diagnosis not present

## 2022-08-29 DIAGNOSIS — Z113 Encounter for screening for infections with a predominantly sexual mode of transmission: Secondary | ICD-10-CM | POA: Diagnosis not present

## 2022-08-29 MED ORDER — FLUCONAZOLE 150 MG PO TABS: 150.0000 mg | ORAL_TABLET | Freq: Once | ORAL | 0 refills | Status: AC

## 2022-08-29 MED ORDER — VALACYCLOVIR HCL 500 MG PO TABS: 500.0000 mg | ORAL_TABLET | Freq: Two times a day (BID) | ORAL | 2 refills | Status: DC

## 2022-08-29 NOTE — Progress Notes (Signed)
[facility] [address] [place], [place] [address]  Internal MEDICINE  Office Visit Note  Patient Name: Madison Boyer  [id number]  [id number]  Date of Service: 09/24/2022  Chief Complaint  Patient presents with   Acute Visit     HPI Madison Boyer presents for an acute sick visit for fever blisters on lips x4-5 3 days now Asking for STD testing Possible yeast infection.     Current Medication:  Outpatient Encounter Medications as of 08/29/2022  Medication Sig   [EXPIRED] fluconazole (DIFLUCAN) 150 MG tablet Take 1 tablet (150 mg total) by mouth once for 1 dose. May take an additional dose after 3 days if still symptomatic.   valACYclovir (VALTREX) 500 MG tablet Take 1 tablet (500 mg total) by mouth 2 (two) times daily. During flare up then once daily.   [DISCONTINUED] metroNIDAZOLE (FLAGYL) 500 MG tablet Take 1 tablet (500 mg total) by mouth 2 (two) times daily.   No facility-administered encounter medications on file as of 08/29/2022.      Medical History: Past Medical History:  Diagnosis Date   Schizophrenic disorder (HCC) 2021     Vital Signs: BP (!) 149/94   Pulse 100   Temp 98.4 F (36.9 C)   Resp 16   Ht 5\' 2"  (1.575 m)   Wt 191 lb 3.2 oz (86.7 kg)   LMP 08/01/2022   SpO2 96%   BMI 34.97 kg/m    Review of Systems  Respiratory: Negative.    Cardiovascular:  Negative for chest pain and palpitations.  Genitourinary:  Positive for dysuria, pelvic pain and urgency. Negative for flank pain and frequency.       Vaginal itching    Physical Exam Vitals reviewed.  Constitutional:      General: She is not in acute distress.    Appearance: Normal appearance. She is obese. She is not ill-appearing.  HENT:     Head: Normocephalic and atraumatic.  Eyes:     Pupils: Pupils are equal, round, and reactive to light.  Cardiovascular:     Rate and Rhythm: Normal rate and regular rhythm.  Pulmonary:     Effort: Pulmonary effort is normal.  No respiratory distress.  Neurological:     Mental Status: She is alert and oriented to person, place, and time.  Psychiatric:        Mood and Affect: Mood normal.        Behavior: Behavior normal.       Assessment/Plan: 1. Fever blister Labs ordered, antiviral tx prescribed.  - HSV(herpes simplex vrs) 1+2 ab-IgG - valACYclovir (VALTREX) 500 MG tablet; Take 1 tablet (500 mg total) by mouth 2 (two) times daily. During flare up then once daily.  Dispense: 60 tablet; Refill: 2 - HSV 1 and 2 Ab, IgG - Interpretation:  2. Dysuria Tx for yeast infection, also rule out STD per patient request - Urinalysis, Routine w reflex microscopic - fluconazole (DIFLUCAN) 150 MG tablet; Take 1 tablet (150 mg total) by mouth once for 1 dose. May take an additional dose after 3 days if still symptomatic.  Dispense: 3 tablet; Refill: 0 - NuSwab Vaginitis Plus (VG+) - Microscopic Examination  3. Encounter for screening for infections with predominantly sexual mode of transmission Ordered labs as requested by patient - NuSwab Vaginitis Plus (VG+) - STI Profile   General Counseling: Madison Boyer verbalizes understanding of the findings of todays visit and agrees with plan of treatment. I have discussed any further diagnostic evaluation that may be  needed or ordered today. We also reviewed her medications today. she has been encouraged to call the office with any questions or concerns that should arise related to todays visit.    Counseling:    Orders Placed This Encounter  Procedures   Microscopic Examination   HSV(herpes simplex vrs) 1+2 ab-IgG   Urinalysis, Routine w reflex microscopic   NuSwab Vaginitis Plus (VG+)   STI Profile   HSV 1 and 2 Ab, IgG   Interpretation:    Meds ordered this encounter  Medications   valACYclovir (VALTREX) 500 MG tablet    Sig: Take 1 tablet (500 mg total) by mouth 2 (two) times daily. During flare up then once daily.    Dispense:  60 tablet    Refill:  2    fluconazole (DIFLUCAN) 150 MG tablet    Sig: Take 1 tablet (150 mg total) by mouth once for 1 dose. May take an additional dose after 3 days if still symptomatic.    Dispense:  3 tablet    Refill:  0    Return if symptoms worsen or fail to improve.  [place] Controlled Substance Database was reviewed by me for overdose risk score (ORS)  Time spent:30 Minutes Time spent with patient included reviewing progress notes, labs, imaging studies, and discussing plan for follow up.   This patient was seen by Jonetta Osgood, FNP-C in collaboration with Dr. Clayborn Bigness as a part of collaborative care agreement.  Jewelle Whitner R. Valetta Fuller, MSN, FNP-C Internal Medicine

## 2022-08-30 ENCOUNTER — Ambulatory Visit: Payer: Medicaid Other | Admitting: Nurse Practitioner

## 2022-08-30 LAB — MICROSCOPIC EXAMINATION: Casts: NONE SEEN /lpf

## 2022-08-30 LAB — STI PROFILE
HCV Ab: NONREACTIVE
HIV Screen 4th Generation wRfx: NONREACTIVE
Hep B Core Total Ab: NEGATIVE
Hep B Surface Ab, Qual: NONREACTIVE
Hepatitis B Surface Ag: NEGATIVE
RPR Ser Ql: NONREACTIVE

## 2022-08-30 LAB — URINALYSIS, ROUTINE W REFLEX MICROSCOPIC
Bilirubin, UA: NEGATIVE
Glucose, UA: NEGATIVE
Leukocytes,UA: NEGATIVE
Nitrite, UA: NEGATIVE
Protein,UA: NEGATIVE
Specific Gravity, UA: 1.023 (ref 1.005–1.030)
Urobilinogen, Ur: 0.2 mg/dL (ref 0.2–1.0)
pH, UA: 6 (ref 5.0–7.5)

## 2022-08-30 LAB — HSV 1 AND 2 AB, IGG
HSV 1 Glycoprotein G Ab, IgG: <0.91 index (ref 0.00–0.90)
HSV 2 IgG, Type Spec: <0.91 index (ref 0.00–0.90)

## 2022-08-30 LAB — HCV INTERPRETATION

## 2022-08-31 LAB — NUSWAB VAGINITIS PLUS (VG+)
Candida albicans, NAA: NEGATIVE
Candida glabrata, NAA: NEGATIVE
Chlamydia trachomatis, NAA: NEGATIVE
Neisseria gonorrhoeae, NAA: NEGATIVE
Trich vag by NAA: NEGATIVE

## 2022-09-02 ENCOUNTER — Encounter: Payer: Self-pay | Admitting: Medical

## 2022-09-02 ENCOUNTER — Ambulatory Visit (INDEPENDENT_AMBULATORY_CARE_PROVIDER_SITE_OTHER): Payer: Medicaid Other | Admitting: Medical

## 2022-09-02 VITALS — BP 122/70 | Ht 62.0 in | Wt 192.0 lb

## 2022-09-02 DIAGNOSIS — R102 Pelvic and perineal pain: Secondary | ICD-10-CM

## 2022-09-02 LAB — POCT URINALYSIS DIPSTICK
Bilirubin, UA: NEGATIVE
Blood, UA: NEGATIVE
Glucose, UA: NEGATIVE
Ketones, UA: NEGATIVE
Leukocytes, UA: NEGATIVE
Nitrite, UA: NEGATIVE
Protein, UA: NEGATIVE
Spec Grav, UA: 1.015 (ref 1.010–1.025)
Urobilinogen, UA: 0.2 E.U./dL
pH, UA: 7 (ref 5.0–8.0)

## 2022-09-02 NOTE — Progress Notes (Signed)
History:  Ms. Madison Boyer is a 21 y.o. G0P0000 who presents to clinic today for pelvic pain x 2 weeks. Pain is midline, intermittent and rated at 6/10 at the worst. She denies aggravating factors. She has not tried anything for pain. Pain is sharp in nature. She denies pain now. She denies vaginal discharge, bleeding, UTI symptoms or fever. She has chronic nausea with no change, denies vomiting. She states loose stools throughout the day a few days ago, but no diarrhea or constipation.  LMP 08/19/22, last sex ~ 2 weeks ago.   The following portions of the patient's history were reviewed and updated as appropriate: allergies, current medications, family history, past medical history, social history, past surgical history and problem list.  Review of Systems:  Review of Systems  Constitutional:  Negative for fever, malaise/fatigue and weight loss.  Gastrointestinal:  Positive for abdominal pain. Negative for constipation, diarrhea, nausea and vomiting.  Genitourinary:  Negative for dysuria, frequency and urgency.       Neg - vaginal bleeding, discharge      Objective:  Physical Exam BP 122/70   Ht 5\' 2"  (1.575 m)   Wt 192 lb (87.1 kg)   LMP 08/19/2022 (Exact Date)   BMI 35.12 kg/m  Physical Exam Vitals and nursing note reviewed. Exam conducted with a chaperone present.  Constitutional:      General: She is not in acute distress.    Appearance: Normal appearance. She is well-developed and normal weight.  HENT:     Head: Normocephalic and atraumatic.  Cardiovascular:     Rate and Rhythm: Normal rate.  Pulmonary:     Effort: Pulmonary effort is normal.  Abdominal:     General: Abdomen is flat. There is no distension.     Palpations: Abdomen is soft. There is no mass.     Tenderness: There is no abdominal tenderness. There is no guarding or rebound.  Genitourinary:    General: Normal vulva.     Uterus: Tender (mild). Not enlarged.      Adnexa:        Right:  Tenderness (mild) present. No mass or fullness.         Left: Tenderness (mild) present. No mass or fullness.    Skin:    General: Skin is warm and dry.     Findings: No erythema.  Neurological:     Mental Status: She is alert and oriented to person, place, and time.  Psychiatric:        Mood and Affect: Mood normal.     Labs and Imaging Results for orders placed or performed in visit on 09/02/22 (from the past 24 hour(s))  POCT Urinalysis Dipstick     Status: Normal   Collection Time: 09/02/22  8:43 AM  Result Value Ref Range   Color, UA yellow    Clarity, UA clear    Glucose, UA Negative Negative   Bilirubin, UA neg    Ketones, UA neg    Spec Grav, UA 1.015 1.010 - 1.025   Blood, UA neg    pH, UA 7.0 5.0 - 8.0   Protein, UA Negative Negative   Urobilinogen, UA 0.2 0.2 or 1.0 E.U./dL   Nitrite, UA neg    Leukocytes, UA Negative Negative   Appearance     Odor      Health Maintenance Due  Topic Date Due   PAP-Cervical Cytology Screening  Never done   PAP SMEAR-Modifier  Never done   INFLUENZA  VACCINE  Never done    Labs, imaging and previous visits in Epic and Care Everywhere reviewed  Assessment & Plan:  1. Pelvic pain - Chart review shows patient had negative testings for all vaginal infections with PCP on 08/29/22 - POCT Urinalysis Dipstick - negative  - US Pelvis Complete; ordered  - Discussed ibuprofen for pain PRN and how to take OTC  - Advised we will follow-up after [place] results   Marny Lowenstein, PA-C 09/02/2022 10:25 AM

## 2022-09-06 ENCOUNTER — Ambulatory Visit: Payer: Medicaid Other | Admitting: Nurse Practitioner

## 2022-09-24 ENCOUNTER — Encounter: Payer: Self-pay | Admitting: Nurse Practitioner

## 2022-09-30 ENCOUNTER — Ambulatory Visit (INDEPENDENT_AMBULATORY_CARE_PROVIDER_SITE_OTHER): Payer: Medicaid Other

## 2022-09-30 ENCOUNTER — Other Ambulatory Visit: Payer: Self-pay | Admitting: Medical

## 2022-09-30 DIAGNOSIS — R102 Pelvic and perineal pain: Secondary | ICD-10-CM

## 2023-01-27 ENCOUNTER — Encounter: Payer: Medicaid Other | Admitting: Licensed Practical Nurse

## 2023-05-16 ENCOUNTER — Encounter: Payer: MEDICAID | Admitting: Nurse Practitioner

## 2023-05-16 ENCOUNTER — Ambulatory Visit: Payer: MEDICAID | Admitting: Nurse Practitioner

## 2023-06-01 ENCOUNTER — Encounter: Payer: Self-pay | Admitting: Nurse Practitioner

## 2023-06-01 ENCOUNTER — Ambulatory Visit (INDEPENDENT_AMBULATORY_CARE_PROVIDER_SITE_OTHER): Payer: MEDICAID | Admitting: Nurse Practitioner

## 2023-06-01 VITALS — BP 128/72 | HR 87 | Temp 98.9°F | Resp 16 | Ht 62.0 in | Wt 198.6 lb

## 2023-06-01 DIAGNOSIS — Z0001 Encounter for general adult medical examination with abnormal findings: Secondary | ICD-10-CM | POA: Diagnosis not present

## 2023-06-01 DIAGNOSIS — Z113 Encounter for screening for infections with a predominantly sexual mode of transmission: Secondary | ICD-10-CM

## 2023-06-01 DIAGNOSIS — E538 Deficiency of other specified B group vitamins: Secondary | ICD-10-CM

## 2023-06-01 DIAGNOSIS — E782 Mixed hyperlipidemia: Secondary | ICD-10-CM

## 2023-06-01 DIAGNOSIS — L2082 Flexural eczema: Secondary | ICD-10-CM

## 2023-06-01 DIAGNOSIS — D751 Secondary polycythemia: Secondary | ICD-10-CM

## 2023-06-01 DIAGNOSIS — R7301 Impaired fasting glucose: Secondary | ICD-10-CM

## 2023-06-01 DIAGNOSIS — Z124 Encounter for screening for malignant neoplasm of cervix: Secondary | ICD-10-CM

## 2023-06-01 DIAGNOSIS — Z119 Encounter for screening for infectious and parasitic diseases, unspecified: Secondary | ICD-10-CM

## 2023-06-01 MED ORDER — VALACYCLOVIR HCL 500 MG PO TABS: 500.0000 mg | ORAL_TABLET | Freq: Two times a day (BID) | ORAL | 2 refills | Status: DC

## 2023-06-01 MED ORDER — TRIAMCINOLONE ACETONIDE 0.1 % EX CREA: 1.0000 | TOPICAL_CREAM | Freq: Two times a day (BID) | CUTANEOUS | 3 refills | Status: DC | PRN
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
## 2023-06-06 ENCOUNTER — Telehealth: Payer: Self-pay

## 2023-06-06 ENCOUNTER — Other Ambulatory Visit: Payer: Self-pay | Admitting: Nurse Practitioner

## 2023-06-06 DIAGNOSIS — N760 Acute vaginitis: Secondary | ICD-10-CM

## 2023-06-06 LAB — NUSWAB VAGINITIS PLUS (VG+)
Atopobium vaginae: HIGH {score} — AB
BVAB 2: HIGH Score — AB
Candida albicans, NAA: NEGATIVE
Candida glabrata, NAA: NEGATIVE
Chlamydia trachomatis, NAA: NEGATIVE
Megasphaera 1: HIGH {score} — AB
Neisseria gonorrhoeae, NAA: NEGATIVE
Trich vag by NAA: NEGATIVE

## 2023-06-06 MED ORDER — METRONIDAZOLE 500 MG PO TABS: 500.0000 mg | ORAL_TABLET | Freq: Two times a day (BID) | ORAL | 0 refills | Status: AC

## 2023-06-06 NOTE — Telephone Encounter (Signed)
Pt.notified

## 2023-06-06 NOTE — Telephone Encounter (Signed)
-----   Message from [facility] sent at 06/06/2023  7:54 AM EDT ----- Please let patient know that she has bacterial vaginosis. I have ordered metronidazole 500 mg twice daily x7 days. It is at her pharmacy. Please avoid alcohol while taking this medication.

## 2023-06-06 NOTE — Progress Notes (Signed)
Please let patient know that she has bacterial vaginosis. I have ordered metronidazole 500 mg twice daily x7 days. It is at her pharmacy. Please avoid alcohol while taking this medication.

## 2023-06-07 LAB — IGP, APTIMA HPV: HPV Aptima: NEGATIVE

## 2023-06-07 NOTE — Progress Notes (Signed)
This encounter was created in error - please disregard.

## 2023-06-07 NOTE — Progress Notes (Deleted)
Entered in error

## 2023-07-12 ENCOUNTER — Telehealth: Payer: Self-pay

## 2023-07-12 MED ORDER — METRONIDAZOLE 0.75 % VA GEL: 1.0000 | Freq: Every day | VAGINAL | 0 refills | Status: AC

## 2023-07-12 NOTE — Telephone Encounter (Signed)
Pt advised we sent

## 2023-09-08 ENCOUNTER — Encounter: Payer: Self-pay | Admitting: Nurse Practitioner

## 2023-09-08 ENCOUNTER — Ambulatory Visit (INDEPENDENT_AMBULATORY_CARE_PROVIDER_SITE_OTHER): Payer: MEDICAID | Admitting: Nurse Practitioner

## 2023-09-08 VITALS — BP 120/70 | HR 86 | Temp 98.5°F | Resp 16 | Ht 62.0 in | Wt 193.0 lb

## 2023-09-08 DIAGNOSIS — N3001 Acute cystitis with hematuria: Secondary | ICD-10-CM | POA: Diagnosis not present

## 2023-09-08 DIAGNOSIS — R300 Dysuria: Secondary | ICD-10-CM | POA: Diagnosis not present

## 2023-09-08 DIAGNOSIS — Z113 Encounter for screening for infections with a predominantly sexual mode of transmission: Secondary | ICD-10-CM | POA: Diagnosis not present

## 2023-09-08 LAB — POCT URINALYSIS DIPSTICK
Bilirubin, UA: NEGATIVE
Blood, UA: POSITIVE
Glucose, UA: NEGATIVE
Ketones, UA: POSITIVE
Nitrite, UA: NEGATIVE
Protein, UA: NEGATIVE
Spec Grav, UA: 1.01 (ref 1.010–1.025)
Urobilinogen, UA: 0.2 U/dL
pH, UA: 5 (ref 5.0–8.0)

## 2023-09-08 MED ORDER — NITROFURANTOIN MONOHYD MACRO 100 MG PO CAPS: 100.0000 mg | ORAL_CAPSULE | Freq: Two times a day (BID) | ORAL | 0 refills | Status: AC

## 2023-09-08 NOTE — Progress Notes (Signed)
[facility] [address] [place], [place] [address]  Internal MEDICINE  Office Visit Note  Patient Name: Madison Boyer  [id number]  [id number]  Date of Service: 09/08/2023  Chief Complaint  Patient presents with   Acute Visit   Vaginal Pain    Patient is concerned of possible UTI, yeast infection or STD. Had some severe cramping yesterday. Gave patient urine cup for dipstick and NuSwab.     Vaginal Pain Associated symptoms include dysuria, frequency, hematuria and urgency. Pertinent negatives include no diarrhea, nausea or vomiting.   Madison Boyer presents for an acute sick visit for possible UTI --onset of symptoms was last week --reports vaginal burning, frequency, urgency, suprapubic tenderness, fatigue --denies any fever, chills, or back pain.  --urinalysis was positive for blood and large leukocytes, negative for nitrites.  Will also test for STD, yeast and BV.    Current Medication:  Outpatient Encounter Medications as of 09/08/2023  Medication Sig   nitrofurantoin, macrocrystal-monohydrate, (MACROBID) 100 MG capsule Take 1 capsule (100 mg total) by mouth 2 (two) times daily for 7 days. Take with food   triamcinolone cream (KENALOG) 0.1 % Apply 1 Application topically 2 (two) times daily as needed (eczema/rash).   valACYclovir (VALTREX) 500 MG tablet Take 1 tablet (500 mg total) by mouth 2 (two) times daily. During flare ups.   No facility-administered encounter medications on file as of 09/08/2023.      Medical History: Past Medical History:  Diagnosis Date   Schizophrenic disorder (HCC) 2021     Vital Signs: BP 120/70   Pulse 86   Temp 98.5 F (36.9 C)   Resp 16   Ht 5\' 2"  (1.575 m)   Wt 193 lb (87.5 kg)   SpO2 99%   BMI 35.30 kg/m    Review of Systems  Constitutional:  Positive for fatigue.  Respiratory: Negative.  Negative for cough, chest tightness, shortness of breath and wheezing.   Cardiovascular: Negative.  Negative for  chest pain and palpitations.  Gastrointestinal: Negative.  Negative for diarrhea, nausea and vomiting.  Genitourinary:  Positive for dysuria, frequency, hematuria, urgency and vaginal pain.    Physical Exam Vitals reviewed.  Constitutional:      General: She is not in acute distress.    Appearance: Normal appearance. She is obese. She is not ill-appearing.  HENT:     Head: Normocephalic and atraumatic.  Eyes:     Pupils: Pupils are equal, round, and reactive to light.  Cardiovascular:     Rate and Rhythm: Normal rate.     Heart sounds: Normal heart sounds. No murmur heard. Pulmonary:     Effort: Pulmonary effort is normal. No respiratory distress.  Neurological:     Mental Status: She is alert and oriented to person, place, and time.  Psychiatric:        Mood and Affect: Mood normal.        Behavior: Behavior normal.       Assessment/Plan: 1. Acute cystitis with hematuria Urine culture sent. Macrobid prescribed, take until gone.  - CULTURE, URINE COMPREHENSIVE - nitrofurantoin, macrocrystal-monohydrate, (MACROBID) 100 MG capsule; Take 1 capsule (100 mg total) by mouth 2 (two) times daily for 7 days. Take with food  Dispense: 14 capsule; Refill: 0  2. Dysuria Urinalysis positive for leukocytes and blood, urine culture sent  - POCT Urinalysis Dipstick  3. Screening for STDs (sexually transmitted diseases) Vaginal swab sent  - NuSwab Vaginitis Plus (VG+)   General Counseling: Madison Boyer verbalizes understanding  of the findings of todays visit and agrees with plan of treatment. I have discussed any further diagnostic evaluation that may be needed or ordered today. We also reviewed her medications today. she has been encouraged to call the office with any questions or concerns that should arise related to todays visit.    Counseling:    Orders Placed This Encounter  Procedures   CULTURE, URINE COMPREHENSIVE   NuSwab Vaginitis Plus (VG+)   POCT Urinalysis Dipstick     Meds ordered this encounter  Medications   nitrofurantoin, macrocrystal-monohydrate, (MACROBID) 100 MG capsule    Sig: Take 1 capsule (100 mg total) by mouth 2 (two) times daily for 7 days. Take with food    Dispense:  14 capsule    Refill:  0    Fill new script today    Return if symptoms worsen or fail to improve.  Madison Boyer Controlled Substance Database was reviewed by me for overdose risk score (ORS)  Time spent:30 Minutes Time spent with patient included reviewing progress notes, labs, imaging studies, and discussing plan for follow up.   This patient was seen by Madison Kuster, FNP-C in collaboration with Madison Boyer as a part of collaborative care agreement.  Madison Ruben R. Tedd Sias, MSN, FNP-C Internal Medicine

## 2023-09-11 LAB — CULTURE, URINE COMPREHENSIVE

## 2023-09-13 LAB — NUSWAB VAGINITIS PLUS (VG+)
Chlamydia trachomatis, NAA: NEGATIVE
Neisseria gonorrhoeae, NAA: NEGATIVE
Trich vag by NAA: NEGATIVE

## 2023-10-13 ENCOUNTER — Telehealth: Payer: Self-pay | Admitting: Nurse Practitioner
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
## 2023-10-18 ENCOUNTER — Ambulatory Visit (INDEPENDENT_AMBULATORY_CARE_PROVIDER_SITE_OTHER): Payer: MEDICAID

## 2023-10-18 VITALS — BP 129/60 | HR 61 | Ht 62.0 in | Wt 193.0 lb

## 2023-10-18 DIAGNOSIS — Z3201 Encounter for pregnancy test, result positive: Secondary | ICD-10-CM

## 2023-10-18 DIAGNOSIS — Z3200 Encounter for pregnancy test, result unknown: Secondary | ICD-10-CM

## 2023-10-18 DIAGNOSIS — Z3687 Encounter for antenatal screening for uncertain dates: Secondary | ICD-10-CM

## 2023-10-18 LAB — POCT URINE PREGNANCY: Preg Test, Ur: POSITIVE — AB

## 2023-10-18 NOTE — Progress Notes (Signed)
    NURSE VISIT NOTE  Subjective:    Patient ID: Madison Boyer, female    DOB: Jul 01, 2001, 23 y.o.   MRN: [id number]  HPI  Patient is a 23 y.o. G59P0000 female who presents for evaluation of amenorrhea. She believes she could be pregnant.  Pregancy was not planned but is desired   . Current symptoms also include: fatigue, frequent urination, and positive home pregnancy test. Last period was normal but uncertain of dates.Placed US  order as dates [place] and may be 08/03/23 she would be 10w6 days today.     Objective:    BP 129/60   Pulse 61   Ht 5\' 2"  (1.575 m)   Wt 193 lb (87.5 kg)   LMP 08/03/2023   BMI 35.30 kg/m   Lab Review  Results for orders placed or performed in visit on 10/18/23  POCT urine pregnancy  Result Value Ref Range   Preg Test, Ur Positive (A) Negative    Assessment:   1. Possible pregnancy, not confirmed   2. Encounter for antenatal screening for uncertain dates     Plan:   Pregnancy Test: Positive  Encouraged well-balanced diet, plenty of rest when needed, pre-natal vitamins daily and walking for exercise.  Discussed self-help for nausea, avoiding OTC medications until consulting provider or pharmacist, other than Tylenol  as needed, minimal caffeine (1-2 cups daily) and avoiding alcohol.   She will schedule her nurse visit @ 7-[redacted] wks pregnant, u/s for dating @10  wk, and NOB visit at [redacted] wk pregnant.    Feel free to call with any questions.     Alishia Lebo H Jivan Symanski, CMA

## 2023-10-30 ENCOUNTER — Telehealth (INDEPENDENT_AMBULATORY_CARE_PROVIDER_SITE_OTHER): Payer: MEDICAID

## 2023-10-30 DIAGNOSIS — Z3689 Encounter for other specified antenatal screening: Secondary | ICD-10-CM

## 2023-10-30 DIAGNOSIS — Z3490 Encounter for supervision of normal pregnancy, unspecified, unspecified trimester: Secondary | ICD-10-CM | POA: Insufficient documentation

## 2023-10-30 DIAGNOSIS — Z3480 Encounter for supervision of other normal pregnancy, unspecified trimester: Secondary | ICD-10-CM | POA: Insufficient documentation

## 2023-10-30 NOTE — Progress Notes (Signed)
New OB Intake  I connected with  Madison Boyer on 10/30/23 at  2:15 PM EST by MyChart Video Visit and verified that I am speaking with the correct person using two identifiers. Nurse is located at [facility] and pt is located at home.  I discussed the limitations, risks, security and privacy concerns of performing an evaluation and management service by telephone and the availability of in person appointments. I also discussed with the patient that there may be a patient responsible charge related to this service. The patient expressed understanding and agreed to proceed.  I explained I am completing New OB Intake today. We discussed her EDD of 05/09/2024 that is based on LMP of 08/03/2023. Pt is G1/P0. I reviewed her allergies, medications, Medical/Surgical/OB history, and appropriate screenings. There are cats in the home in the home  yes If yes Outdoor Based on history, this is a/an pregnancy uncomplicated . Her obstetrical history is significant for  none, patient states that she was a former marijuana smoker but states that she quit smoking once she found out she was pregnant .  Patient Active Problem List   Diagnosis Date Noted   Acute cystitis with hematuria 09/08/2023   Overdose of antidepressant 03/22/2022   Medication overdose    Acute psychosis (HCC) 03/25/2020   Cannabis abuse 03/25/2020   Schizoaffective disorder (HCC) 02/15/2020   Dysmenorrhea     Concerns addressed today  Delivery Plans:  Plans to deliver at [facility]  Anatomy US Explained first scheduled [place] will be around 19 weeks. First ultrasound is scheduled for 11/08/23 at 4PM.  Labs Discussed genetic screening with patient. Patient will be getting genetic testing to be drawn at new OB visit. Discussed possible labs to be drawn at new OB appointment.  COVID Vaccine Patient has not had COVID vaccine.   Social Determinants of Health Food Insecurity: expresses food insecurity. Information  given on local food banks. WIC Referral: Patient is interested in referral to [facility].  Transportation: Patient expressed transportation needs. Childcare: Discussed no children allowed at ultrasound appointments.   First visit review I reviewed new OB appt with pt. I explained she will have blood work and pap smear/pelvic exam if indicated. Explained pt will be seen by Guadlupe Spanish at first visit 11/13/2023 at 2:55PM; encounter routed to appropriate provider.  Advised patient that I will forward over her information to Jola Schmidt from [facility] to address food and transporation needs.   Fonda Kinder, CMA 10/30/2023  2:04 PM

## 2023-10-30 NOTE — Patient Instructions (Signed)
First Trimester of Pregnancy  The first trimester of pregnancy starts on the first day of your last monthly period until the end of week 13. This is months 1 through 3 of pregnancy. A week after a sperm fertilizes an egg, the egg will implant into the wall of the uterus and begin to develop into a baby. Body changes during your first trimester Your body goes through many changes during pregnancy. The changes usually return to normal after your baby is born. Physical changes Your breasts may grow larger and may hurt. The area around your nipples may get darker. Your periods will stop. Your hair and nails may grow faster. You may pee more often. Health changes You may tire easily. Your gums may bleed and may be sensitive when you brush and floss. You may not feel hungry. You may have heartburn. You may throw up or feel like you may throw up. You may want to eat some foods, but not others. You may have headaches. You may have trouble pooping (constipation). Other changes Your emotions may change from day to day. You may have more dreams. Follow these instructions at home: Medicines Talk to your health care provider if you're taking medicines. Ask if the medicines are safe to take during pregnancy. Your provider may change the medicines that you take. Do not take any medicines unless told to by your provider. Take a prenatal vitamin that has at least 600 micrograms (mcg) of folic acid. Do not use herbal medicines, illegal substances, or medicines that are not approved by your provider. Eating and drinking While you're pregnant your body needs extra food for your growing baby. Talk with your provider about what to eat while pregnant. Activity Most women are able to exercise during pregnancy. Exercises may need to change as your pregnancy goes on. Talk to your provider about your activities and exercise routines. Relieving pain and discomfort Wear a good, supportive bra if your breasts  hurt. Rest with your legs raised if you have leg cramps or low back pain. Safety Wear your seatbelt at all times when you're in a car. Talk to your provider if someone hits you, hurts you, or yells at you. Talk with your provider if you're feeling sad or have thoughts of hurting yourself. Lifestyle Certain things can be harmful while you're pregnant. Follow these rules: Do not use hot tubs, steam rooms, or saunas. Do not douche. Do not use tampons or scented pads. Do not drink alcohol,smoke, vape, or use products with nicotine or tobacco in them. If you need help quitting, talk with your provider. Avoid cat litter boxes and soil used by cats. These things carry germs that can cause harm to your pregnancy and your baby. General instructions Keep all follow-up visits. It helps you and your unborn baby stay as healthy as possible. Write down your questions. Take them to your visits. Your provider will: Talk with you about your overall health. Give you advice or refer you to specialists who can help with different needs, including: Prenatal education classes. Mental health and counseling. Foods and healthy eating. Ask for help if you need help with food. Call your dentist and ask to be seen. Brush your teeth with a soft toothbrush. Floss gently. Where to find more information American Pregnancy Association: americanpregnancy.org [employer]: acog.org Office on [employer] Health: [employer] Contact a health care provider if: You feel dizzy, faint, or have a fever. You vomit or have watery poop (diarrhea) for 2  days or more. You have abnormal discharge or bleeding from your vagina. You have pain when you pee or your pee smells bad. You have cramps, pain, or pressure in your belly area. Get help right away if: You have trouble breathing or chest pain. You have any kind of injury, such as from a fall or a car crash. These symptoms may be an  emergency. Get help right away. Call 911. Do not wait to see if the symptoms will go away. Do not drive yourself to the hospital. This information is not intended to replace advice given to you by your health care provider. Make sure you discuss any questions you have with your health care provider. Document Revised: 06/22/2023 Document Reviewed: 01/20/2023 Elsevier Patient Education  2024 Elsevier Inc.   Common Medications Safe in Pregnancy  Acne:      Constipation:  Benzoyl Peroxide     Colace  Clindamycin      Dulcolax Suppository  Topica Erythromycin     Fibercon  Salicylic Acid      Metamucil         Miralax AVOID:        Senakot   Accutane    Cough:  Retin-A       Cough Drops  Tetracycline      Phenergan w/ Codeine if Rx  Minocycline      Robitussin (Plain & DM)  Antibiotics:     Crabs/Lice:  Ceclor       RID  Cephalosporins    AVOID:  E-Mycins      Kwell  Keflex  Macrobid/Macrodantin   Diarrhea:  Penicillin      Kao-Pectate  Zithromax      Imodium AD         PUSH FLUIDS AVOID:       Cipro     Fever:  Tetracycline      Tylenol (Regular or Extra  Minocycline       Strength)  Levaquin      Extra Strength-Do not          Exceed 8 tabs/24 hrs Caffeine:        200mg /day (equiv. To 1 cup of coffee or  approx. 3 12 oz sodas)         Gas: Cold/Hayfever:       Gas-X  Benadryl      Mylicon  Claritin       Phazyme  **Claritin-D        Chlor-Trimeton    Headaches:  Dimetapp      ASA-Free Excedrin  Drixoral-Non-Drowsy     Cold Compress  Mucinex (Guaifenasin)     Tylenol (Regular or Extra  Sudafed/Sudafed-12 Hour     Strength)  **Sudafed PE Pseudoephedrine   Tylenol Cold & Sinus     Vicks Vapor Rub  Zyrtec  **AVOID if Problems With Blood Pressure         Heartburn: Avoid lying down for at least 1 hour after meals  Aciphex      Maalox     Rash:  Milk of Magnesia     Benadryl    Mylanta       1% Hydrocortisone Cream  Pepcid  Pepcid Complete   Sleep  Aids:  Prevacid      Ambien   Prilosec       Benadryl  Rolaids       Chamomile Tea  Tums (Limit 4/day)     Unisom  Tylenol PM         Warm milk-add vanilla or  Hemorrhoids:       Sugar for taste  Anusol/Anusol H.C.  (RX: Analapram 2.5%)  Sugar Substitutes:  Hydrocortisone OTC     Ok in moderation  Preparation H      Tucks        Vaseline lotion applied to tissue with wiping    Herpes:     Throat:  Acyclovir      Oragel  Famvir  Valtrex     Vaccines:         Flu Shot Leg Cramps:       *Gardasil  Benadryl      Hepatitis A         Hepatitis B Nasal Spray:       Pneumovax  Saline Nasal Spray     Polio Booster         Tetanus Nausea:       Tuberculosis test or PPD  Vitamin B6 25 mg TID   AVOID:    Dramamine      *Gardasil  Emetrol       Live Poliovirus  Ginger Root 250 mg QID    MMR (measles, mumps &  High Complex Carbs @ Bedtime    rebella)  Sea Bands-Accupressure    Varicella (Chickenpox)  Unisom 1/2 tab TID     *No known complications           If received before Pain:         Known pregnancy;   Darvocet       Resume series after  Lortab        Delivery  Percocet    Yeast:   Tramadol      Femstat  Tylenol 3      Gyne-lotrimin  Ultram       Monistat  Vicodin           MISC:         All Sunscreens           Hair Coloring/highlights          Insect Repellant's          (Including DEET)         Mystic Tans   Commonly Asked Questions During Pregnancy   Cats: A parasite can be excreted in cat feces.  To avoid exposure you need to have another person empty the little box.  If you must empty the litter box you will need to wear gloves.  Wash your hands after handling your cat.  This parasite can also be found in raw or undercooked meat so this should also be avoided.  Colds, Sore Throats, Flu: Please check your medication sheet to see what you can take for symptoms.  If your symptoms are unrelieved by these medications please call the office.  Dental Work: Most  any dental work Agricultural consultant recommends is permitted.  X-rays should only be taken during the first trimester if absolutely necessary.  Your abdomen should be shielded with a lead apron during all x-rays.  Please notify your provider prior to receiving any x-rays.  Novocaine is fine; gas is not recommended.  If your dentist requires a note from [place] prior to dental work please call the office and we will provide one for you.  Exercise: Exercise is an important part of staying healthy during your pregnancy.  You may continue most exercises you were accustomed to prior to pregnancy.  Later in your pregnancy you will most likely notice you have difficulty with activities requiring balance like riding a bicycle.  It is important that you listen to your body and avoid activities that put you at a higher risk of falling.  Adequate rest and staying well hydrated are a must!  If you have questions about the safety of specific activities ask your provider.    Exposure to Children with illness: Try to avoid obvious exposure; report any symptoms to [place] when noted,  If you have chicken pos, red measles or mumps, you should be immune to these diseases.   Please do not take any vaccines while pregnant unless you have checked with your OB provider.  Fetal Movement: After 28 weeks we recommend you do "kick counts" twice daily.  Lie or sit down in a calm quiet environment and count your baby movements "kicks".  You should feel your baby at least 10 times per hour.  If you have not felt 10 kicks within the first hour get up, walk around and have something sweet to eat or drink then repeat for an additional hour.  If count remains less than 10 per hour notify your provider.  Fumigating: Follow your pest control agent's advice as to how long to stay out of your home.  Ventilate the area well before re-entering.  Hemorrhoids:   Most over-the-counter preparations can be used during pregnancy.  Check your medication to see what is  safe to use.  It is important to use a stool softener or fiber in your diet and to drink lots of liquids.  If hemorrhoids seem to be getting worse please call the office.   Hot Tubs:  Hot tubs Jacuzzis and saunas are not recommended while pregnant.  These increase your internal body temperature and should be avoided.  Intercourse:  Sexual intercourse is safe during pregnancy as long as you are comfortable, unless otherwise advised by your provider.  Spotting may occur after intercourse; report any bright red bleeding that is heavier than spotting.  Labor:  If you know that you are in labor, please go to the hospital.  If you are unsure, please call the office and let us help you decide what to do.  Lifting, straining, etc:  If your job requires heavy lifting or straining please check with your provider for any limitations.  Generally, you should not lift items heavier than that you can lift simply with your hands and arms (no back muscles)  Painting:  Paint fumes do not harm your pregnancy, but may make you ill and should be avoided if possible.  Latex or water based paints have less odor than oils.  Use adequate ventilation while painting.  Permanents & Hair Color:  Chemicals in hair dyes are not recommended as they cause increase hair dryness which can increase hair loss during pregnancy.  " Highlighting" and permanents are allowed.  Dye may be absorbed differently and permanents may not hold as well during pregnancy.  Sunbathing:  Use a sunscreen, as skin burns easily during pregnancy.  Drink plenty of fluids; avoid over heating.  Tanning Beds:  Because their possible side effects are still unknown, tanning beds are not recommended.  Ultrasound Scans:  Routine ultrasounds are performed at approximately 20 weeks.  You will be able to see your baby's general anatomy an if you would like to know the gender this can usually be determined as well.  If it is questionable when you conceived you may  also  receive an ultrasound early in your pregnancy for dating purposes.  Otherwise ultrasound exams are not routinely performed unless there is a medical necessity.  Although you can request a scan we ask that you pay for it when conducted because insurance does not cover " patient request" scans.  Work: If your pregnancy proceeds without complications you may work until your due date, unless your physician or employer advises otherwise.  Round Ligament Pain/Pelvic Discomfort:  Sharp, shooting pains not associated with bleeding are fairly common, usually occurring in the second trimester of pregnancy.  They tend to be worse when standing up or when you remain standing for long periods of time.  These are the result of pressure of certain pelvic ligaments called "round ligaments".  Rest, Tylenol and heat seem to be the most effective relief.  As the womb and fetus grow, they rise out of the pelvis and the discomfort improves.  Please notify the office if your pain seems different than that described.  It may represent a more serious condition.

## 2023-11-08 ENCOUNTER — Ambulatory Visit (INDEPENDENT_AMBULATORY_CARE_PROVIDER_SITE_OTHER): Payer: MEDICAID

## 2023-11-08 ENCOUNTER — Encounter: Payer: Self-pay | Admitting: Obstetrics

## 2023-11-08 DIAGNOSIS — Z3A10 10 weeks gestation of pregnancy: Secondary | ICD-10-CM | POA: Diagnosis not present

## 2023-11-08 DIAGNOSIS — Z3687 Encounter for antenatal screening for uncertain dates: Secondary | ICD-10-CM | POA: Diagnosis not present

## 2023-11-08 DIAGNOSIS — Z3200 Encounter for pregnancy test, result unknown: Secondary | ICD-10-CM

## 2023-11-13 ENCOUNTER — Encounter: Payer: MEDICAID | Admitting: Obstetrics

## 2023-11-22 ENCOUNTER — Encounter: Payer: MEDICAID | Admitting: Licensed Practical Nurse

## 2023-12-04 ENCOUNTER — Other Ambulatory Visit (HOSPITAL_COMMUNITY)
Admission: RE | Admit: 2023-12-04 | Discharge: 2023-12-04 | Disposition: A | Discharge status: Home / Self Care | Payer: MEDICAID | Source: Ambulatory Visit

## 2023-12-04 ENCOUNTER — Ambulatory Visit (INDEPENDENT_AMBULATORY_CARE_PROVIDER_SITE_OTHER): Payer: MEDICAID

## 2023-12-04 VITALS — BP 109/65 | HR 76 | Wt 199.0 lb

## 2023-12-04 DIAGNOSIS — O3401 Maternal care for unspecified congenital malformation of uterus, first trimester: Secondary | ICD-10-CM | POA: Diagnosis not present

## 2023-12-04 DIAGNOSIS — D649 Anemia, unspecified: Secondary | ICD-10-CM

## 2023-12-04 DIAGNOSIS — F1210 Cannabis abuse, uncomplicated: Secondary | ICD-10-CM

## 2023-12-04 DIAGNOSIS — Z13220 Encounter for screening for lipoid disorders: Secondary | ICD-10-CM | POA: Insufficient documentation

## 2023-12-04 DIAGNOSIS — T50904D Poisoning by unspecified drugs, medicaments and biological substances, undetermined, subsequent encounter: Secondary | ICD-10-CM

## 2023-12-04 DIAGNOSIS — Z113 Encounter for screening for infections with a predominantly sexual mode of transmission: Secondary | ICD-10-CM

## 2023-12-04 DIAGNOSIS — F251 Schizoaffective disorder, depressive type: Secondary | ICD-10-CM

## 2023-12-04 DIAGNOSIS — Z3A13 13 weeks gestation of pregnancy: Secondary | ICD-10-CM

## 2023-12-04 DIAGNOSIS — Z3689 Encounter for other specified antenatal screening: Secondary | ICD-10-CM

## 2023-12-04 DIAGNOSIS — Q513 Bicornate uterus: Secondary | ICD-10-CM | POA: Diagnosis not present

## 2023-12-04 DIAGNOSIS — O0992 Supervision of high risk pregnancy, unspecified, second trimester: Secondary | ICD-10-CM

## 2023-12-04 DIAGNOSIS — Z131 Encounter for screening for diabetes mellitus: Secondary | ICD-10-CM

## 2023-12-04 DIAGNOSIS — Z0184 Encounter for antibody response examination: Secondary | ICD-10-CM | POA: Diagnosis present

## 2023-12-04 DIAGNOSIS — O99210 Obesity complicating pregnancy, unspecified trimester: Secondary | ICD-10-CM | POA: Insufficient documentation

## 2023-12-04 DIAGNOSIS — Z1379 Encounter for other screening for genetic and chromosomal anomalies: Secondary | ICD-10-CM

## 2023-12-04 DIAGNOSIS — O99212 Obesity complicating pregnancy, second trimester: Secondary | ICD-10-CM

## 2023-12-04 DIAGNOSIS — O99321 Drug use complicating pregnancy, first trimester: Secondary | ICD-10-CM

## 2023-12-04 DIAGNOSIS — Z3480 Encounter for supervision of other normal pregnancy, unspecified trimester: Secondary | ICD-10-CM

## 2023-12-04 DIAGNOSIS — O99341 Other mental disorders complicating pregnancy, first trimester: Secondary | ICD-10-CM

## 2023-12-04 DIAGNOSIS — O99211 Obesity complicating pregnancy, first trimester: Secondary | ICD-10-CM

## 2023-12-04 DIAGNOSIS — E669 Obesity, unspecified: Secondary | ICD-10-CM

## 2023-12-04 MED ORDER — PRENATAL 27-0.8 MG PO TABS: 1.0000 | ORAL_TABLET | Freq: Every day | ORAL | 11 refills | Status: DC

## 2023-12-04 MED ORDER — ASPIRIN 81 MG PO TBEC: 81.0000 mg | DELAYED_RELEASE_TABLET | Freq: Every day | ORAL | 2 refills | Status: DC

## 2023-12-04 NOTE — Assessment & Plan Note (Addendum)
-   Declines UDS at [facility].  - Has been using marijuana occasionally. Aware of drug-testing policy at hospital. Wants to stop in pregnancy.  - Encouraged cessation.

## 2023-12-04 NOTE — Assessment & Plan Note (Signed)
-   Not currently on medication. Had been considering going back on medication but then found out she was pregnant.  - Feels well-supported by boyfriend and mother.

## 2023-12-04 NOTE — Addendum Note (Signed)
 Addended by: Loney Laurence on: 12/04/2023 10:55 AM   Modules accepted: Orders

## 2023-12-04 NOTE — Assessment & Plan Note (Signed)
-   Hospitalized two years ago, can't remember details. Feels stable currently.

## 2023-12-04 NOTE — Assessment & Plan Note (Signed)
 Routine prenatal care. We discussed an overview of prenatal care and when to call. Reviewed diet, exercise, and weight gain recommendations in pregnancy. Discussed benefits of breastfeeding and lactation resources at [facility]. I reviewed labs and answered all questions.
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
## 2023-12-04 NOTE — Assessment & Plan Note (Signed)
-   MFM anatomy ultrasound ordered.

## 2023-12-04 NOTE — Assessment & Plan Note (Signed)
-   Baseline PIH labs collected today.  - Early 1 hour glucola at next visit.  - Discussed recommended weight gain of 11-20 lbs.  - Started on daily LDASA.

## 2023-12-05 LAB — URINALYSIS, ROUTINE W REFLEX MICROSCOPIC
Bilirubin, UA: NEGATIVE
Glucose, UA: NEGATIVE
Ketones, UA: NEGATIVE
Leukocytes,UA: NEGATIVE
Nitrite, UA: NEGATIVE
Protein,UA: NEGATIVE
RBC, UA: NEGATIVE
Specific Gravity, UA: 1.015 (ref 1.005–1.030)
Urobilinogen, Ur: 0.2 mg/dL (ref 0.2–1.0)
pH, UA: 8.5 — ABNORMAL HIGH (ref 5.0–7.5)

## 2023-12-05 LAB — CBC/D/PLT+RPR+RH+ABO+RUBIGG...
Antibody Screen: NEGATIVE
Basophils Absolute: 0.1 10*3/uL (ref 0.0–0.2)
Basos: 1 %
EOS (ABSOLUTE): 0.2 10*3/uL (ref 0.0–0.4)
Eos: 2 %
HCV Ab: NONREACTIVE
HIV Screen 4th Generation wRfx: NONREACTIVE
Hematocrit: 40.8 % (ref 34.0–46.6)
Hemoglobin: 13.8 g/dL (ref 11.1–15.9)
Hepatitis B Surface Ag: NEGATIVE
Immature Grans (Abs): 0.1 10*3/uL (ref 0.0–0.1)
Immature Granulocytes: 1 %
Lymphocytes Absolute: 1.9 10*3/uL (ref 0.7–3.1)
Lymphs: 18 %
MCH: 31.2 pg (ref 26.6–33.0)
MCHC: 33.8 g/dL (ref 31.5–35.7)
MCV: 92 fL (ref 79–97)
Monocytes Absolute: 0.5 10*3/uL (ref 0.1–0.9)
Monocytes: 5 %
Neutrophils Absolute: 8 10*3/uL — ABNORMAL HIGH (ref 1.4–7.0)
Neutrophils: 73 %
Platelets: 331 10*3/uL (ref 150–450)
RBC: 4.43 x10E6/uL (ref 3.77–5.28)
RDW: 12.8 % (ref 11.7–15.4)
RPR Ser Ql: NONREACTIVE
Rh Factor: POSITIVE
Rubella Antibodies, IGG: <0.9 {index} — ABNORMAL LOW (ref >0.99)
Varicella zoster IgG: NONREACTIVE
WBC: 10.8 10*3/uL (ref 3.4–10.8)

## 2023-12-05 LAB — CERVICOVAGINAL ANCILLARY ONLY
Chlamydia: NEGATIVE
Comment: NEGATIVE
Comment: NORMAL
Neisseria Gonorrhea: NEGATIVE

## 2023-12-05 LAB — HEMOGLOBIN A1C
Est. average glucose Bld gHb Est-mCnc: 100 mg/dL
Hgb A1c MFr Bld: 5.1 % (ref 4.8–5.6)

## 2023-12-05 LAB — COMPREHENSIVE METABOLIC PANEL
ALT: 16 IU/L (ref 0–32)
AST: 14 IU/L (ref 0–40)
Albumin: 4 g/dL (ref 4.0–5.0)
Alkaline Phosphatase: 85 IU/L (ref 44–121)
BUN/Creatinine Ratio: 11 (ref 9–23)
BUN: 6 mg/dL (ref 6–20)
Bilirubin Total: 0.2 mg/dL (ref 0.0–1.2)
CO2: 18 mmol/L — ABNORMAL LOW (ref 20–29)
Calcium: 8.9 mg/dL (ref 8.7–10.2)
Chloride: 105 mmol/L (ref 96–106)
Creatinine, Ser: 0.57 mg/dL (ref 0.57–1.00)
Globulin, Total: 2.4 g/dL (ref 1.5–4.5)
Glucose: 75 mg/dL (ref 70–99)
Potassium: 4.3 mmol/L (ref 3.5–5.2)
Sodium: 137 mmol/L (ref 134–144)
Total Protein: 6.4 g/dL (ref 6.0–8.5)
eGFR: 132 mL/min/{1.73_m2} (ref >59)

## 2023-12-05 LAB — HCV INTERPRETATION

## 2023-12-05 LAB — TSH: TSH: 1.87 u[IU]/mL (ref 0.450–4.500)

## 2023-12-06 LAB — URINE CULTURE, OB REFLEX

## 2023-12-06 LAB — CREATININE, URINE, RANDOM: Creatinine, Urine: 71.3 mg/dL

## 2023-12-06 LAB — CULTURE, OB URINE

## 2023-12-08 DIAGNOSIS — Z2839 Other underimmunization status: Secondary | ICD-10-CM | POA: Insufficient documentation

## 2023-12-08 DIAGNOSIS — O09899 Supervision of other high risk pregnancies, unspecified trimester: Secondary | ICD-10-CM | POA: Insufficient documentation

## 2023-12-08 LAB — MATERNIT 21 PLUS CORE, BLOOD
Fetal Fraction: 12
Result (T21): NEGATIVE
Trisomy 13 (Patau syndrome): NEGATIVE
Trisomy 18 (Edwards syndrome): NEGATIVE
Trisomy 21 (Down syndrome): NEGATIVE

## 2023-12-08 NOTE — Telephone Encounter (Signed)
 Patient requested copy of gender results. She will stop by in a few minutes to pick up. Advised results printed and placed in envelope at front desk for pick up.

## 2023-12-26 ENCOUNTER — Encounter: Payer: MEDICAID | Admitting: Obstetrics

## 2023-12-27 ENCOUNTER — Telehealth: Payer: MEDICAID | Admitting: Physician Assistant

## 2023-12-27 DIAGNOSIS — J301 Allergic rhinitis due to pollen: Secondary | ICD-10-CM

## 2023-12-27 MED ORDER — FLUTICASONE PROPIONATE 50 MCG/ACT NA SUSP: 2.0000 | Freq: Every day | NASAL | 0 refills | Status: DC

## 2023-12-27 MED ORDER — LORATADINE 10 MG PO TABS: 10.0000 mg | ORAL_TABLET | Freq: Every day | ORAL | 0 refills | Status: DC

## 2023-12-27 NOTE — Progress Notes (Signed)
 Virtual Visit Consent   Madison Boyer, you are scheduled for a virtual visit with a Home provider today. Just as with appointments in the office, your consent must be obtained to participate. Your consent will be active for this visit and any virtual visit you may have with one of our providers in the next 365 days. If you have a MyChart account, a copy of this consent can be sent to you electronically.  As this is a virtual visit, video technology does not allow for your provider to perform a traditional examination. This may limit your provider's ability to fully assess your condition. If your provider identifies any concerns that need to be evaluated in person or the need to arrange testing (such as labs, EKG, etc.), we will make arrangements to do so. Although advances in technology are sophisticated, we cannot ensure that it will always work on either your end or our end. If the connection with a video visit is poor, the visit may have to be switched to a telephone visit. With either a video or telephone visit, we are not always able to ensure that we have a secure connection.  By engaging in this virtual visit, you consent to the provision of healthcare and authorize for your insurance to be billed (if applicable) for the services provided during this visit. Depending on your insurance coverage, you may receive a charge related to this service.  I need to obtain your verbal consent now. Are you willing to proceed with your visit today? [name] has provided verbal consent on 12/27/2023 for a virtual visit (video or telephone). Margaretann Loveless, PA-C  Date: 12/27/2023 1:13 PM   Virtual Visit via Video Note   I, Margaretann Loveless, connected with  Brihanna Devenport  ([id number], Jul 22, 2001) on 12/27/23 at 12:15 PM EDT by a video-enabled telemedicine application and verified that I am speaking with the correct person using two identifiers.  Location: Patient:  Virtual Visit Location Patient: Home Provider: Virtual Visit Location Provider: Home Office   I discussed the limitations of evaluation and management by telemedicine and the availability of in person appointments. The patient expressed understanding and agreed to proceed.    History of Present Illness: Madison Boyer is a 23 y.o. who identifies as a female who was assigned female at birth, and is being seen today for mucus drainage.  HPI: URI  This is a new problem. The current episode started in the past 7 days (4 days). The problem has been unchanged. There has been no fever. Associated symptoms include congestion, coughing (at night), rhinorrhea, sinus pain and a sore throat (first day but now improved). Pertinent negatives include no ear pain, headaches, nausea, plugged ear sensation or wheezing. Treatments tried: hot teas. The treatment provided no relief.  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   Obesity affecting pregnancy 12/04/2023   Supervision of other normal pregnancy, antepartum 10/30/2023   Acute cystitis with hematuria 09/08/2023   Overdose of antidepressant 03/22/2022   Acute psychosis (HCC) 03/25/2020   Cannabis abuse 03/25/2020   Schizoaffective disorder (HCC) 02/15/2020   Dysmenorrhea     Allergies: No Known Allergies Medications:  Current Outpatient Medications:    fluticasone (FLONASE) 50 MCG/ACT nasal spray, Place 2 sprays into both nostrils daily., Disp: 16 g, Rfl:  0   loratadine (CLARITIN) 10 MG tablet, Take 1 tablet (10 mg total) by mouth daily., Disp: 30 tablet, Rfl: 0   aspirin EC 81 MG tablet, Take 1 tablet (81 mg total) by mouth daily. Take after 12 weeks for prevention of preeclampssia later in pregnancy, Disp: 300 tablet, Rfl: 2   Prenatal  MV-Min-FA-Omega-3 (VITAFUSION PRENATAL PO), Take by mouth., Disp: , Rfl:    Prenatal Vit-Fe Fumarate-FA (MULTIVITAMIN-PRENATAL) 27-0.8 MG TABS tablet, Take 1 tablet by mouth daily at 12 noon., Disp: 30 tablet, Rfl: 11   triamcinolone cream (KENALOG) 0.1 %, Apply 1 Application topically 2 (two) times daily as needed (eczema/rash)., Disp: 80 g, Rfl: 3   valACYclovir (VALTREX) 500 MG tablet, Take 1 tablet (500 mg total) by mouth 2 (two) times daily. During flare ups. (Patient not taking: Reported on 10/30/2023), Disp: 60 tablet, Rfl: 2  Observations/Objective: Patient is well-developed, well-nourished in no acute distress.  Resting comfortably at home.  Head is normocephalic, atraumatic.  No labored breathing.  Speech is clear and coherent with logical content.  Patient is alert and oriented at baseline.    Assessment and Plan: 1. Seasonal allergic rhinitis due to pollen (Primary) - fluticasone (FLONASE) 50 MCG/ACT nasal spray; Place 2 sprays into both nostrils daily.  Dispense: 16 g; Refill: 0 - loratadine (CLARITIN) 10 MG tablet; Take 1 tablet (10 mg total) by mouth daily.  Dispense: 30 tablet; Refill: 0  - Suspect seasonal allergies - Add Flonase and Loratadine - May add Mucinex DM if needed - Saline nasal rinses - Steam and Humidifier - Seek further evaluation if symptoms worsen or fail to improve  Follow Up Instructions: I discussed the assessment and treatment plan with the patient. The patient was provided an opportunity to ask questions and all were answered. The patient agreed with the plan and demonstrated an understanding of the instructions.  A copy of instructions were sent to the patient via MyChart unless otherwise noted below.    The patient was advised to call back or seek an in-person evaluation if the symptoms worsen or if the condition fails to improve as anticipated.    Margaretann Loveless, PA-C

## 2023-12-27 NOTE — Patient Instructions (Signed)
 Madison Boyer, thank you for joining Margaretann Loveless, PA-C for today's virtual visit.  While this provider is not your primary care provider (PCP), if your PCP is located in our provider database this encounter information will be shared with them immediately following your visit.   A [facility] MyChart account gives you access to today's visit and all your visits, tests, and labs performed at [facility] " click here if you don't have a [facility] MyChart account or go to [email or web address]  Consent: (Patient) Madison Boyer provided verbal consent for this virtual visit at the beginning of the encounter.  Current Medications:  Current Outpatient Medications:    fluticasone (FLONASE) 50 MCG/ACT nasal spray, Place 2 sprays into both nostrils daily., Disp: 16 g, Rfl: 0   loratadine (CLARITIN) 10 MG tablet, Take 1 tablet (10 mg total) by mouth daily., Disp: 30 tablet, Rfl: 0   aspirin EC 81 MG tablet, Take 1 tablet (81 mg total) by mouth daily. Take after 12 weeks for prevention of preeclampssia later in pregnancy, Disp: 300 tablet, Rfl: 2   Prenatal MV-Min-FA-Omega-3 (VITAFUSION PRENATAL PO), Take by mouth., Disp: , Rfl:    Prenatal Vit-Fe Fumarate-FA (MULTIVITAMIN-PRENATAL) 27-0.8 MG TABS tablet, Take 1 tablet by mouth daily at 12 noon., Disp: 30 tablet, Rfl: 11   triamcinolone cream (KENALOG) 0.1 %, Apply 1 Application topically 2 (two) times daily as needed (eczema/rash)., Disp: 80 g, Rfl: 3   valACYclovir (VALTREX) 500 MG tablet, Take 1 tablet (500 mg total) by mouth 2 (two) times daily. During flare ups. (Patient not taking: Reported on 10/30/2023), Disp: 60 tablet, Rfl: 2   Medications ordered in this encounter:  Meds ordered this encounter  Medications   fluticasone (FLONASE) 50 MCG/ACT nasal spray    Sig: Place 2 sprays into both nostrils daily.    Dispense:  16 g    Refill:  0    Supervising Provider:   Merrilee Jansky [[id number]]    loratadine (CLARITIN) 10 MG tablet    Sig: Take 1 tablet (10 mg total) by mouth daily.    Dispense:  30 tablet    Refill:  0    Supervising Provider:   Merrilee Jansky [[id number]]     *If you need refills on other medications prior to your next appointment, please contact your pharmacy*  Follow-Up: Call back or seek an in-person evaluation if the symptoms worsen or if the condition fails to improve as anticipated.  [facility] [phone]  Other Instructions Allergies, Adult An allergy is a condition that causes the body's defense system (immune system) to react too strongly to an allergen. An allergen is a substance that is harmless to most people but can cause a reaction in some people. Allergies often affect the nose (allergic rhinitis), eyes (conjunctivitis), skin (atopic dermatitis), and stomach. They can be mild, moderate, or severe. They cannot spread from person to person. Allergies can start at any age. In some cases, they may go away as you get older. What are the causes? Allergies are caused by allergens. These may be: Outdoor allergens. These include pollen, car fumes, and mold. Indoor allergens. These include dust, smoke, mold, and pet dander. Other allergens. These include foods, medicines, scents, and insect bites or stings. What increases the risk? You are more likely to have allergies if you have: Family members with allergies. Family members who have a condition that may be caused by allergens, such as asthma. What are the  signs or symptoms? Symptoms depend on how severe your allergy is. Mild to moderate symptoms Runny nose, stuffy nose (nasal congestion), or sneezing. Itchy mouth, ears, or throat. Postnasal drip. This is a feeling of mucus dripping down the back of your throat. Sore throat. Itchy, red, watery, or puffy eyes. Skin rash, or itchy, red, swollen areas of skin (hives). Stomach cramps or bloating. Severe symptoms A bad allergy to  food, medicine, or insect bites may cause a severe reaction (anaphylactic reaction). Symptoms include: A red face. Coughing or making high-pitched whistling sounds when you breathe, most often when you breathe out (wheezing). Swollen lips, tongue, or mouth. A tight or swollen throat. Chest pain or tightness, or a fast heartbeat. Trouble breathing or shortness of breath. Pain in your abdomen. Vomiting or diarrhea. Feeling dizzy or fainting. How is this diagnosed? Allergies are diagnosed based on your symptoms, your family and medical history, and a physical exam. You may also have tests, such as: Skin tests. These may be done to see how your skin reacts to allergens. Tests include: Skin prick test. For this test, the allergen is put in your body through a small prick in the skin. Intradermal skin test. For this test, a small amount of the allergen is put under the first layer of your skin. Patch test. For this test, a small amount of the allergen is placed on your skin. The area is covered and then checked after a few days. Blood tests. A challenge test. For this test, you eat or breathe in the allergen to see if you have a reaction. You may also be asked to: Keep a food diary. This means writing down all the foods, drinks, and symptoms you have in a day. Try an elimination diet. To do this: Stop eating certain foods. Add those foods back one by one to find out if any of them cause a reaction. How is this treated?     Treatment for allergies depends on your symptoms. It may include: Cold, wet cloths (cold compresses). These can be used to soothe itching and swelling. Eye drops or nasal sprays. A saline solution to clear out your nose and keep it moist (nasal irrigation). A saline solution is made of salt and water. A humidifier. This can add moisture to the air. Skin creams. These can treat rashes or itching. Diet changes to cut out foods that cause allergies. Being exposed again and  again to tiny amounts of allergens. This can help your body build a defense against them (tolerance). This process is called immunotherapy. It may be done using: Allergy shots. This is when you get a shot of the allergen. Sublingual immunotherapy. This is when you take a small dose of the allergen under your tongue. Allergy medicines (antihistamines) or other medicines. These can help block the allergic reaction. Using an auto-injector pen. An auto-injector pen is a device filled with medicine that gives an emergency shot of epinephrine. Your health care provider will teach you how to use it. Follow these instructions at home: Medicines  Take or apply over-the-counter and prescription medicines only as told by your provider. Always carry your auto-injector pen if you are at risk of an anaphylactic reaction. Give yourself the shot as told by your provider. Eating and drinking Follow instructions from your provider about what you may eat and drink. Drink enough fluid to keep your pee (urine) pale yellow. General instructions Wear a medical alert bracelet or necklace if you have had an anaphylactic reaction  in the past. Avoid known allergens when you can. Keep all follow-up visits. Your provider will watch your symptoms and talk about treatment options with you. Contact a health care provider if: Your symptoms do not get better with treatment. Get help right away if: You have any symptoms of anaphylactic reaction. You use an auto-injector pen. You will need more medical care even if the medicine seems to be working. An anaphylactic reaction may happen again within 72 hours (rebound anaphylaxis). These symptoms may be an emergency. Use the auto-injector pen right away. Then call 911. Do not wait to see if the symptoms will go away. Do not drive yourself to the hospital. This information is not intended to replace advice given to you by your health care provider. Make sure you discuss any  questions you have with your health care provider. Document Revised: 06/01/2022 Document Reviewed: 06/01/2022 Elsevier Patient Education  2024 Elsevier Inc.   If you have been instructed to have an in-person evaluation today at a local Urgent Care facility, please use the link below. It will take you to a list of all of our available [facility], including address, phone number and hours of operation. Please do not delay care.  [facility]  If you or a family member do not have a primary care provider, use the link below to schedule a visit and establish care. When you choose a [facility] primary care physician or advanced practice provider, you gain a long-term partner in health. Find a Primary Care Provider  Learn more about [facility]'s in-office and virtual care options: [place] - Get Care Now

## 2023-12-29 ENCOUNTER — Other Ambulatory Visit: Payer: Self-pay

## 2023-12-29 DIAGNOSIS — O99212 Obesity complicating pregnancy, second trimester: Secondary | ICD-10-CM

## 2024-01-01 ENCOUNTER — Ambulatory Visit (INDEPENDENT_AMBULATORY_CARE_PROVIDER_SITE_OTHER): Payer: MEDICAID | Admitting: Obstetrics

## 2024-01-01 ENCOUNTER — Other Ambulatory Visit: Payer: MEDICAID

## 2024-01-01 VITALS — BP 107/69 | HR 78 | Wt 202.0 lb

## 2024-01-01 DIAGNOSIS — Z3480 Encounter for supervision of other normal pregnancy, unspecified trimester: Secondary | ICD-10-CM | POA: Diagnosis not present

## 2024-01-01 DIAGNOSIS — Q513 Bicornate uterus: Secondary | ICD-10-CM | POA: Diagnosis not present

## 2024-01-01 DIAGNOSIS — O99212 Obesity complicating pregnancy, second trimester: Secondary | ICD-10-CM

## 2024-01-01 NOTE — Progress Notes (Signed)
    Return Prenatal Note   Assessment/Plan   Plan  23 y.o. G1P0000 at [redacted]w[redacted]d presents for follow-up OB visit. Reviewed prenatal record including previous visit note.  Supervision of other normal pregnancy, antepartum - Danger signs reviewed and when to present.  - Anticipatory guidance reviewed    Bicornuate uterus - MFM anatomy scan scheduled for 01/03/24   No orders of the defined types were placed in this encounter.  Return in about 4 weeks (around 01/29/2024).   Future Appointments  Date Time Provider Department Center  01/10/2024  1:00 PM [facility]  06/06/2024  1:40 PM Sallyanne Kuster, NP [facility] None    For next visit:  Routine prenatal care    Subjective  Feeling well overall, has started making better food choices and is feeling good about it. She has cut back on fast food significantly. Denies LOF, VB.   Movement: Absent Contractions: Not present  Objective   Flow sheet Vitals: Pulse Rate: 78 BP: 107/69 Fetal Heart Rate (bpm): 155 Total weight gain: 0.907 kg  General Appearance  No acute distress, well appearing, and well nourished Pulmonary   Normal work of breathing Neurologic   Alert and oriented to person, place, and time Psychiatric   Mood and affect within normal limits  Ulice Dash, [facility]  01/01/24 10:43 AM

## 2024-01-01 NOTE — Assessment & Plan Note (Addendum)
-   Danger signs reviewed and when to present.  - Anticipatory guidance reviewed

## 2024-01-01 NOTE — Assessment & Plan Note (Signed)
-   MFM anatomy scan scheduled for 01/03/24

## 2024-01-02 LAB — GLUCOSE, 1 HOUR GESTATIONAL: Gestational Diabetes Screen: 91 mg/dL (ref 70–139)

## 2024-01-08 ENCOUNTER — Ambulatory Visit: Payer: MEDICAID

## 2024-01-08 DIAGNOSIS — Z3A18 18 weeks gestation of pregnancy: Secondary | ICD-10-CM

## 2024-01-08 DIAGNOSIS — Q513 Bicornate uterus: Secondary | ICD-10-CM

## 2024-01-08 DIAGNOSIS — O99891 Other specified diseases and conditions complicating pregnancy: Secondary | ICD-10-CM

## 2024-01-08 NOTE — Progress Notes (Signed)
 New OB Intake  I connected with Madison Boyer  on 01/08/24 at 8:26 AM by phone and verified that I am speaking with the correct person using two identifiers. Nurse is located at [facility] and pt is located at home.   I explained I am completing New Patient Intake today. We discussed EDD of 06/04/2024, by Ultrasound. Pt is G1P0000. I reviewed her allergies, medications and Medical/Surgical/OB history.    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   All questions were answered.

## 2024-01-10 ENCOUNTER — Ambulatory Visit: Payer: MEDICAID | Attending: Maternal & Fetal Medicine

## 2024-01-10 ENCOUNTER — Other Ambulatory Visit: Payer: Self-pay

## 2024-01-10 ENCOUNTER — Ambulatory Visit: Admitting: Maternal & Fetal Medicine

## 2024-01-10 VITALS — BP 120/64 | HR 87

## 2024-01-10 DIAGNOSIS — O34592 Maternal care for other abnormalities of gravid uterus, second trimester: Secondary | ICD-10-CM | POA: Insufficient documentation

## 2024-01-10 DIAGNOSIS — Z3A19 19 weeks gestation of pregnancy: Secondary | ICD-10-CM | POA: Diagnosis not present

## 2024-01-10 DIAGNOSIS — Q513 Bicornate uterus: Secondary | ICD-10-CM | POA: Diagnosis not present

## 2024-01-10 DIAGNOSIS — Z363 Encounter for antenatal screening for malformations: Secondary | ICD-10-CM | POA: Diagnosis not present

## 2024-01-10 DIAGNOSIS — Z362 Encounter for other antenatal screening follow-up: Secondary | ICD-10-CM

## 2024-01-10 DIAGNOSIS — O26872 Cervical shortening, second trimester: Secondary | ICD-10-CM

## 2024-01-10 DIAGNOSIS — O99212 Obesity complicating pregnancy, second trimester: Secondary | ICD-10-CM | POA: Insufficient documentation

## 2024-01-10 DIAGNOSIS — O09899 Supervision of other high risk pregnancies, unspecified trimester: Secondary | ICD-10-CM

## 2024-01-10 DIAGNOSIS — E669 Obesity, unspecified: Secondary | ICD-10-CM | POA: Diagnosis not present

## 2024-01-10 DIAGNOSIS — Z2839 Other underimmunization status: Secondary | ICD-10-CM

## 2024-01-10 DIAGNOSIS — O26879 Cervical shortening, unspecified trimester: Secondary | ICD-10-CM | POA: Insufficient documentation

## 2024-01-10 DIAGNOSIS — Z3480 Encounter for supervision of other normal pregnancy, unspecified trimester: Secondary | ICD-10-CM

## 2024-01-10 MED ORDER — PROGESTERONE 100 MG VA INST: 100.0000 mg | VAGINAL_INSERT | Freq: Two times a day (BID) | VAGINAL | 12 refills | Status: DC

## 2024-01-10 NOTE — Progress Notes (Signed)
 MFM Consultation  Ms. Liliane Shi is a 23 yo G1P0 who is 19w 1d with an EDD of 06/04/24.  She is seen today at the request of Autumn Messing CNM for a detailed exam given her elevated BMI and history of a bicornuate uterus.   She has a low risk NIPT  She denies s/sx of preeclampsia or PTL.  Imaging: A single intrauterine pregnancy was observed with measurements consistent with dates.  No markers of aneuploidy were observed today. Good fetal movement and amniotic fluid was observed.  Her cervix was dynamic with cervical canal open with visible fluid and fetal membrane at the internal os.   The CL measured 2-2.5 cm.  A sterile speculum exam was performed the cervix appeared close.  I discussed with Ms. Ponce today's findings and recommend nightly vaginal progesterone of 200 mg. This prescription was sent electronically.  Secondly I had her return in 1 weeks for a CL ( there was not an opening at [facility] or [place] at this time) we will find an appt slot, however, this can also be performed at the [facility] ultrasound unit as well.  I provided preterm delivery precautions.  All questions answered  I spent 30 minutes with > 50% in face to face consultation and care coordination. ( Nurse line at [employer] was called and I relayed this information to the nurse on the nurse line).   Novella Olive, MD

## 2024-01-11 ENCOUNTER — Institutional Professional Consult (permissible substitution)

## 2024-01-29 ENCOUNTER — Other Ambulatory Visit: Payer: Self-pay

## 2024-01-29 ENCOUNTER — Ambulatory Visit (INDEPENDENT_AMBULATORY_CARE_PROVIDER_SITE_OTHER): Payer: MEDICAID

## 2024-01-29 VITALS — BP 108/64 | HR 85 | Wt 208.0 lb

## 2024-01-29 DIAGNOSIS — O26879 Cervical shortening, unspecified trimester: Secondary | ICD-10-CM

## 2024-01-29 DIAGNOSIS — O26872 Cervical shortening, second trimester: Secondary | ICD-10-CM | POA: Diagnosis not present

## 2024-01-29 DIAGNOSIS — Z3A21 21 weeks gestation of pregnancy: Secondary | ICD-10-CM | POA: Diagnosis not present

## 2024-01-29 DIAGNOSIS — Z3400 Encounter for supervision of normal first pregnancy, unspecified trimester: Secondary | ICD-10-CM

## 2024-01-29 DIAGNOSIS — Z3480 Encounter for supervision of other normal pregnancy, unspecified trimester: Secondary | ICD-10-CM

## 2024-01-29 MED ORDER — PROGESTERONE 200 MG PO CAPS: ORAL_CAPSULE | ORAL | 6 refills | Status: DC

## 2024-01-29 MED ORDER — PROGESTERONE 100 MG VA INST: 100.0000 mg | VAGINAL_INSERT | Freq: Two times a day (BID) | VAGINAL | 12 refills | Status: DC

## 2024-01-29 NOTE — Assessment & Plan Note (Addendum)
-   Has not had follow up for short cervix since seeing MFM on 4/9. MFM recommended nightly progesterone  and weekly CL. She has not been taking progesterone  because she didn't get a notification from the pharmacy that it was ready. Prescription renewed at pharmacy on file. Provided with directions on use.  - Order placed for CL follow up US .

## 2024-01-29 NOTE — Assessment & Plan Note (Signed)
-   Provided with link for [facility] resources.  - She is hoping to not have an epidural.  - Reviewed red flag warning signs anticipatory guidance for upcoming prenatal care.

## 2024-01-29 NOTE — Progress Notes (Signed)
    Return Prenatal Note   Assessment/Plan   Plan  23 y.o. G1P0000 at [redacted]w[redacted]d presents for follow-up OB visit. Reviewed prenatal record including previous visit note.  Short cervix affecting pregnancy - Has not had follow up for short cervix since seeing MFM on 4/9. MFM recommended nightly progesterone  and weekly CL. She has not been taking progesterone  because she didn't get a notification from the pharmacy that it was ready. Prescription renewed at pharmacy on file. Provided with directions on use.  - Order placed for CL follow up US .    Supervision of other normal pregnancy, antepartum - Provided with link for [facility] resources.  - She is hoping to not have an epidural.  - Reviewed red flag warning signs anticipatory guidance for upcoming prenatal care.    Orders Placed This Encounter  Procedures   US  OB Follow Up    Standing Status:   Future    Expiration Date:   04/29/2024    Reason for exam::   Cervical Length    Preferred imaging location?:   Internal   Return in about 4 weeks (around 02/26/2024) for ROB.   Future Appointments  Date Time Provider Department Center  02/21/2024  2:00 PM [facility]  02/28/2024  9:35 AM Osaze Hubbert, Verita Glassman, CNM [facility] None  06/06/2024  1:40 PM Laurence Pons, NP [facility] None    For next visit:  schedule follow up US , continue with routine prenatal care     Subjective   22 y.o. G1P0000 at [redacted]w[redacted]d presents for this follow-up prenatal visit.  Patient doing well overall, no questions today.  Patient reports: Movement: Present Contractions: Irritability  Objective   Flow sheet Vitals: Pulse Rate: 85 BP: 108/64 Fundal Height: 21 cm Fetal Heart Rate (bpm): 155 Total weight gain: 8 lb (3.629 kg)  General Appearance  No acute distress, well appearing, and well nourished Pulmonary   Normal work of breathing Neurologic   Alert and oriented to person, place, and time Psychiatric   Mood and affect within normal  limits  Verita Glassman Ada Holness, CNM  01/28/2509:01 AM

## 2024-01-30 ENCOUNTER — Ambulatory Visit
Admission: RE | Admit: 2024-01-30 | Discharge: 2024-01-30 | Disposition: A | Discharge status: Home / Self Care | Source: Ambulatory Visit

## 2024-01-30 ENCOUNTER — Other Ambulatory Visit: Payer: Self-pay

## 2024-01-30 DIAGNOSIS — O26879 Cervical shortening, unspecified trimester: Secondary | ICD-10-CM | POA: Diagnosis present

## 2024-01-30 DIAGNOSIS — Z3480 Encounter for supervision of other normal pregnancy, unspecified trimester: Secondary | ICD-10-CM

## 2024-01-30 DIAGNOSIS — Z3400 Encounter for supervision of normal first pregnancy, unspecified trimester: Secondary | ICD-10-CM

## 2024-02-02 ENCOUNTER — Ambulatory Visit

## 2024-02-05 ENCOUNTER — Other Ambulatory Visit (HOSPITAL_COMMUNITY)
Admission: RE | Admit: 2024-02-05 | Discharge: 2024-02-05 | Disposition: A | Discharge status: Home / Self Care | Source: Ambulatory Visit | Attending: Obstetrics and Gynecology | Admitting: Obstetrics and Gynecology

## 2024-02-05 ENCOUNTER — Ambulatory Visit (INDEPENDENT_AMBULATORY_CARE_PROVIDER_SITE_OTHER)

## 2024-02-05 VITALS — BP 113/75 | HR 98 | Ht 62.0 in | Wt 208.3 lb

## 2024-02-05 DIAGNOSIS — Z113 Encounter for screening for infections with a predominantly sexual mode of transmission: Secondary | ICD-10-CM

## 2024-02-05 NOTE — Progress Notes (Signed)
    NURSE VISIT NOTE  Subjective:    Patient ID: Madison Boyer, female    DOB: [birth year], 23 y.o.   MRN: [id number]  HPI  Patient is a 23 y.o. G16P0000 female who presents for STD screening today. Patient is unsure  of known exposure to STD and wants to be preventative. She denies any symptoms at this time.   Objective:    Ht 5\' 2"  (1.575 m)   Wt 208 lb 4.8 oz (94.5 kg)   LMP 08/03/2023   BMI 38.10 kg/m      Assessment:   1. Screening for STD (sexually transmitted disease)      Plan:   GC and chlamydia DNA  probe sent to lab. Treatment: abstain from coitus during course of treatment and await results for further treatment. ROV prn if symptoms persist or worsen.   Vale Garrison, CMA

## 2024-02-07 ENCOUNTER — Other Ambulatory Visit: Payer: Self-pay

## 2024-02-07 DIAGNOSIS — B9689 Other specified bacterial agents as the cause of diseases classified elsewhere: Secondary | ICD-10-CM

## 2024-02-07 LAB — CERVICOVAGINAL ANCILLARY ONLY
Bacterial Vaginitis (gardnerella): POSITIVE — AB
Candida Glabrata: NEGATIVE
Candida Vaginitis: NEGATIVE
Chlamydia: NEGATIVE
Comment: NEGATIVE
Comment: NEGATIVE
Comment: NEGATIVE
Comment: NEGATIVE
Comment: NEGATIVE
Comment: NORMAL
Neisseria Gonorrhea: NEGATIVE
Trichomonas: NEGATIVE

## 2024-02-07 MED ORDER — METRONIDAZOLE 500 MG PO TABS: 500.0000 mg | ORAL_TABLET | Freq: Two times a day (BID) | ORAL | 0 refills | Status: DC

## 2024-02-21 ENCOUNTER — Ambulatory Visit: Attending: Obstetrics

## 2024-02-21 ENCOUNTER — Other Ambulatory Visit: Payer: Self-pay

## 2024-02-21 ENCOUNTER — Ambulatory Visit (HOSPITAL_BASED_OUTPATIENT_CLINIC_OR_DEPARTMENT_OTHER): Admitting: Obstetrics

## 2024-02-21 VITALS — BP 116/64 | HR 83

## 2024-02-21 DIAGNOSIS — O99212 Obesity complicating pregnancy, second trimester: Secondary | ICD-10-CM

## 2024-02-21 DIAGNOSIS — O26872 Cervical shortening, second trimester: Secondary | ICD-10-CM

## 2024-02-21 DIAGNOSIS — Q513 Bicornate uterus: Secondary | ICD-10-CM

## 2024-02-21 DIAGNOSIS — O34592 Maternal care for other abnormalities of gravid uterus, second trimester: Secondary | ICD-10-CM | POA: Diagnosis not present

## 2024-02-21 DIAGNOSIS — Z3A25 25 weeks gestation of pregnancy: Secondary | ICD-10-CM | POA: Insufficient documentation

## 2024-02-21 DIAGNOSIS — E669 Obesity, unspecified: Secondary | ICD-10-CM

## 2024-02-21 DIAGNOSIS — Z362 Encounter for other antenatal screening follow-up: Secondary | ICD-10-CM | POA: Diagnosis present

## 2024-02-21 DIAGNOSIS — O34593 Maternal care for other abnormalities of gravid uterus, third trimester: Secondary | ICD-10-CM | POA: Diagnosis not present

## 2024-02-21 DIAGNOSIS — Z3480 Encounter for supervision of other normal pregnancy, unspecified trimester: Secondary | ICD-10-CM

## 2024-02-21 DIAGNOSIS — O09899 Supervision of other high risk pregnancies, unspecified trimester: Secondary | ICD-10-CM

## 2024-02-21 NOTE — Progress Notes (Signed)
 MFM Consult Note  Madison Boyer is currently at 25 weeks and 1 day.  She has been followed due to maternal obesity with a BMI of 35 and a bicornuate uterus.    A shortened/dynamic cervix was noted on her prior ultrasound exam.  Due to that finding, she is currently treated with daily vaginal progesterone .    This is her first pregnancy.  She denies any prior surgeries to her cervix.   She had an ultrasound performed by radiology at [facility] three weeks ago that showed a cervical length of 4 cm with funneling of the internal os.  On today's exam, the overall EFW of 1 pound 12 ounces measures at the 42nd percentile for her gestational age.    There was normal amniotic fluid noted.    The patient was advised to continue using the daily vaginal progesterone  until 36 weeks.  She was reassured that based on her history and the last cervical length she had, her overall risk of a preterm birth is low.  Due to her bicornuate uterus, a follow-up growth scan was scheduled in 6 weeks.    The patient stated that all of her questions were answered today.  A total of 20 minutes was spent counseling and coordinating the care for this patient.  Greater than 50% of the time was spent in direct face-to-face contact.

## 2024-02-28 ENCOUNTER — Ambulatory Visit (INDEPENDENT_AMBULATORY_CARE_PROVIDER_SITE_OTHER)

## 2024-02-28 VITALS — BP 113/64

## 2024-02-28 DIAGNOSIS — Z113 Encounter for screening for infections with a predominantly sexual mode of transmission: Secondary | ICD-10-CM

## 2024-02-28 DIAGNOSIS — Z130 Encounter for screening for diseases of the blood and blood-forming organs and certain disorders involving the immune mechanism: Secondary | ICD-10-CM

## 2024-02-28 DIAGNOSIS — Z131 Encounter for screening for diabetes mellitus: Secondary | ICD-10-CM

## 2024-02-28 DIAGNOSIS — Z3A26 26 weeks gestation of pregnancy: Secondary | ICD-10-CM | POA: Diagnosis not present

## 2024-02-28 DIAGNOSIS — O26872 Cervical shortening, second trimester: Secondary | ICD-10-CM

## 2024-02-28 DIAGNOSIS — Z3400 Encounter for supervision of normal first pregnancy, unspecified trimester: Secondary | ICD-10-CM

## 2024-02-28 DIAGNOSIS — Z3480 Encounter for supervision of other normal pregnancy, unspecified trimester: Secondary | ICD-10-CM

## 2024-02-28 DIAGNOSIS — Z23 Encounter for immunization: Secondary | ICD-10-CM

## 2024-02-28 DIAGNOSIS — O26879 Cervical shortening, unspecified trimester: Secondary | ICD-10-CM

## 2024-02-28 NOTE — Assessment & Plan Note (Addendum)
-   Tdap, and BTFC completed today. Unable to complete GDM screening today due to eating breakfast of fruit loops and rice pudding. Will schedule next week.  - Reviewed kick counts and preterm labor warning signs. Instructed to call office or come to hospital with persistent headache, vision changes, regular contractions, leaking of fluid, decreased fetal movement or vaginal bleeding.

## 2024-02-28 NOTE — Progress Notes (Signed)
    Return Prenatal Note   Assessment/Plan   Plan  23 y.o. G1P0000 at [redacted]w[redacted]d presents for follow-up OB visit. Reviewed prenatal record including previous visit note.  Supervision of other normal pregnancy, antepartum - Tdap, and BTFC completed today. Unable to complete GDM screening today due to eating breakfast of fruit loops and rice pudding. Will schedule next week.  - Reviewed kick counts and preterm labor warning signs. Instructed to call office or come to hospital with persistent headache, vision changes, regular contractions, leaking of fluid, decreased fetal movement or vaginal bleeding.  Short cervix affecting pregnancy - MFM US  on 5/21, EFW 42%, cervix long, continue with nightly progesterone . 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    Orders Placed This Encounter  Procedures   Tdap vaccine greater than or equal to 7yo IM   28 Week RH+Panel    Standing Status:   Future    Expected Date:   03/13/2024    Expiration Date:   02/27/2025   Return in about 2 weeks (around 03/13/2024) for ROB.   Future Appointments  Date Time Provider Department Center  04/03/2024  3:00 PM [facility]  06/06/2024  1:40 PM Laurence Pons, NP [facility] None    For next visit:  Make lab appointment for next week.       Subjective   22 y.o. G1P0000 at [redacted]w[redacted]d presents for this follow-up prenatal visit.  Patient doing well.  Patient reports:    Objective   Flow sheet Vitals: BP: 113/64 Fundal Height: 26 cm Fetal Heart Rate (bpm): 160 Total weight gain: 8 lb (3.629 kg)  General Appearance  No acute distress, well appearing, and well nourished Pulmonary   Normal work of breathing Neurologic   Alert and oriented to person, place, and time Psychiatric   Mood and affect within normal limits  Madison Boyer Madison Boyer, CNM  02/27/2509:05 AM

## 2024-02-28 NOTE — Patient Instructions (Signed)
 Tdap (Tetanus, Diphtheria, Pertussis) Vaccine: What You Need to Know Many vaccine information statements are available in Spanish and other languages. See PromoAge.com.br. 1. Why get vaccinated? Tdap vaccine can prevent tetanus, diphtheria, and pertussis. Diphtheria and pertussis spread from person to person. Tetanus enters the body through cuts or wounds. TETANUS (T) causes painful stiffening of the muscles. Tetanus can lead to serious health problems, including being unable to open the mouth, having trouble swallowing and breathing, or death. DIPHTHERIA (D) can lead to difficulty breathing, heart failure, paralysis, or death. PERTUSSIS (aP), also known as "whooping cough," can cause uncontrollable, violent coughing that makes it hard to breathe, eat, or drink. Pertussis can be extremely serious especially in babies and young children, causing pneumonia, convulsions, brain damage, or death. In teens and adults, it can cause weight loss, loss of bladder control, passing out, and rib fractures from severe coughing. 2. Tdap vaccine Tdap is only for children 7 years and older, adolescents, and adults.  Adolescents should receive a single dose of Tdap, preferably at age 76 or 12 years. Pregnant people should get a dose of Tdap during every pregnancy, preferably during the early part of the third trimester, to help protect the newborn from pertussis. Infants are most at risk for severe, life-threatening complications from pertussis. Adults who have never received Tdap should get a dose of Tdap. Also, adults should receive a booster dose of either Tdap or Td (a different vaccine that protects against tetanus and diphtheria but not pertussis) every 10 years, or after 5 years in the case of a severe or dirty wound or burn. Tdap may be given at the same time as other vaccines. 3. Talk with your health care provider Tell your vaccine provider if the person getting the vaccine: Has had an allergic  reaction after a previous dose of any vaccine that protects against tetanus, diphtheria, or pertussis, or has any severe, life-threatening allergies Has had a coma, decreased level of consciousness, or prolonged seizures within 7 days after a previous dose of any pertussis vaccine (DTP, DTaP, or Tdap) Has seizures or another nervous system problem Has ever had Guillain-Barr Syndrome (also called "GBS") Has had severe pain or swelling after a previous dose of any vaccine that protects against tetanus or diphtheria In some cases, your health care provider may decide to postpone Tdap vaccination until a future visit. People with minor illnesses, such as a cold, may be vaccinated. People who are moderately or severely ill should usually wait until they recover before getting Tdap vaccine.  Your health care provider can give you more information. 4. Risks of a vaccine reaction Pain, redness, or swelling where the shot was given, mild fever, headache, feeling tired, and nausea, vomiting, diarrhea, or stomachache sometimes happen after Tdap vaccination. People sometimes faint after medical procedures, including vaccination. Tell your provider if you feel dizzy or have vision changes or ringing in the ears.  As with any medicine, there is a very remote chance of a vaccine causing a severe allergic reaction, other serious injury, or death. 5. What if there is a serious problem? An allergic reaction could occur after the vaccinated person leaves the clinic. If you see signs of a severe allergic reaction (hives, swelling of the face and throat, difficulty breathing, a fast heartbeat, dizziness, or weakness), call 9-1-1 and get the person to the nearest hospital. For other signs that concern you, call your health care provider.  Adverse reactions should be reported to the Vaccine Adverse Event Reporting  System (VAERS). Your health care provider will usually file this report, or you can do it yourself. Visit the  VAERS website at www.vaers.LAgents.no or call 437-731-6503. VAERS is only for reporting reactions, and VAERS staff members do not give medical advice. 6. The National Vaccine Injury Compensation Program The [employer] Vaccine Injury Compensation Program (VICP) is a federal program that was created to compensate people who may have been injured by certain vaccines. Claims regarding alleged injury or death due to vaccination have a time limit for filing, which may be as short as two years. Visit the VICP website at SpiritualWord.at or call (669) 837-1631 to learn about the program and about filing a claim. 7. How can I learn more? Ask your health care provider. Call your local or state health department. Visit the website of the Food and Drug Administration (FDA) for vaccine package inserts and additional information at FinderList.no. Contact the Centers for Disease Control and Prevention (CDC): Call (484) 483-2759 (1-800-CDC-INFO) or Visit CDC's website at PicCapture.uy. Source: CDC Vaccine Information Statement Tdap (Tetanus, Diphtheria, Pertussis) Vaccine (05/08/2020) This same material is available at FootballExhibition.com.br for no charge. This information is not intended to replace advice given to you by your health care provider. Make sure you discuss any questions you have with your health care provider. Document Revised: 01/04/2023 Document Reviewed: 11/04/2022 Elsevier Patient Education  2024 [employer].

## 2024-02-28 NOTE — Assessment & Plan Note (Signed)
-   MFM US  on 5/21, EFW 42%, cervix long, continue with nightly progesterone . 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

## 2024-03-06 ENCOUNTER — Other Ambulatory Visit

## 2024-03-06 DIAGNOSIS — Z131 Encounter for screening for diabetes mellitus: Secondary | ICD-10-CM

## 2024-03-06 DIAGNOSIS — Z3400 Encounter for supervision of normal first pregnancy, unspecified trimester: Secondary | ICD-10-CM

## 2024-03-06 DIAGNOSIS — Z113 Encounter for screening for infections with a predominantly sexual mode of transmission: Secondary | ICD-10-CM

## 2024-03-06 DIAGNOSIS — Z3A26 26 weeks gestation of pregnancy: Secondary | ICD-10-CM

## 2024-03-06 DIAGNOSIS — Z130 Encounter for screening for diseases of the blood and blood-forming organs and certain disorders involving the immune mechanism: Secondary | ICD-10-CM

## 2024-03-07 ENCOUNTER — Ambulatory Visit: Payer: Self-pay

## 2024-03-07 LAB — 28 WEEK RH+PANEL
Basophils Absolute: 0 10*3/uL (ref 0.0–0.2)
Basos: 0 %
EOS (ABSOLUTE): 0.2 10*3/uL (ref 0.0–0.4)
Eos: 2 %
Gestational Diabetes Screen: 103 mg/dL (ref 70–139)
HIV Screen 4th Generation wRfx: NONREACTIVE
Hematocrit: 40.4 % (ref 34.0–46.6)
Hemoglobin: 13.3 g/dL (ref 11.1–15.9)
Immature Grans (Abs): 0.2 10*3/uL — ABNORMAL HIGH (ref 0.0–0.1)
Immature Granulocytes: 2 %
Lymphocytes Absolute: 1.8 10*3/uL (ref 0.7–3.1)
Lymphs: 19 %
MCH: 31.1 pg (ref 26.6–33.0)
MCHC: 32.9 g/dL (ref 31.5–35.7)
MCV: 95 fL (ref 79–97)
Monocytes Absolute: 0.5 10*3/uL (ref 0.1–0.9)
Monocytes: 6 %
Neutrophils Absolute: 6.5 10*3/uL (ref 1.4–7.0)
Neutrophils: 71 %
Platelets: 290 10*3/uL (ref 150–450)
RBC: 4.27 x10E6/uL (ref 3.77–5.28)
RDW: 12.8 % (ref 11.7–15.4)
RPR Ser Ql: NONREACTIVE
WBC: 9.2 10*3/uL (ref 3.4–10.8)

## 2024-03-11 NOTE — Progress Notes (Unsigned)
    Return Prenatal Note   Subjective   23 y.o. G1P0000 at [redacted]w[redacted]d presents for this follow-up prenatal visit.  Patient increase in depression and anxiety symptoms that also include some SI ideation. This is not new to her but has become more frequent. The father of the baby has decided to no longer be a part of the pregnancy and her family will be leaving for [place] for the summer. She will stay at her Dads and he is safe but not very supportive. She endorses thoughts of using a gun to harm herself but she does currently have access to a firearm or a plan to obtain one.  Patient reports: Movement: Present Contractions: Not present  Objective   Flow sheet Vitals: Pulse Rate: 75 BP: 111/64 Total weight gain: 13 lb (5.897 kg)  General Appearance  No acute distress, well appearing, and well nourished Pulmonary   Normal work of breathing Neurologic   Alert and oriented to person, place, and time Psychiatric   Mood and affect within normal limits  Assessment/Plan   Plan  23 y.o. G1P0000 at [redacted]w[redacted]d presents for follow-up OB visit. Reviewed prenatal record including previous visit note.  Schizoaffective disorder (HCC) EDPS 16 today. Sometimes to thoughts of hurting herself Reviewed safety plan for [place] to include calling her best friend in [place]  or coming to triage if her thoughts of harm increase. She denies access to a weapon or a current plan to obtain one. She does not feel like she will hurt herself. She declines interventions today for inpatient behavioral health or to begin medications. She is open to therapy and said it has helped in the past. Her case worker for Medicaid happened to be here to meet with her and information was provider by her to the patient for support.   Supervision of other normal pregnancy, antepartum Reviewed kick counts and preterm labor warning signs. Instructed to call office or come to hospital with persistent headache, vision changes, regular  contractions, leaking of fluid, decreased fetal movement or vaginal bleeding.   Bicornuate uterus Growth scheduled for 7/12      No orders of the defined types were placed in this encounter.  No follow-ups on file.   Future Appointments  Date Time Provider Department Center  03/29/2024 10:15 AM Angelita Kendall, CNM [facility] None  04/10/2024  3:00 PM [facility]  06/06/2024  1:40 PM Laurence Pons, NP [facility] None    For next visit:  continue with routine prenatal care     Donato Fu, CNM [facility]

## 2024-03-13 ENCOUNTER — Ambulatory Visit: Admitting: Certified Nurse Midwife

## 2024-03-13 VITALS — BP 111/64 | HR 75 | Wt 213.0 lb

## 2024-03-13 DIAGNOSIS — O99343 Other mental disorders complicating pregnancy, third trimester: Secondary | ICD-10-CM | POA: Diagnosis not present

## 2024-03-13 DIAGNOSIS — F251 Schizoaffective disorder, depressive type: Secondary | ICD-10-CM

## 2024-03-13 DIAGNOSIS — O3403 Maternal care for unspecified congenital malformation of uterus, third trimester: Secondary | ICD-10-CM | POA: Diagnosis not present

## 2024-03-13 DIAGNOSIS — Z3400 Encounter for supervision of normal first pregnancy, unspecified trimester: Secondary | ICD-10-CM

## 2024-03-13 DIAGNOSIS — Q513 Bicornate uterus: Secondary | ICD-10-CM

## 2024-03-13 DIAGNOSIS — F259 Schizoaffective disorder, unspecified: Secondary | ICD-10-CM

## 2024-03-13 DIAGNOSIS — Z3480 Encounter for supervision of other normal pregnancy, unspecified trimester: Secondary | ICD-10-CM

## 2024-03-13 DIAGNOSIS — Z3A28 28 weeks gestation of pregnancy: Secondary | ICD-10-CM

## 2024-03-13 NOTE — Assessment & Plan Note (Signed)
 Reviewed kick counts and preterm labor warning signs. Instructed to call office or come to hospital with persistent headache, vision changes, regular contractions, leaking of fluid, decreased fetal movement or vaginal bleeding.

## 2024-03-13 NOTE — Assessment & Plan Note (Signed)
 Growth scheduled for 7/12

## 2024-03-13 NOTE — Assessment & Plan Note (Addendum)
 EDPS 16 today. Sometimes to thoughts of hurting herself Reviewed safety plan for Madison Boyer to include calling her best friend in [place]  or coming to triage if her thoughts of harm increase. She denies access to a weapon or a current plan to obtain one. She does not feel like she will hurt herself. She declines interventions today for inpatient behavioral health or to begin medications. She is open to therapy and said it has helped in the past. Her case worker for Medicaid happened to be here to meet with her and information was provider by her to the patient for support.

## 2024-03-27 ENCOUNTER — Encounter: Admitting: Advanced Practice Midwife

## 2024-03-29 ENCOUNTER — Encounter: Payer: Self-pay | Admitting: Advanced Practice Midwife

## 2024-03-29 ENCOUNTER — Ambulatory Visit (INDEPENDENT_AMBULATORY_CARE_PROVIDER_SITE_OTHER): Admitting: Advanced Practice Midwife

## 2024-03-29 VITALS — BP 112/75 | HR 96 | Wt 216.0 lb

## 2024-03-29 DIAGNOSIS — Z3A34 34 weeks gestation of pregnancy: Secondary | ICD-10-CM | POA: Diagnosis not present

## 2024-03-29 DIAGNOSIS — Z3403 Encounter for supervision of normal first pregnancy, third trimester: Secondary | ICD-10-CM

## 2024-03-29 NOTE — Patient Instructions (Addendum)
 Pregnancy: Healthy Eating While you're pregnant, your body needs extra nutrition for your growing baby. You also need more vitamins and minerals, such as folic acid, calcium, iron, and vitamin D . Eating a balanced diet is important for both you and your baby. Your need for extra calories will change during pregnancy. During the first 3 months of pregnancy, called the first trimester, you don't need more calories. During the second trimester, you'll need about 340 extra calories a day. During the third trimester, you'll need about 450 extra calories a day. If you're carrying more than one baby, talk with your health care provider or a dietitian to learn more about your specific eating needs. What are tips for eating healthy during pregnancy? Meal planning  Eating smaller meals throughout the day may help manage some side effects common in pregnancy, like heartburn and reflux. Eat a variety of foods. Be sure to include many types of fruits and vegetables. Two or more servings of fish are recommended each week. Choose fish that are lower in mercury, such as salmon and pollock. Limit foods that have empty calories. These are foods that have little nutritional value, such as sweets, desserts, candies, and drinks with sugar in them. Drinks that have caffeine are OK to drink, but it's better to avoid caffeine. Limit your total caffeine intake to less than 200 mg each day, or the limit you're told by your provider. Be aware that 200 mg of caffeine is 12 oz or 355 mL of coffee, tea, or soda. General information Take a prenatal vitamin to help meet your vitamin and mineral needs during pregnancy. This includes your need for folic acid, iron, calcium, and vitamin D . Do not try to lose weight or go on a diet during pregnancy. Food safety  Wash your hands before you eat and after you prepare raw meat. Wash all fruits and vegetables well before peeling or eating. Make sure that all meats, poultry, and  eggs are cooked to food-safe temperatures or well-done. Taking these actions can help keep your food safe and protect you and your baby from dangerous food illnesses. Ask your provider for more information. What foods should I eat? Fruits All fruits. Eat a variety of colors and types of fruit. Remember to wash your fruits well before peeling or eating. Vegetables All vegetables. Eat a variety of colors and types of vegetables. Remember to wash your vegetables well before peeling or eating. Grains All grains. Choose whole grains, such as whole-wheat bread, oatmeal, or brown rice. Meats and other protein foods Lean meats, including chicken, [place], and lean cuts of beef, veal, or pork. Fish that is higher in omega-3 fatty acids and lower in mercury, such as salmon, herring, mussels, trout, sardines, pollock, shrimp, crab, and lobster. Tofu. Tempeh. Beans. Eggs. Peanut butter and other nut butters. Dairy Pasteurized milk and milk alternatives, such as soy milk. Pasteurized yogurt and pasteurized cheese. Cottage cheese. Sour cream. Beverages Water. Juices that contain 100% fruit juice or vegetable juice. Caffeine-free teas and decaffeinated coffee. Fats and oils Fats and oils are OK to include in moderation. Sweets and desserts Sweets and desserts are OK to include in moderation. Seasoning and other foods All pasteurized condiments. The items listed above may not be all the foods and drinks you can have. Talk with a dietitian to learn more. What does 340 extra calories look like? Healthy snacks that give you 340 more calories a day could be: Peanut butter and jelly with milk: 8 oz (237 mL) of  low-fat milk. Peanut butter and jelly sandwich made with: 1 slice of whole-wheat bread. 2 teaspoons (10 g) of peanut butter. Yogurt and berries: 1 cup (245 g) of [place] yogurt. 1 cup (150 g) of berries. 2 tablespoons (30 g) of chopped nuts, such as almonds or walnuts. Avocado toast: 1 slice of  whole-wheat bread. 1/2 medium avocado (70 g). 1 large egg (50 g). What foods should I avoid? Fruits Raw (unpasteurized) fruit juices. Vegetables Unpasteurized vegetable juices. Meats and other protein foods Precooked or cured meat, such as bologna, hot dogs, sausages, or meat loaves. (If you must eat those meats, reheat them until they are steaming hot.) Refrigerated pate, meat spreads from a meat counter, or smoked seafood that's found in the refrigerated section of a store. Raw or undercooked meats, poultry, and eggs. Raw fish, such as sushi or sashimi. Fish that have high mercury content, such as tilefish, shark, swordfish, and king mackerel. Dairy Unpasteurized or raw milk and any foods that are made from them. Some of these may be: Homemade yogurts or puddings. Soft cheeses such as: Feta. Queso blanco or fresco. Pharmacist, hospital or [place]. Blue-veined cheeses. Some of these types of cheeses may be made with pasteurized milk. Check the label. If pasteurized milk is used, they are OK to eat during pregnancy. Deli foods Premade foods from a store or deli, like chicken salad, coleslaw, or egg salad. These are riskier for food illness than fresh or homemade salads. Beverages Alcohol. Sugar-sweetened drinks, such as sodas or teas. Energy drinks. Seasoning and other foods Homemade fermented foods and drinks, such as: Pickles. Sauerkraut. Kombucha. Store-bought pasteurized versions of these are OK. The items listed above may not be all the foods and drinks you should avoid. Talk with a dietitian to learn more. Where to find more information To learn more, go to: Centers for Disease Control and Prevention at TonerPromos.no. Click Search and type food choices for pregnancy. Find the link you need. MyPlate at http://pittman-dennis.biz/. This information is not intended to replace advice given to you by your health care provider. Make sure you discuss any questions you have with your health care  provider. Document Revised: 09/06/2023 Document Reviewed: 09/06/2023 Elsevier Patient Education  2025 Else[employer] Inc.Exercise During Pregnancy Exercise is an important part of being healthy for people of all ages. Exercise helps your heart and lungs work well. Exercise also: Helps you stay strong and flexible. Helps you keep a healthy body weight. Boosts your energy levels and improves your mood. You should try to exercise regularly during pregnancy. Exercise routines may need to change later in your pregnancy. In rare cases, certain medical problems in your pregnancy may limit the exercise you can do during pregnancy. Your health care provider will give you information on what exercises will work for you. How does exercise help during pregnancy? Along with staying strong and flexible, exercising during pregnancy can help: Keep strength in muscles that are used during labor and birth. Control weight gain. Speed up your recovery after giving birth. Reduce the need for insulin if you get diabetes during pregnancy. Decrease low back pain. Lower the risk for depression. Lower the risk of cesarean delivery. Treat trouble pooping (constipation). How does exercise affect my baby? Exercise can help you have a healthy pregnancy. Exercise does not cause your baby to be born early. It will not cause your baby to weigh less at birth. What exercises can I do? Many exercises are safe for you to do during pregnancy. Do a variety of  exercises that safely increase your heart and breathing rates and help you build and maintain muscle strength. Do exercises as told by your provider. Your provider may recommend: Walking. Swimming. Water aerobics. Riding a stationary bike. Modified yoga or Pilates. Tell your instructor that you're pregnant. Avoid overstretching. Avoid lying on your back for long periods of time. Resistance exercises with weights or elastic bands. Running or jogging. Choose this type of  exercise only if: You ran or jogged regularly before your pregnancy. You can run or jog and still talk in full sentences. What exercises should I avoid? You may be told to limit high-intensity exercise depending on your level of fitness and if you exercised regularly before you became pregnant. You can tell that you're exercising at a high intensity if you're breathing much harder and faster and can't hold a conversation while exercising. You may be told to: Avoid jogging or running, unless you jogged or ran regularly before you became pregnant. Do not run or jog so fast that you're unable to have a conversation. Avoid activities that put you at risk for falling on your belly or getting hit in the belly. Some of these are: Downhill skiing. Rock climbing. Cycling and gymnastics. Horseback riding. Surfing and waterskiing. Contact sports. Avoid scuba diving. Avoid skydiving. Avoid activities that take place in a room that's heated to high temperatures, such as hot yoga or hot Pilates. How do I exercise in a safe way?  Start slowly. Ask your provider to recommend the types of exercise that are safe for you. Avoid overheating. Do not exercise in very high temperatures or hot rooms. Avoid hot yoga or hot Pilates. Avoid standing still or lying flat on your back as much as you can. Avoid losing too much fluid (dehydration). Drink more fluids as told. Drink before, during, and after you exercise. Avoid overstretching. Because of hormone changes during pregnancy, it's easy to overstretch muscles, tendons, and ligaments. Ligaments are the tissues that connect bones to each other. Do not exercise to lose weight. Do not exercise at more than 6,000 feet above sea level (high elevation) if you don't live at that elevation. Tips and recommendations Wear loose-fitting, breathable clothes. Wear a sports bra to support your breasts. Exercise on most days or all days of the week. Try to exercise for 30  minutes a day, 5 days a week. If problems come up during your pregnancy, you provider may tell you to limit some exercises or to exercise less. If you have concerns, ask your provider. If you actively exercised before your pregnancy, your provider may tell you to continue to do moderate-intensity to high-intensity exercise. If you're just starting to exercise or didn't exercise much before your pregnancy, your provider may tell you to do low-intensity to moderate-intensity exercise. Questions to ask your health care provider Is exercise safe for me? What are signs that I should stop exercising? Does my health condition mean that I should not exercise during pregnancy? When should I avoid exercising during pregnancy? Stop exercising and contact a health care provider if: You have any unusual symptoms, such as: Mild contractions or cramps in the belly. Dizziness that does not go away when you rest. Headache. Pain and swelling of your calves. Bleeding or fluid leaking from your vagina. Stop exercising and get help right away if: You have: Chest pain. Shortness of breath. Sudden, severe pain in your low back or your belly. Regular, painful contractions before 37 weeks of pregnancy. These symptoms may be an  emergency. Call 911 right away. Do not wait to see if the symptoms will go away. Do not drive yourself to the hospital. This information is not intended to replace advice given to you by your health care provider. Make sure you discuss any questions you have with your health care provider. Document Revised: 05/15/2023 Document Reviewed: 05/15/2023 Elsevier Patient Education  2024 Else[employer] Inc.Perinatal Depression Perinatal depression is a mental health condition that occurs when a person is pregnant or just after the baby is born. If it's not managed properly, this condition can cause serious problems for the mother, baby, and loved ones. Symptoms of this condition are stronger than  feeling sad or down for a few days. If you have symptoms of depression that worsen or that last for more than 2 weeks, talk with your health care provider. This may be perinatal depression. What are the causes? The cause of this condition is not known. Change in hormones during and after pregnancy may cause some of the symptoms. What increases the risk? You're more likely to get this condition if: You have had depression, anxiety, or mood disorders before. You have family members who have depression, anxiety, or mood disorders. You had a stressful life event during pregnancy. You have a lot of stress in life. You do not have support from friends or loved ones. Your partner abuses you. What are the signs or symptoms? Symptoms of this condition include: Emotional symptoms. You may: Feel sad or hopeless. Feel guilty or feel that you're worthless. Feel irritable or overwhelmed. Feel that you are not able to take care of your baby. Have thoughts of harming yourself or your baby. Physical symptoms, such as: Changes in your appetite or sleep. Lack of energy or motivation. Headaches or stomach problems. Physical aches or pains. Changes in behavior, such as: Trouble concentrating or making decisions. Loss of interest in things or people. Trouble bonding with your baby. How is this diagnosed? This condition is diagnosed based on your symptoms and a physical exam. Your provider may also: Use a list of questions, also called a screening tool, that can help diagnose perinatal depression. Ask you to see a mental health expert who treats perinatal depression. How is this treated? This condition may be treated with: Therapy. Some types of therapy include: Interpersonal psychotherapy. Couples therapy. Cognitive behavioral therapy. Mother-child bonding therapy. Medicines. Your provider will tell you which medicines are safe during pregnancy or breastfeeding. Support groups. Brain stimulation  or light therapies. Therapies to reduce stress, such as: Mindfulness. Deep breathing. Guided muscle relaxation. Follow these instructions at home: Lifestyle Try to make time to care for yourself. Try to: Get as much rest as you can. Try to take a nap when the baby sleeps. Try to exercise as often as told by your provider. Ask your provider what exercises are safe for you. For some people, yoga and going for a walk can lessen stress. Do things that you enjoy. Take a bath, read, or listen to music or a podcast. Ask for help when you need it. Do not try to do everything yourself. Ask friends and family members to help with meals, child care, or cleaning, even for short times. Eat a healthy diet. Do not smoke, vape, or use any products that have nicotine or tobacco. If you need help quitting, ask your provider. Do not drink alcohol when you're pregnant. It's also safest not to drink alcohol if you're breastfeeding. If you drink alcohol after your baby is born: Limit  how much you have to 0-1 drink a day. Be aware of how much alcohol is in your drink. In the [place] one drink equals one 12 oz bottle of beer (355 mL), one 5 oz glass of wine (148 mL), or one 1 oz glass of hard liquor (44 mL). General instructions Take over-the-counter and prescription medicines only as told by your provider. Talk to people close to you about how you're feeling. Share your concerns, needs, or anxieties you may have. Let others know what you need. Try to join a support group for new mothers. Ask your provider about groups you can join. Keep all follow-up visits. Your provider needs to make sure the medicines or therapies are helping you. Contact a health care provider if: You or people close to you notice that you have new mental health symptoms. Your symptoms of depression get worse. You take medicines and have side effects that are not good or hard to tolerate. Get help right away if: You see or hear things  that are not there. Get help right away if you feel like you may hurt yourself or others, or have thoughts about taking your own life. Go to your nearest emergency room or: Call 911. Call the National Suicide Prevention Lifeline at (647)877-8054 or 988. This is open 24 hours a day. Text the Crisis Text Line at (772) 731-1033. This information is not intended to replace advice given to you by your health care provider. Make sure you discuss any questions you have with your health care provider. Document Revised: 01/06/2023 Document Reviewed: 01/06/2023 Elsevier Patient Education  2024 Elsevier Inc.Managing Depression, Adult Depression is a mental health condition that affects your thoughts, feelings, and actions. Being diagnosed with depression can bring you relief if you did not know why you have felt or behaved a certain way. It could also leave you feeling overwhelmed. Finding ways to manage your symptoms can help you feel more positive about your future. How to manage lifestyle changes Being depressed is difficult. Depression can increase the level of everyday stress. Stress can make depression symptoms worse. You may believe your symptoms cannot be managed or will never improve. However, there are many things you can try to help manage your symptoms. There is hope. Managing stress  Stress is your body's reaction to life changes and events, both good and bad. Stress can add to your feelings of depression. Learning to manage your stress can help lessen your feelings of depression. Try some of the following approaches to reducing your stress (stress reduction techniques): Listen to music that you enjoy and that inspires you. Try using a meditation app or take a meditation class. Develop a practice that helps you connect with your spiritual self. Walk in nature, pray, or go to a place of worship. Practice deep breathing. To do this, inhale slowly through your nose. Pause at the top of your inhale for a  few seconds and then exhale slowly, letting yourself relax. Repeat this three or four times. Practice yoga to help relax and work your muscles. Choose a stress reduction technique that works for you. These techniques take time and practice to develop. Set aside 5-15 minutes a day to do them. Therapists can offer training in these techniques. Do these things to help manage stress: Keep a journal. Know your limits. Set healthy boundaries for yourself and others, such as saying no when you think something is too much. Pay attention to how you react to certain situations. You may not be  able to control everything, but you can change your reaction. Add humor to your life by watching funny movies or shows. Make time for activities that you enjoy and that relax you. Spend less time using electronics, especially at night before bed. The light from screens can make your brain think it is time to get up rather than go to bed.  Medicines Medicines, such as antidepressants, are often a part of treatment for depression. Talk with your pharmacist or health care provider about all the medicines, supplements, and herbal products that you take, their possible side effects, and what medicines and other products are safe to take together. Make sure to report any side effects you may have to your health care provider. Relationships Your health care provider may suggest family therapy, couples therapy, or individual therapy as part of your treatment. How to recognize changes Everyone responds differently to treatment for depression. As you recover from depression, you may start to: Have more interest in doing activities. Feel more hopeful. Have more energy. Eat a more regular amount of food. Have better mental focus. It is important to recognize if your depression is not getting better or is getting worse. The symptoms you had in the beginning may return, such as: Feeling tired. Eating too much or too  little. Sleeping too much or too little. Feeling restless, agitated, or hopeless. Trouble focusing or making decisions. Having unexplained aches and pains. Feeling irritable, angry, or aggressive. If you or your family members notice these symptoms coming back, let your health care provider know right away. Follow these instructions at home: Activity Try to get some form of exercise each day, such as walking. Try yoga, mindfulness, or other stress reduction techniques. Participate in group activities if you are able. Lifestyle Get enough sleep. Cut down on or stop using caffeine, tobacco, alcohol, and any other harmful substances. Eat a healthy diet that includes plenty of vegetables, fruits, whole grains, low-fat dairy products, and lean protein. Limit foods that are high in solid fats, added sugar, or salt (sodium). General instructions Take over-the-counter and prescription medicines only as told by your health care provider. Keep all follow-up visits. It is important for your health care provider to check on your mood, behavior, and medicines. Your health care provider may need to make changes to your treatment. Where to find support Talking to others  Friends and family members can be sources of support and guidance. Talk to trusted friends or family members about your condition. Explain your symptoms and let them know that you are working with a health care provider to treat your depression. Tell friends and family how they can help. Finances Find mental health providers that fit with your financial situation. Talk with your health care provider if you are worried about access to food, housing, or medicine. Call your insurance company to learn about your co-pays and prescription plan. Where to find more information You can find support in your area from: Anxiety and Depression Association of [place] (ADAA): adaa.org Mental Health [place]: mentalhealthamerica.net [employer]  on Mental Illness: nami.org Contact a health care provider if: You stop taking your antidepressant medicines, and you have any of these symptoms: Nausea. Headache. Light-headedness. Chills and body aches. Not being able to sleep (insomnia). You or your friends and family think your depression is getting worse. Get help right away if: You have thoughts of hurting yourself or others. Get help right away if you feel like you may hurt yourself or others, or have  thoughts about taking your own life. Go to your nearest emergency room or: Call 911. Call the National Suicide Prevention Lifeline at 864-543-3953 or 988. This is open 24 hours a day. Text the Crisis Text Line at (862) 385-0503. This information is not intended to replace advice given to you by your health care provider. Make sure you discuss any questions you have with your health care provider. Document Revised: 01/25/2022 Document Reviewed: 01/25/2022 Elsevier Patient Education  2024 Elsevier Inc.Managing Anxiety, Adult After being diagnosed with anxiety, you may be relieved to know why you have felt or behaved a certain way. You may also feel overwhelmed about the treatment ahead and what it will mean for your life. With care and support, you can manage your anxiety. How to manage lifestyle changes Understanding the difference between stress and anxiety Although stress can play a role in anxiety, it is not the same as anxiety. Stress is your body's reaction to life changes and events, both good and bad. Stress is often caused by something external, such as a deadline, test, or competition. It normally goes away after the event has ended and will last just a few hours. But, stress can be ongoing and can lead to more than just stress. Anxiety is caused by something internal, such as imagining a terrible outcome or worrying that something will go wrong that will greatly upset you. Anxiety often does not go away even after the event is over, and  it can become a long-term (chronic) worry. Lowering stress and anxiety Talk with your health care provider or a counselor to learn more about lowering anxiety and stress. They may suggest tension-reduction techniques, such as: Music. Spend time creating or listening to music that you enjoy and that inspires you. Mindfulness-based meditation. Practice being aware of your normal breaths while not trying to control your breathing. It can be done while sitting or walking. Centering prayer. Focus on a word, phrase, or sacred image that means something to you and brings you peace. Deep breathing. Expand your stomach and inhale slowly through your nose. Hold your breath for 3-5 seconds. Then breathe out slowly, letting your stomach muscles relax. Self-talk. Learn to notice and spot thought patterns that lead to anxiety reactions. Change those patterns to thoughts that feel peaceful. Muscle relaxation. Take time to tense muscles and then relax them. Choose a tension-reduction technique that fits your lifestyle and personality. These techniques take time and practice. Set aside 5-15 minutes a day to do them. Specialized therapists can offer counseling and training in these techniques. The training to help with anxiety may be covered by some insurance plans. Other things you can do to manage stress and anxiety include: Keeping a stress diary. This can help you learn what triggers your reaction and then learn ways to manage your response. Thinking about how you react to certain situations. You may not be able to control everything, but you can control your response. Making time for activities that help you relax and not feeling guilty about spending your time in this way. Doing visual imagery. This involves imagining or creating mental pictures to help you relax. Practicing yoga. Through yoga poses, you can lower tension and relax.  Medicines Medicines for anxiety include: Antidepressant medicines. These are  usually prescribed for long-term daily control. Anti-anxiety medicines. These may be added in severe cases, especially when panic attacks occur. When used together, medicines, psychotherapy, and tension-reduction techniques may be the most effective treatment. Relationships Relationships can play a big part  in helping you recover. Spend more time connecting with trusted friends and family members. Think about going to couples counseling if you have a partner, taking family education classes, or going to family therapy. Therapy can help you and others better understand your anxiety. How to recognize changes in your anxiety Everyone responds differently to treatment for anxiety. Recovery from anxiety happens when symptoms lessen and stop interfering with your daily life at home or work. This may mean that you will start to: Have better concentration and focus. Worry will interfere less in your daily thinking. Sleep better. Be less irritable. Have more energy. Have improved memory. Try to recognize when your condition is getting worse. Contact your provider if your symptoms interfere with home or work and you feel like your condition is not improving. Follow these instructions at home: Activity Exercise. Adults should: Exercise for at least 150 minutes each week. The exercise should increase your heart rate and make you sweat (moderate-intensity exercise). Do strengthening exercises at least twice a week. Get the right amount and quality of sleep. Most adults need 7-9 hours of sleep each night. Lifestyle  Eat a healthy diet that includes plenty of vegetables, fruits, whole grains, low-fat dairy products, and lean protein. Do not eat a lot of foods that are high in fats, added sugars, or salt (sodium). Make choices that simplify your life. Do not use any products that contain nicotine or tobacco. These products include cigarettes, chewing tobacco, and vaping devices, such as e-cigarettes. If you  need help quitting, ask your provider. Avoid caffeine, alcohol, and certain over-the-counter cold medicines. These may make you feel worse. Ask your pharmacist which medicines to avoid. General instructions Take over-the-counter and prescription medicines only as told by your provider. Keep all follow-up visits. This is to make sure you are managing your anxiety well or if you need more support. Where to find support You can get help and support from: Self-help groups. Online and [employer]. A trusted spiritual leader. Couples counseling. Family education classes. Family therapy. Where to find more information You may find that joining a support group helps you deal with your anxiety. The following sources can help you find counselors or support groups near you: Mental Health America: mentalhealthamerica.net Anxiety and Depression Association of [place] (ADAA): adaa.org The First American on Mental Illness (NAMI): nami.org Contact a health care provider if: You have a hard time staying focused or finishing tasks. You spend many hours a day feeling worried about everyday life. You are very tired because you cannot stop worrying. You start to have headaches or often feel tense. You have chronic nausea or diarrhea. Get help right away if: Your heart feels like it is racing. You have shortness of breath. You have thoughts of hurting yourself or others. Get help right away if you feel like you may hurt yourself or others, or have thoughts about taking your own life. Go to your nearest emergency room or: Call 911. Call the National Suicide Prevention Lifeline at (716) 114-7891 or 988. This is open 24 hours a day. Text the Crisis Text Line at 443-613-6582. This information is not intended to replace advice given to you by your health care provider. Make sure you discuss any questions you have with your health care provider. Document Revised: 06/28/2022 Document Reviewed:  01/10/2021 Elsevier Patient Education  2024 Elsevier Inc.Managing Stress, Adult Feeling a certain amount of stress is normal. Stress helps our body and mind get ready to deal with the demands of life. Stress  hormones can motivate you to do well at work and meet your responsibilities. But severe or long-term (chronic) stress can affect your mental and physical health. Chronic stress puts you at higher risk for: Anxiety and depression. Other health problems such as digestive problems, muscle aches, heart disease, high blood pressure, and stroke. What are the causes? Common causes of stress include: Demands from work, such as deadlines, feeling overworked, or having long hours. Pressures at home, such as money issues, disagreements with a spouse, or parenting issues. Pressures from major life changes, such as divorce, moving, loss of a loved one, or chronic illness. You may be at higher risk for stress-related problems if you: Do not get enough sleep. Are in poor health. Do not have emotional support. Have a mental health disorder such as anxiety or depression. How to recognize stress Stress can make you: Have trouble sleeping. Feel sad, anxious, irritable, or overwhelmed. Lose your appetite. Overeat or want to eat unhealthy foods. Want to use drugs or alcohol. Stress can also cause physical symptoms, such as: Sore, tense muscles, especially in the shoulders and neck. Headaches. Trouble breathing. A faster heart rate. Stomach pain, nausea, or vomiting. Diarrhea or constipation. Trouble concentrating. Follow these instructions at home: Eating and drinking Eat a healthy diet. This includes: Eating foods that are high in fiber, such as beans, whole grains, and fresh fruits and vegetables. Limiting foods that are high in fat and processed sugars, such as fried or sweet foods. Do not skip meals or overeat. Drink enough fluid to keep your urine pale yellow. Alcohol use Do not drink  alcohol if: Your health care provider tells you not to drink. You are pregnant, may be pregnant, or are planning to become pregnant. Drinking alcohol is a way some people try to ease their stress. This can be dangerous, so if you drink alcohol: Limit how much you have to: 0-1 drink a day for women. 0-2 drinks a day for men. Know how much alcohol is in your drink. In the [place] one drink equals one 12 oz bottle of beer (355 mL), one 5 oz glass of wine (148 mL), or one 1 oz glass of hard liquor (44 mL). Activity  Include 30 minutes of exercise in your daily schedule. Exercise is a good stress reducer. Include time in your day for an activity that you find relaxing. Try taking a walk, going on a bike ride, reading a book, or listening to music. Schedule your time in a way that lowers stress, and keep a regular schedule. Focus on doing what is most important to get done. Lifestyle Identify the source of your stress and your reaction to it. See a therapist who can help you change unhelpful reactions. When there are stressful events: Talk about them with family, friends, or coworkers. Try to think realistically about stressful events and not ignore them or overreact. Try to find the positives in a stressful situation and not focus on the negatives. Cut back on responsibilities at work and home, if possible. Ask for help from friends or family members if you need it. Find ways to manage stress, such as: Mindfulness, meditation, or deep breathing. Yoga or tai chi. Progressive muscle relaxation. Spending time in nature. Doing art, playing music, or reading. Making time for fun activities. Spending time with family and friends. Get support from family, friends, or spiritual resources. General instructions Get enough sleep. Try to go to sleep and get up at about the same time every day. Take  over-the-counter and prescription medicines only as told by your health care provider. Do not use any  products that contain nicotine or tobacco. These products include cigarettes, chewing tobacco, and vaping devices, such as e-cigarettes. If you need help quitting, ask your health care provider. Do not use drugs or smoke to deal with stress. Keep all follow-up visits. This is important. Where to find support Talk with your health care provider about stress management or finding a support group. Find a therapist to work with you on your stress management techniques. Where to find more information The First American on Mental Illness: www.nami.org American Psychological Association: DiceTournament.ca Contact a health care provider if: Your stress symptoms get worse. You are unable to manage your stress at home. You are struggling to stop using drugs or alcohol. Get help right away if: You may be a danger to yourself or others. You have any thoughts of death or suicide. Get help right awayif you feel like you may hurt yourself or others, or have thoughts about taking your own life. Go to your nearest emergency room or: Call 911. Call the National Suicide Prevention Lifeline at 360-824-9780 or 988 in the [place] This is open 24 hours a day. If you're a Veteran: Call 988 and press 1. This is open 24 hours a day. Text the [employer] at (334) 108-6894. Summary Feeling a certain amount of stress is normal, but severe or long-term (chronic) stress can affect your mental and physical health. Chronic stress can put you at higher risk for anxiety, depression, and other health problems such as digestive problems, muscle aches, heart disease, high blood pressure, and stroke. You may be at higher risk for stress-related problems if you do not get enough sleep, are in poor health, lack emotional support, or have a mental health disorder such as anxiety or depression. Identify the source of your stress and your reaction to it. Try talking about stressful events with family, friends, or coworkers, finding a  coping method, or getting support from spiritual resources. If you need more help, talk with your health care provider about finding a support group or a mental health therapist. This information is not intended to replace advice given to you by your health care provider. Make sure you discuss any questions you have with your health care provider. Document Revised: 05/04/2023 Document Reviewed: 04/13/2021 Elsevier Patient Education  2024 [employer].

## 2024-03-29 NOTE — Progress Notes (Signed)
 Routine Prenatal Care Visit  Subjective  Madison Boyer is a 23 y.o. G1P0000 at [redacted]w[redacted]d being seen today for ongoing prenatal care.  She is currently monitored for the following issues for this low-risk pregnancy and has Schizoaffective disorder (HCC); Cannabis abuse; Overdose of antidepressant; Supervision of normal pregnancy; Bicornuate uterus; Obesity affecting pregnancy; Susceptible to varicella (non-immune), currently pregnant; Rubella non-immune status, antepartum; and Short cervix affecting pregnancy on their problem list.  ----------------------------------------------------------------------------------- Patient reports feeling fetal movement while at visit. She reports mood is improving- increased support from family. She declines medication at this time. Encouraged to eat healthy foods and increase physical activity, fresh air, sunshine for mood and ideal weight gain. Worried she has gained too much weight. Eating primarily fast foods. Also encouraged to reach out if her moods worsen.  Contractions: Not present. Vag. Bleeding: None.  Movement: (!) Decreased. Leaking Fluid denies.  ----------------------------------------------------------------------------------- The following portions of the patient's history were reviewed and updated as appropriate: allergies, current medications, past family history, past medical history, past social history, past surgical history and problem list. Problem list updated.  Objective  Blood pressure 112/75, pulse 96, weight 216 lb (98 kg), last menstrual period 08/03/2023. Pregravid weight 200 lb (90.7 kg) Total Weight Gain 16 lb (7.258 kg) Urinalysis: Urine Protein    Urine Glucose    Fetal Status: Fetal Heart Rate (bpm): 130   Movement: (!) Decreased     General:  Alert, oriented and cooperative. Patient is in no acute distress.  Skin: Skin is warm and dry. No rash noted.   Cardiovascular: Normal heart rate noted  Respiratory: Normal  respiratory effort, no problems with respiration noted  Abdomen: Soft, gravid, appropriate for gestational age. Pain/Pressure: Absent     Pelvic:  Cervical exam deferred        Extremities: Normal range of motion.  Edema: Trace  Mental Status: Normal mood and affect. Normal behavior. Normal judgment and thought content.   Assessment   22 y.o. G1P0000 at [redacted]w[redacted]d by  06/04/2024, by Ultrasound presenting for routine prenatal visit  Plan   January 2025 Problems (from 10/30/23 to present)     Problem Noted Diagnosed Resolved   Susceptible to varicella (non-immune), currently pregnant 12/08/2023 by Free, Lauraine PARAS, CNM  No   Rubella non-immune status, antepartum 12/08/2023 by Free, Lauraine PARAS, CNM  No   Obesity affecting pregnancy 12/04/2023 by Rutherford Lauraine PARAS, CNM  No   Overview Signed 12/04/2023 10:15 AM by Rutherford Lauraine PARAS, CNM  First/Second Trimester BMI 30-39 [ x] Baseline PIH labs, thyroid , HgbA1c [ x] Early 1 hour glucola [x ] Daily 81 mg ASA at 12 weeks with additional risk factor [ x] Discuss weight goals < 20 lbs TWG [x ] Detailed anatomy US   Third Trimester, if BMI now >40 [ ]  Repeat one hour glucola at 28 weeks [ ]  Growth US  q4 weeks starting at 28 weeks [ ]  Anesthesia consult with BMI > 45 [ ]  Weekly NSTs beginning at 36 weeks [ ]  Consider induction by 40 weeks       Supervision of normal pregnancy 10/30/2023 by Delana Rollo PARAS, CMA  No   Overview Addendum 03/29/2024 10:31 AM by Lynda Bradley, CNM   Clinical Staff Provider  Office Location  [facility] Dating  05/09/2024, Date entered prior to episode creation  Language  Spanish Anatomy US     Flu Vaccine  Declined Genetic Screen  NIPS:   TDaP vaccine   05/28 Hgb A1C or  GTT Early :  Third trimester : 103  Covid Declined   LAB RESULTS   Rhogam  O/Positive/-- (03/03 1029)  Blood Type O/Positive/-- (03/03 1029)   RSV  Antibody Negative (03/03 1029)  Feeding Plan breast Rubella <0.90 (03/03 1029)  Contraception  RPR Non Reactive  (03/03 1029)   Circumcision yes HBsAg Negative (03/03 1029)   Pediatrician  [facility] HIV Non Reactive (03/03 1029)  Support Person Boyfriend and Mother Varicella Non Reactive (03/03 1029)  Prenatal Classes yes GBS  (For PCN allergy, check sensitivities)     Hep C Non Reactive (03/03 1029)   BTL Consent  Pap No results found for: DIAGPAP  VBAC Consent  Hgb Electro      CF      SMA                    Preterm labor symptoms and general obstetric precautions including but not limited to vaginal bleeding, contractions, leaking of fluid and fetal movement were reviewed in detail with the patient. Please refer to After Visit Summary for other counseling recommendations.   Return in about 2 weeks (around 04/12/2024) for rob.  Slater Rains, CNM 03/29/2024 10:46 AM

## 2024-04-03 ENCOUNTER — Ambulatory Visit

## 2024-04-10 ENCOUNTER — Ambulatory Visit: Attending: Maternal & Fetal Medicine

## 2024-04-10 VITALS — BP 124/67 | HR 82

## 2024-04-10 DIAGNOSIS — O34592 Maternal care for other abnormalities of gravid uterus, second trimester: Secondary | ICD-10-CM | POA: Insufficient documentation

## 2024-04-10 DIAGNOSIS — Z3A32 32 weeks gestation of pregnancy: Secondary | ICD-10-CM | POA: Insufficient documentation

## 2024-04-10 DIAGNOSIS — Z2839 Other underimmunization status: Secondary | ICD-10-CM

## 2024-04-10 DIAGNOSIS — E669 Obesity, unspecified: Secondary | ICD-10-CM

## 2024-04-10 DIAGNOSIS — O34593 Maternal care for other abnormalities of gravid uterus, third trimester: Secondary | ICD-10-CM

## 2024-04-10 DIAGNOSIS — O26873 Cervical shortening, third trimester: Secondary | ICD-10-CM | POA: Diagnosis not present

## 2024-04-10 DIAGNOSIS — O26893 Other specified pregnancy related conditions, third trimester: Secondary | ICD-10-CM | POA: Diagnosis not present

## 2024-04-10 DIAGNOSIS — O99212 Obesity complicating pregnancy, second trimester: Secondary | ICD-10-CM

## 2024-04-10 DIAGNOSIS — Q513 Bicornate uterus: Secondary | ICD-10-CM | POA: Diagnosis not present

## 2024-04-10 DIAGNOSIS — O09899 Supervision of other high risk pregnancies, unspecified trimester: Secondary | ICD-10-CM

## 2024-04-10 DIAGNOSIS — O99213 Obesity complicating pregnancy, third trimester: Secondary | ICD-10-CM | POA: Insufficient documentation

## 2024-04-10 DIAGNOSIS — Z3403 Encounter for supervision of normal first pregnancy, third trimester: Secondary | ICD-10-CM

## 2024-04-12 ENCOUNTER — Encounter: Payer: Self-pay | Admitting: Certified Nurse Midwife

## 2024-04-12 ENCOUNTER — Other Ambulatory Visit (HOSPITAL_COMMUNITY)
Admission: RE | Admit: 2024-04-12 | Discharge: 2024-04-12 | Disposition: A | Discharge status: Home / Self Care | Source: Ambulatory Visit | Attending: Certified Nurse Midwife | Admitting: Certified Nurse Midwife

## 2024-04-12 ENCOUNTER — Ambulatory Visit (INDEPENDENT_AMBULATORY_CARE_PROVIDER_SITE_OTHER): Admitting: Certified Nurse Midwife

## 2024-04-12 VITALS — BP 130/79 | HR 93 | Wt 221.1 lb

## 2024-04-12 DIAGNOSIS — Z3A32 32 weeks gestation of pregnancy: Secondary | ICD-10-CM | POA: Diagnosis not present

## 2024-04-12 DIAGNOSIS — N898 Other specified noninflammatory disorders of vagina: Secondary | ICD-10-CM

## 2024-04-12 DIAGNOSIS — Q513 Bicornate uterus: Secondary | ICD-10-CM

## 2024-04-12 DIAGNOSIS — O26873 Cervical shortening, third trimester: Secondary | ICD-10-CM | POA: Diagnosis not present

## 2024-04-12 DIAGNOSIS — F250 Schizoaffective disorder, bipolar type: Secondary | ICD-10-CM

## 2024-04-12 DIAGNOSIS — Z3403 Encounter for supervision of normal first pregnancy, third trimester: Secondary | ICD-10-CM

## 2024-04-12 DIAGNOSIS — O26879 Cervical shortening, unspecified trimester: Secondary | ICD-10-CM

## 2024-04-12 NOTE — Assessment & Plan Note (Signed)
 Reviewed kick counts and preterm labor warning signs. Instructed to call office or come to hospital with persistent headache, vision changes, regular contractions, leaking of fluid, decreased fetal movement or vaginal bleeding.

## 2024-04-12 NOTE — Progress Notes (Signed)
    Return Prenatal Note   Subjective   23 y.o. G1P0000 at [redacted]w[redacted]d presents for this follow-up prenatal visit.  Patient feeling well, active baby. Reviewed normal growth on MFM ultrasound. Mood doing ok, is interested in referral for counseling through [facility]. Reports vaginal odor like BV, self swab collected. Patient reports: Movement: Present Contractions: Not present  Objective   Flow sheet Vitals: Pulse Rate: 93 BP: 130/79 Fundal Height: 31 cm Fetal Heart Rate (bpm): 140 Total weight gain: 21 lb 1.6 oz (9.571 kg)  General Appearance  No acute distress, well appearing, and well nourished Pulmonary   Normal work of breathing Neurologic   Alert and oriented to person, place, and time Psychiatric   Mood and affect within normal limits  Assessment/Plan   Plan  23 y.o. G1P0000 at [redacted]w[redacted]d presents for follow-up OB visit. Reviewed prenatal record including previous visit note.  Short cervix affecting pregnancy Has stopped progesterone .   Supervision of normal pregnancy Reviewed kick counts and preterm labor warning signs. Instructed to call office or come to hospital with persistent headache, vision changes, regular contractions, leaking of fluid, decreased fetal movement or vaginal bleeding.   Aptima collected for vaginal odor, will follow up with results. Discussed making plan for protected sleep in early postpartum period, she is unsure how supportive her mom will be after baby is born. In agreement with referral to [facility] for counseling, case manager Andriette Lambing messaged as well-Halen has talked to her during this pregnancy but not recently.   Orders Placed This Encounter  Procedures   Ambulatory referral to Behavioral Health    Referral Priority:   Routine    Referral Type:   Psychiatric    Referral Reason:   Specialty Services Required    Requested Specialty:   Behavioral Health    Number of Visits Requested:   1   Return in 2 weeks (on 04/26/2024) for ROB.   Future  Appointments  Date Time Provider Department Center  04/23/2024  9:55 AM Dominic, [name] Jansky, CNM [facility] None  06/06/2024  1:40 PM Liana Fish, NP [facility] None    For next visit:  continue with routine prenatal care     Harlene LITTIE Cisco, CNM  07/11/253:29 PM

## 2024-04-12 NOTE — Assessment & Plan Note (Signed)
 Has stopped progesterone .

## 2024-04-12 NOTE — Patient Instructions (Signed)
 Third Trimester of Pregnancy  The third trimester of pregnancy is from week 28 through week 40. This is months 7 through 9. The third trimester is a time when your baby is growing fast. Body changes during your third trimester Your body continues to change during this time. The changes usually go away after your baby is born. Physical changes You will continue to gain weight. You may get stretch marks on your hips, belly, and breasts. Your breasts will keep growing and may hurt. A yellow fluid (colostrum) may leak from your breasts. This is the first milk you're making for your baby. Your hair may grow faster and get thicker. In some cases, you may get hair loss. Your belly button may stick out. You may have more swelling in your hands, face, or ankles. Health changes You may have heartburn. You may feel short of breath. This is caused by the uterus that is now bigger. You may have more aches in the pelvis, back, or thighs. You may have more tingling or numbness in your hands, arms, and legs. You may pee more often. You may have trouble pooping (constipation) or swollen veins in the butt that can itch or get painful (hemorrhoids). Other changes You may have more problems sleeping. You may notice the baby moving lower in your belly (dropping). You may have more fluid coming from your vagina. Your joints may feel loose, and you may have pain around your pelvic bone. Follow these instructions at home: Medicines Take medicines only as told by your health care provider. Some medicines are not safe during pregnancy. Your provider may change the medicines that you take. Do not take any medicines unless told to by your provider. Take a prenatal vitamin that has at least 600 micrograms (mcg) of folic acid. Do not use herbal medicines, illegal drugs, or medicines that are not approved by your provider. Eating and drinking While you're pregnant your body needs additional nutrition to help  support your growing baby. Talk with your provider about your nutritional needs. Activity Most women are able to exercise regularly during pregnancy. Exercise routines may need to change at the end of your pregnancy. Talk to your provider about your activities and exercise routine. Relieving pain and discomfort Rest often with your legs raised if you have leg cramps or low back pain. Take warm sitz baths to soothe pain from hemorrhoids. Use hemorrhoid cream if your provider says it's okay. Wear a good, supportive bra if your breasts hurt. Do not use hot tubs, steam rooms, or saunas. Do not douche. Do not use tampons or scented pads. Safety Talk to your provider before traveling far distances. Wear your seatbelt at all times when you're in a car. Talk to your provider if someone hits you, hurts you, or yells at you. Preparing for birth To prepare for your baby: Take childbirth and breastfeeding classes. Visit the hospital and tour the maternity area. Buy a rear-facing car seat. Learn how to install it in your car. General instructions Avoid cat litter boxes and soil used by cats. These things carry germs that can cause harm to your pregnancy and your baby. Do not drink alcohol, smoke, vape, or use products with nicotine or tobacco in them. If you need help quitting, talk with your provider. Keep all follow-up visits for your third trimester. Your provider will do more exams and tests during this trimester. Write down your questions. Take them to your prenatal visits. Your provider also will: Talk with you about  your overall health. Give you advice or refer you to specialists who can help with different needs, including: Mental health and counseling. Foods and healthy eating. Ask for help if you need help with food. Where to find more information American Pregnancy Association: americanpregnancy.org [employer] of Obstetricians and Gynecologists: acog.org Office on [employer] Health:  TravelLesson.ca Contact a health care provider if: You have a headache that does not go away when you take medicine. You have any of these problems: You can't eat or drink. You have nausea and vomiting. You have watery poop (diarrhea) for 2 days or more. You have pain when you pee, or your pee smells bad. You have been sick for 2 days or more and aren't getting better. Contact your provider right away if: You have any of these coming from your vagina: Abnormal discharge. Bad-smelling fluid. Bleeding. Your baby is moving less than usual. You have signs of labor: You have any contractions, belly cramping, or have pain in your pelvis or lower back before 37 weeks of pregnancy (preterm labor). You have regular contractions that are less than 5 minutes apart. Your water breaks. You have symptoms of high blood pressure or preeclampsia. These include: A severe, throbbing headache that does not go away. Sudden or extreme swelling of your face, hands, legs, or feet. Vision problems: You see spots. You have blurry vision. Your eyes are sensitive to light. If you can't reach your provider, go to an urgent care or emergency room. Get help right away if: You faint, become confused, or can't think clearly. You have chest pain or trouble breathing. You have any kind of injury, such as from a fall or a car crash. These symptoms may be an emergency. Call 911 right away. Do not wait to see if the symptoms will go away. Do not drive yourself to the hospital. This information is not intended to replace advice given to you by your health care provider. Make sure you discuss any questions you have with your health care provider. Document Revised: 06/22/2023 Document Reviewed: 01/20/2023 Elsevier Patient Education  2024 [employer].

## 2024-04-14 ENCOUNTER — Telehealth: Admitting: Family Medicine

## 2024-04-14 DIAGNOSIS — Z3A32 32 weeks gestation of pregnancy: Secondary | ICD-10-CM | POA: Diagnosis not present

## 2024-04-14 DIAGNOSIS — R21 Rash and other nonspecific skin eruption: Secondary | ICD-10-CM

## 2024-04-14 DIAGNOSIS — O368130 Decreased fetal movements, third trimester, not applicable or unspecified: Secondary | ICD-10-CM

## 2024-04-14 NOTE — Patient Instructions (Signed)
 Rash, Adult A rash is a breakout of spots or blotches on the skin. It can affect the way the skin looks and feels. Many things can cause a rash. Common causes include: Viral infections. These include colds, measles, and hand, foot, and mouth disease. Bacterial infections. These include scarlet fever and impetigo. Fungal infections. These include athlete's foot, ringworm, and yeast rashes. Skin irritation. This may be from heat rash, exposure to moisture or friction for a long time (intertrigo), or exposure to soap or skin care products (eczema). Allergic reactions. These may be caused by foods, medicines, or things like poison ivy. Some rashes may go away after a few days. Others may last for a few weeks. The goal of treatment is to stop the itching and keep the rash from spreading. Follow these instructions at home: Medicine Take or apply over-the-counter and prescription medicines only as told by your health care provider. These may include: Corticosteroids. These can help treat red or swollen skin. They may be given as creams or as medicines to take by mouth (orally). Anti-itch lotions. Allergy medicines. Pain medicine. Antifungal medicine if the rash is from a fungal infection. Antibiotics if you have an infection.  Skin care Apply cool, wet cloths (compresses) to the affected areas. Do not scratch or rub your skin. Avoid covering the rash. Keep it exposed to air as often as you can. Managing itching and discomfort Avoid hot showers and baths. These can make itching worse. A cold shower may help. Try taking a bath with: Epsom salts. You can get these at your local pharmacy or grocery store. Follow the instructions on the package. Baking soda. Pour a small amount into the bath as told by your provider. Colloidal oatmeal. You can get this at your local pharmacy or grocery store. Follow the instructions on the package. Try putting baking soda paste on your skin. Stir water into baking  soda until it becomes like a paste. Try using calamine lotion or cortisone cream to help with itchiness. Keep cool. Stay out of the sun. Sweating and being hot can make itching worse. General instructions  Rest as needed. Drink enough fluid to keep your pee (urine) pale yellow. Wear loose-fitting clothes. Avoid scented soaps, detergents, and perfumes. Use gentle soaps, detergents, perfumes, and cosmetics. Avoid the things that cause your rash (triggers). Keep a journal to help keep track of your triggers. Write down: What you eat. What cosmetics you use. What you drink. What you wear. This includes jewelry. Contact a health care provider if: You sweat at night more than normal. You pee (urinate) more or less than normal, or your pee is a darker color than normal. Your eyes become sensitive to light. Your skin or the white parts of your eyes turn yellow (jaundice). Your skin tingles or is numb. You get painful blisters in your nose or mouth. Your rash does not go away after a few days, or it gets worse. You are more tired or thirsty than normal. You have new or worse symptoms. These may include: Pain in your abdomen. Fever. Diarrhea or vomiting. Weakness or weight loss. Get help right away if: You get confused. You have a severe headache, a stiff neck, or severe joint pain or stiffness. You become very sleepy or not responsive. You have a seizure. This information is not intended to replace advice given to you by your health care provider. Make sure you discuss any questions you have with your health care provider. Document Revised: 07/08/2022 Document Reviewed:  07/08/2022 Elsevier Patient Education  2024 [employer].

## 2024-04-14 NOTE — Progress Notes (Signed)
 Virtual Visit Consent   Madison Boyer, you are scheduled for a virtual visit with a [facility] provider today. Just as with appointments in the office, your consent must be obtained to participate. Your consent will be active for this visit and any virtual visit you may have with one of our providers in the next 365 days. If you have a MyChart account, a copy of this consent can be sent to you electronically.  As this is a virtual visit, video technology does not allow for your provider to perform a traditional examination. This may limit your provider's ability to fully assess your condition. If your provider identifies any concerns that need to be evaluated in person or the need to arrange testing (such as labs, EKG, etc.), we will make arrangements to do so. Although advances in technology are sophisticated, we cannot ensure that it will always work on either your end or our end. If the connection with a video visit is poor, the visit may have to be switched to a telephone visit. With either a video or telephone visit, we are not always able to ensure that we have a secure connection.  By engaging in this virtual visit, you consent to the provision of healthcare and authorize for your insurance to be billed (if applicable) for the services provided during this visit. Depending on your insurance coverage, you may receive a charge related to this service.  I need to obtain your verbal consent now. Are you willing to proceed with your visit today? Madison Boyer has provided verbal consent on 04/14/2024 for a virtual visit (video or telephone). Loa Lamp, FNP  Date: 04/14/2024 3:24 PM   Virtual Visit via Video Note   I, Loa Lamp, connected with  Madison Boyer  ([id number], [birth year]) on 04/14/24 at  3:15 PM EDT by a video-enabled telemedicine application and verified that I am speaking with the correct person using two identifiers.  Location: Patient: Virtual Visit  Location Patient: Home Provider: Virtual Visit Location Provider: Home Office   I discussed the limitations of evaluation and management by telemedicine and the availability of in person appointments. The patient expressed understanding and agreed to proceed.    History of Present Illness: Madison Boyer is a 23 y.o. who identifies as a female who was assigned female at birth, and is being seen today for decreased fetal movements this am but reports she felt baby move this afternoon. She has a rash on left ankle open and red, not itching. 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 (Patient not taking: Reported on 04/12/2024), Disp: , Rfl:    aspirin  EC 81 MG tablet, Take 1 tablet (81 mg total) by mouth daily. Take after 12 weeks for prevention of preeclampssia later in pregnancy (Patient not taking: Reported on 04/12/2024), Disp: 300 tablet, Rfl: 2   Prenatal MV-Min-FA-Omega-3 (VITAFUSION PRENATAL PO), Take by mouth., Disp: , Rfl:    Prenatal Vit-Fe Fumarate-FA (MULTIVITAMIN-PRENATAL) 27-0.8 MG TABS  tablet, Take 1 tablet by mouth daily at 12 noon. (Patient not taking: Reported on 04/12/2024), Disp: 30 tablet, Rfl: 11   progesterone  (PROMETRIUM ) 200 MG capsule, Place one capsule vaginally at bedtime (Patient not taking: Reported on 04/12/2024), Disp: 30 capsule, Rfl: 6   triamcinolone  cream (KENALOG ) 0.1 %, Apply 1 Application  topically 2 (two) times daily as needed (eczema/rash). (Patient not taking: Reported on 04/12/2024), Disp: 80 g, Rfl: 3   valACYclovir  (VALTREX ) 500 MG tablet, Take 1 tablet (500 mg total) by mouth 2 (two) times daily. During flare ups. (Patient not taking: Reported on 04/12/2024), Disp: 60 tablet, Rfl: 2  Observations/Objective: Patient is well-developed, well-nourished in no acute distress.  Resting comfortably  at home.  Head is normocephalic, atraumatic.  No labored breathing.  Speech is clear and coherent with logical content.  Patient is alert and oriented at baseline.    Assessment and Plan: 1. [redacted] weeks gestation of pregnancy (Primary)  2. Decreased fetal movements in third trimester, single or unspecified fetus  3. Rash  Go to the ED for decrease in fetal movements. She says the baby has been moving however I have stressed the need to go to ED if there is any question of whether the baby is moving. Clean rash with warm soapy water, pat dry and apply antibiotic ointment. UC if sx worsen.  Follow Up Instructions: I discussed the assessment and treatment plan with the patient. The patient was provided an opportunity to ask questions and all were answered. The patient agreed with the plan and demonstrated an understanding of the instructions.  A copy of instructions were sent to the patient via MyChart unless otherwise noted below.     The patient was advised to call back or seek an in-person evaluation if the symptoms worsen or if the condition fails to improve as anticipated.    Caress Reffitt, FNP

## 2024-04-15 ENCOUNTER — Emergency Department

## 2024-04-15 ENCOUNTER — Other Ambulatory Visit: Payer: Self-pay | Admitting: Certified Nurse Midwife

## 2024-04-15 ENCOUNTER — Observation Stay
Admission: EM | Admit: 2024-04-15 | Discharge: 2024-04-15 | Disposition: A | Discharge status: Home / Self Care | Attending: Emergency Medicine | Admitting: Emergency Medicine

## 2024-04-15 ENCOUNTER — Other Ambulatory Visit: Payer: Self-pay

## 2024-04-15 DIAGNOSIS — O99213 Obesity complicating pregnancy, third trimester: Secondary | ICD-10-CM | POA: Insufficient documentation

## 2024-04-15 DIAGNOSIS — O99212 Obesity complicating pregnancy, second trimester: Secondary | ICD-10-CM

## 2024-04-15 DIAGNOSIS — E669 Obesity, unspecified: Secondary | ICD-10-CM | POA: Diagnosis not present

## 2024-04-15 DIAGNOSIS — Z3A32 32 weeks gestation of pregnancy: Secondary | ICD-10-CM | POA: Diagnosis not present

## 2024-04-15 DIAGNOSIS — Z7982 Long term (current) use of aspirin: Secondary | ICD-10-CM | POA: Diagnosis not present

## 2024-04-15 DIAGNOSIS — Z2839 Other underimmunization status: Secondary | ICD-10-CM

## 2024-04-15 DIAGNOSIS — O98513 Other viral diseases complicating pregnancy, third trimester: Secondary | ICD-10-CM | POA: Diagnosis not present

## 2024-04-15 DIAGNOSIS — O219 Vomiting of pregnancy, unspecified: Principal | ICD-10-CM

## 2024-04-15 DIAGNOSIS — Z3403 Encounter for supervision of normal first pregnancy, third trimester: Principal | ICD-10-CM

## 2024-04-15 LAB — CBC WITH DIFFERENTIAL/PLATELET
Abs Immature Granulocytes: 0.09 K/uL — ABNORMAL HIGH (ref 0.00–0.07)
Basophils Absolute: 0 K/uL (ref 0.0–0.1)
Basophils Relative: 0 %
Eosinophils Absolute: 0 K/uL (ref 0.0–0.5)
Eosinophils Relative: 0 %
HCT: 38.8 % (ref 36.0–46.0)
Hemoglobin: 13.8 g/dL (ref 12.0–15.0)
Immature Granulocytes: 1 %
Lymphocytes Relative: 6 %
Lymphs Abs: 0.8 K/uL (ref 0.7–4.0)
MCH: 31.2 pg (ref 26.0–34.0)
MCHC: 35.6 g/dL (ref 30.0–36.0)
MCV: 87.8 fL (ref 80.0–100.0)
Monocytes Absolute: 0.6 K/uL (ref 0.1–1.0)
Monocytes Relative: 5 %
Neutro Abs: 12 K/uL — ABNORMAL HIGH (ref 1.7–7.7)
Neutrophils Relative %: 88 %
Platelets: 232 K/uL (ref 150–400)
RBC: 4.42 MIL/uL (ref 3.87–5.11)
RDW: 13.2 % (ref 11.5–15.5)
WBC: 13.6 K/uL — ABNORMAL HIGH (ref 4.0–10.5)
nRBC: 0 % (ref 0.0–0.2)

## 2024-04-15 LAB — PROTEIN / CREATININE RATIO, URINE
Creatinine, Urine: 170 mg/dL
Protein Creatinine Ratio: 0.1 mg/mg{creat} (ref 0.00–0.15)
Total Protein, Urine: 17 mg/dL

## 2024-04-15 LAB — COMPREHENSIVE METABOLIC PANEL WITH GFR
ALT: 14 U/L (ref 0–44)
AST: 20 U/L (ref 15–41)
Albumin: 2.9 g/dL — ABNORMAL LOW (ref 3.5–5.0)
Alkaline Phosphatase: 173 U/L — ABNORMAL HIGH (ref 38–126)
Anion gap: 6 (ref 5–15)
BUN: 5 mg/dL — ABNORMAL LOW (ref 6–20)
CO2: 20 mmol/L — ABNORMAL LOW (ref 22–32)
Calcium: 8.8 mg/dL — ABNORMAL LOW (ref 8.9–10.3)
Chloride: 113 mmol/L — ABNORMAL HIGH (ref 98–111)
Creatinine, Ser: 0.5 mg/dL (ref 0.44–1.00)
GFR, Estimated: >60 mL/min (ref >60)
Glucose, Bld: 85 mg/dL (ref 70–99)
Potassium: 3.4 mmol/L — ABNORMAL LOW (ref 3.5–5.1)
Sodium: 139 mmol/L (ref 135–145)
Total Bilirubin: 0.6 mg/dL (ref 0.0–1.2)
Total Protein: 6.2 g/dL — ABNORMAL LOW (ref 6.5–8.1)

## 2024-04-15 LAB — URINALYSIS, ROUTINE W REFLEX MICROSCOPIC
Bilirubin Urine: NEGATIVE
Glucose, UA: NEGATIVE mg/dL
Hgb urine dipstick: NEGATIVE
Ketones, ur: 20 mg/dL — AB
Nitrite: NEGATIVE
Protein, ur: NEGATIVE mg/dL
Specific Gravity, Urine: 1.018 (ref 1.005–1.030)
pH: 5 (ref 5.0–8.0)

## 2024-04-15 LAB — LIPASE, BLOOD: Lipase: 28 U/L (ref 11–51)

## 2024-04-15 LAB — MAGNESIUM: Magnesium: 1.8 mg/dL (ref 1.7–2.4)

## 2024-04-15 MED ORDER — LACTATED RINGERS IV SOLN: INTRAVENOUS | Status: DC

## 2024-04-15 MED ORDER — ONDANSETRON HCL 4 MG/2ML IJ SOLN
4.0000 mg | Freq: Once | INTRAMUSCULAR | Status: AC
  Administered 2024-04-15: 4 mg via INTRAVENOUS
  Filled 2024-04-15: qty 2

## 2024-04-15 MED ORDER — LACTATED RINGERS IV BOLUS
1000.0000 mL | Freq: Once | INTRAVENOUS | Status: AC
  Administered 2024-04-15: 1000 mL via INTRAVENOUS

## 2024-04-15 MED ORDER — ONDANSETRON 4 MG PO TBDP: 4.0000 mg | ORAL_TABLET | Freq: Three times a day (TID) | ORAL | 0 refills | Status: DC | PRN

## 2024-04-15 MED ORDER — LACTATED RINGERS IV SOLN: 500.0000 mL | INTRAVENOUS | Status: DC | PRN

## 2024-04-15 MED ORDER — LACTATED RINGERS IV BOLUS
500.0000 mL | Freq: Once | INTRAVENOUS | Status: AC
  Administered 2024-04-15: 500 mL via INTRAVENOUS

## 2024-04-15 NOTE — OB Triage Note (Signed)
 23 y.o G1P0 presents to L&D triage for nausea/vomiting and for an NST. She mentions feeling nauseous this morning and threw up once, she endorses fetal movement and denies vaginal bleeding, lof, headache, epigastric pain, and abnormal swelling. She was evaluated in the ED including a pre-E workup that was wnl. NST was reactive, BP's are now wnl. Pt being discharged home stable and ambulatory with POC including returning to the office in 2 days for a BP check. Discharge instructions reviewed w pt and pt verbalized understanding.

## 2024-04-15 NOTE — ED Triage Notes (Signed)
 Pt to ED from home with hypertension as well as nausea/diarrhea since yesterday. Denies hx hypertension during pregnancy and states she does feel fetal movement. A&O x4

## 2024-04-15 NOTE — ED Provider Notes (Signed)
 [facility] Provider Note    Event Date/Time   First MD Initiated Contact with Patient 04/15/24 1015     (approximate)   History   Nausea (/) and Hypertension   HPI  Madison Boyer is a 23 y.o. female who presents to the ED for evaluation of Nausea (/) and Hypertension   Review of routine [facility] prenatal visit from 3 days ago.  G1 at 33 weeks  Patient presents to the ED for evaluation of nausea and emesis.  Reports an episode of suprapubic discomfort yesterday but no pain today.  No dysuria.  2 episodes of emesis this morning.  Reports blurry vision while actively vomiting, but none otherwise.  2 episodes of loose stool without vaginal bleeding, loss of fluid.  No fevers  She also reports a rash, bruising, to the left ankle despite no trauma or injuries.  Despite no trauma or injuries   Physical Exam   Triage Vital Signs: ED Triage Vitals  Encounter Vitals Group     BP 04/15/24 1010 (!) 151/107     Girls Systolic BP Percentile --      Girls Diastolic BP Percentile --      Boys Systolic BP Percentile --      Boys Diastolic BP Percentile --      Pulse Rate 04/15/24 1010 (!) 128     Resp 04/15/24 1010 18     Temp 04/15/24 1010 98.3 F (36.8 C)     Temp Source 04/15/24 1010 Oral     SpO2 04/15/24 1010 99 %     Weight --      Height 04/15/24 1012 5' 1 (1.549 m)     Head Circumference --      Peak Flow --      Pain Score 04/15/24 1011 0     Pain Loc --      Pain Education --      Exclude from Growth Chart --     Most recent vital signs: Vitals:   04/15/24 1445 04/15/24 1500  BP: 121/63 119/66  Pulse: 98 90  Resp:    Temp:    SpO2:      General: Awake, no distress.  CV:  Good peripheral perfusion.  Resp:  Normal effort.  Abd:  No distention.  Suprapubic, RUQ and LUQ tenderness without guarding or peritoneal features.  Mild MSK:  No deformity noted.  Lower extremity edema is noted she reports it is worse than normal  over the past couple days.  Bruising around the left ankle but full active and passive range of motion without pain.  Strong and symmetric DP pulse. Neuro:  No focal deficits appreciated. Other:     ED Results / Procedures / Treatments   Labs (all labs ordered are listed, but only abnormal results are displayed) Labs Reviewed  URINALYSIS, ROUTINE W REFLEX MICROSCOPIC - Abnormal; Notable for the following components:      Result Value   Color, Urine YELLOW (*)    APPearance HAZY (*)    Ketones, ur 20 (*)    Leukocytes,Ua MODERATE (*)    Bacteria, UA RARE (*)    All other components within normal limits  COMPREHENSIVE METABOLIC PANEL WITH GFR - Abnormal; Notable for the following components:   Potassium 3.4 (*)    Chloride 113 (*)    CO2 20 (*)    BUN 5 (*)    Calcium 8.8 (*)    Total Protein 6.2 (*)  Albumin 2.9 (*)    Alkaline Phosphatase 173 (*)    All other components within normal limits  CBC WITH DIFFERENTIAL/PLATELET - Abnormal; Notable for the following components:   WBC 13.6 (*)    Neutro Abs 12.0 (*)    Abs Immature Granulocytes 0.09 (*)    All other components within normal limits  MAGNESIUM   LIPASE, BLOOD  PROTEIN / CREATININE RATIO, URINE    EKG   RADIOLOGY Ultrasound of bilateral legs interpreted by me without signs of DVT RUQ ultrasound interpreted by me with no gallstones  Official radiology report(s): US  Venous Img Lower Bilateral Result Date: 04/15/2024 CLINICAL DATA:  pregnant, eval DVT. acutely worsening swelling, atraumatic bruising to LLE EXAM: BILATERAL LOWER EXTREMITY VENOUS DOPPLER ULTRASOUND TECHNIQUE: Gray-scale sonography with graded compression, as well as color Doppler and duplex ultrasound were performed to evaluate the lower extremity deep venous systems from the level of the common femoral vein and including the common femoral, femoral, profunda femoral, popliteal and calf veins including the posterior tibial, peroneal and gastrocnemius  veins when visible. The superficial great saphenous vein was also interrogated. Spectral Doppler was utilized to evaluate flow at rest and with distal augmentation maneuvers in the common femoral, femoral and popliteal veins. COMPARISON:  None Available. FINDINGS: RIGHT LOWER EXTREMITY Common Femoral Vein: No evidence of thrombus. Normal compressibility, respiratory phasicity and response to augmentation. Saphenofemoral Junction: No evidence of thrombus. Normal compressibility and flow on color Doppler imaging. Profunda Femoral Vein: No evidence of thrombus. Normal compressibility and flow on color Doppler imaging. Femoral Vein: No evidence of thrombus. Normal compressibility, respiratory phasicity and response to augmentation. Popliteal Vein: No evidence of thrombus. Normal compressibility, respiratory phasicity and response to augmentation. Calf Veins: No evidence of thrombus. Normal compressibility and flow on color Doppler imaging. Superficial Great Saphenous Vein: No evidence of thrombus. Normal compressibility. Other Findings:  None. LEFT LOWER EXTREMITY Common Femoral Vein: No evidence of thrombus. Normal compressibility, respiratory phasicity and response to augmentation. Saphenofemoral Junction: No evidence of thrombus. Normal compressibility and flow on color Doppler imaging. Profunda Femoral Vein: No evidence of thrombus. Normal compressibility and flow on color Doppler imaging. Femoral Vein: No evidence of thrombus. Normal compressibility, respiratory phasicity and response to augmentation. Popliteal Vein: No evidence of thrombus. Normal compressibility, respiratory phasicity and response to augmentation. Calf Veins: No evidence of thrombus. Normal compressibility and flow on color Doppler imaging. Superficial Great Saphenous Vein: No evidence of thrombus. Normal compressibility. Other Findings:  None. IMPRESSION: Negative for deep venous thrombosis within both legs. Electronically Signed   By: Rogelia Myers M.D.   On: 04/15/2024 12:38   US  ABDOMEN LIMITED RUQ (LIVER/GB) Result Date: 04/15/2024 CLINICAL DATA:  [id number] RUQ abdominal pain [id number] 86105 Emesis [id number] Alkaline phosphatase elevation [id number] EXAM: ULTRASOUND ABDOMEN LIMITED RIGHT UPPER QUADRANT COMPARISON:  None Available. FINDINGS: Gallbladder: No gallstones. No wall thickening or pericholecystic fluid. No sonographic Murphy's sign noted by sonographer. Common bile duct: Diameter: 2 mm Liver: Normal echogenicity. No focal lesion identified. No intrahepatic biliary ductal dilation. Portal vein is patent on color Doppler imaging with normal direction of blood flow towards the liver. Right Kidney: Partially visualized. No mass. No hydronephrosis or nephrolithiasis. Other: None. IMPRESSION: No cholecystolithiasis or changes of acute cholecystitis. Electronically Signed   By: Rogelia Myers M.D.   On: 04/15/2024 12:36    PROCEDURES and INTERVENTIONS:  Procedures  Medications  lactated ringers  infusion (has no administration in time range)  lactated ringers  infusion 500-1,000 mL (has no administration  in time range)  lactated ringers  bolus 1,000 mL (0 mLs Intravenous Stopped 04/15/24 1246)  ondansetron  (ZOFRAN ) injection 4 mg (4 mg Intravenous Given 04/15/24 1038)  lactated ringers  bolus 500 mL (0 mLs Intravenous Stopped 04/15/24 1511)     IMPRESSION / MDM / ASSESSMENT AND PLAN / ED COURSE  I reviewed the triage vital signs and the nursing notes.  Differential diagnosis includes, but is not limited to, preeclampsia, dehydration, cyclic vomiting or cannabis hyperemesis.  UTI or sepsis  {Patient presents with symptoms of an acute illness or injury that is potentially life-threatening.  Patient presents with nausea, vomiting while [redacted] weeks pregnant.  Signs of mild dehydration, ketones in the urine.  Intact renal function and essentially normal electrolytes.  Normal lipase.  Mild leukocytosis is noted but no clear infectious etiology  of her symptoms.  Reassuring ultrasounds, as above.  Urine protein/creatinine ratio reassuring without signs of preeclampsia.  While she does have an initial hypertensive blood pressure this is in the setting of acute nausea, discomfort and cramping, resolving/normalizing with IV fluids and antiemetics.  Medication for IV cardioactive medications.  Consult with OB/GYN who received the patient in triage area for further monitoring on toco.  Clinical Course as of 04/15/24 1515  Mon Apr 15, 2024  1330 Reassessed.  Patient reports feeling better and is appreciative.  Discussed workup so far and pending evaluation by OB/GYN [DS]  [address] midwife. She would like the patient brought up to L&D triage [DS]    Clinical Course User Index [DS] Claudene Rover, MD     FINAL CLINICAL IMPRESSION(S) / ED DIAGNOSES   Final diagnoses:  Encounter for supervision of normal first pregnancy in third trimester  Obesity affecting pregnancy in second trimester, unspecified obesity type  Susceptible to varicella (non-immune), currently pregnant  Rubella non-immune status, antepartum     Rx / DC Orders   ED Discharge Orders     None        Note:  This document was prepared using Dragon voice recognition software and may include unintentional dictation errors.   Claudene Rover, MD 04/15/24 [phone]

## 2024-04-15 NOTE — ED Notes (Signed)
 Pt taken to OBS 1 by EDT [facility]

## 2024-04-15 NOTE — OB Triage Provider Note (Cosign Needed Addendum)
      L&D OB Triage Note  SUBJECTIVE Madison Boyer is a 23 y.o. G1P0000 female at [redacted]w[redacted]d, EDD Estimated Date of Delivery: 06/04/24 who presented to ED with complaints of nausea and vomiting this morning. Reports that she vomited x 2 and had a loose stool. On arrival to ED she had an elevated BP reading. Labs were completed in the ED for pre eclampsia which were negative. She denies headache, epigastric pain , visual changes. She is feeling good fetal movement.   OB History  Gravida Para Term Preterm AB Living  1 0 0 0 0 0  SAB IAB Ectopic Multiple Live Births  0 0 0 0 0    # Outcome Date GA Lbr Len/2nd Weight Sex Type Anes PTL Lv  1 Current             Medications Prior to Admission  Medication Sig Dispense Refill Last Dose/Taking   Prenatal MV-Min-FA-Omega-3 (VITAFUSION PRENATAL PO) Take by mouth.   04/14/2024 Morning   acetaminophen  (TYLENOL ) 500 MG tablet Take by mouth. (Patient not taking: Reported on 04/12/2024)      aspirin  EC 81 MG tablet Take 1 tablet (81 mg total) by mouth daily. Take after 12 weeks for prevention of preeclampssia later in pregnancy (Patient not taking: Reported on 04/12/2024) 300 tablet 2    Prenatal Vit-Fe Fumarate-FA (MULTIVITAMIN-PRENATAL) 27-0.8 MG TABS tablet Take 1 tablet by mouth daily at 12 noon. (Patient not taking: No sig reported) 30 tablet 11    progesterone  (PROMETRIUM ) 200 MG capsule Place one capsule vaginally at bedtime (Patient not taking: No sig reported) 30 capsule 6    triamcinolone  cream (KENALOG ) 0.1 % Apply 1 Application topically 2 (two) times daily as needed (eczema/rash). (Patient not taking: Reported on 04/12/2024) 80 g 3    valACYclovir  (VALTREX ) 500 MG tablet Take 1 tablet (500 mg total) by mouth 2 (two) times daily. During flare ups. (Patient not taking: Reported on 04/12/2024) 60 tablet 2      OBJECTIVE  Nursing Evaluation:   BP 119/66   Pulse 90   Temp 98.3 F (36.8 C) (Oral)   Resp 19   Ht 5' 1 (1.549 m)   LMP  08/03/2023   SpO2 100%   BMI 41.78 kg/m    Findings:   Elevated BP reading , negative for pre eclampsia.   Reactive NST, no contractions noted.       NST was performed and has been reviewed by me.  NST INTERPRETATION: Category I  Mode: External Baseline FHR 155 Moderate variability  Accelerations present Decelerations absent  Toco: ctx none  ASSESSMENT Impression:  1.  Pregnancy:  G1P0000 at [redacted]w[redacted]d , EDD Estimated Date of Delivery: 06/04/24 2.  Reassuring fetal and maternal status 3.  Possible stomach virus, nausea and vomiting resolved with medication  PLAN 1. Current condition and above findings reviewed.  Reassuring fetal and maternal condition. 2. Discharge home with standard labor precautions given to return to L&D or call the office for problems. 3. Continue routine prenatal care. Follow up in office Wednesday for BP check.   I was present and evaluated this patient in person.   Zelda Hummer, CNM

## 2024-04-16 ENCOUNTER — Ambulatory Visit: Payer: Self-pay | Admitting: Certified Nurse Midwife

## 2024-04-16 LAB — CERVICOVAGINAL ANCILLARY ONLY
Bacterial Vaginitis (gardnerella): POSITIVE — AB
Candida Glabrata: NEGATIVE
Candida Vaginitis: NEGATIVE
Chlamydia: NEGATIVE
Comment: NEGATIVE
Comment: NEGATIVE
Comment: NEGATIVE
Comment: NEGATIVE
Comment: NEGATIVE
Comment: NORMAL
Neisseria Gonorrhea: NEGATIVE
Trichomonas: NEGATIVE

## 2024-04-16 MED ORDER — METRONIDAZOLE 500 MG PO TABS: 500.0000 mg | ORAL_TABLET | Freq: Two times a day (BID) | ORAL | 0 refills | Status: AC

## 2024-04-17 ENCOUNTER — Ambulatory Visit (INDEPENDENT_AMBULATORY_CARE_PROVIDER_SITE_OTHER)

## 2024-04-17 VITALS — BP 126/82 | HR 82 | Ht 62.0 in | Wt 221.1 lb

## 2024-04-17 DIAGNOSIS — R030 Elevated blood-pressure reading, without diagnosis of hypertension: Secondary | ICD-10-CM

## 2024-04-17 NOTE — Patient Instructions (Signed)
 Postpartum Care After Vaginal Delivery The following information offers guidance about how to care for yourself from the time you deliver your baby to 6-12 weeks after delivery (postpartum period). If you have problems or questions, contact your health care provider for more specific instructions. Follow these instructions at home: Vaginal bleeding It is normal to have vaginal bleeding (lochia) after delivery. Wear a sanitary pad for bleeding and discharge. During the first week after delivery, the amount and appearance of lochia is often similar to a menstrual period. Over the next few weeks, it will gradually decrease to a dry, yellow-brown discharge. For most women, lochia stops completely by 4-6 weeks after delivery, but it can vary. Change your sanitary pads frequently. Watch for any changes in your flow, such as: An increase in bleeding. A change in color. Large blood clots. If you pass a blood clot the size of an egg or larger, contact your healthcare provider.. Do not use tampons or douches until your health care provider approves. If you are not breastfeeding, your period should return 6-8 weeks after delivery. If you are feeding your baby breast milk only, your period may not return until you stop breastfeeding. Perineal care  Keep the area between the vagina and the anus (perineum) clean and dry. Use medicated pads and pain-relieving sprays or creams as told. If you had a tear or a surgical cut in the perineum (episiotomy), check the area for signs of infection until you are healed. Check for: More redness, swelling, or pain. Pus or a bad smell. You may be given a squirt bottle to use instead of wiping to clean the perineum area after you use the bathroom. Pat the area gently to dry it. To relieve pain caused by an episiotomy, a tear, or swollen veins in the anus (hemorrhoids), take a warm sitz bath 2-4 times a day, or as many as told by your health care provider. You can use a  bathtub or a basin you put over the toilet as a sitz bath. Breast care In the first few days after delivery, your breasts may feel heavy, full, and uncomfortable (breast engorgement). Milk may also leak from your breasts. Ask your health care provider about ways to help relieve the discomfort. If you breastfeed: Wear a bra that supports your breasts and fits well. Use breast pads to absorb milk that leaks. Keep your nipples clean and dry. Apply creams and ointments as told. You may have uterine contractions every time you breastfeed for up to several weeks after delivery. This helps your uterus return to its normal size. If you have any problems with breastfeeding, tell your health care provider or lactation specialist. If you do not breastfeed: Avoid touching your breasts. Do not squeeze out (express) milk. Doing this can make your breasts produce more milk. Wear a bra that supports your breasts and fits well. Use cold packs to help with swelling. Talk to your health care provider about what over-the-counter medicines can help with pain. Intimacy and sexuality Ask your health care provider when you can engage in sexual activity. This may depend upon: Your risk of infection. How fast you are healing. Your comfort and desire to engage in sexual activity. You are able to get pregnant after delivery, even if you have not had your period. Talk with your health care provider about birth control (contraception) or family planning. Medicines Take over-the-counter and prescription medicines only as told by your health care provider. Take an over-the-counter stool softener to help  ease bowel movements as told by your health care provider. If you were prescribed antibiotics, take them as told by your health care provider. Do not stop using the antibiotic even if you start to feel better. Review all previous and current prescriptions to check for the possible transfer into your breast milk. Ask your  health care provider or lactation specialist for help if needed. Activity Gradually return to your normal activities as told by your health care provider. Rest as much as possible. Try to nap while your baby is sleeping. Eating and drinking  Drink enough fluid to keep your urine pale yellow. To help prevent or relieve constipation, eat high-fiber foods every day. Choose healthy eating to support breastfeeding and healing. Take your prenatal vitamins until your health care provider tells you to stop. General recommendations Do not use any products that contain nicotine or tobacco. These products include cigarettes, chewing tobacco, and vaping devices, such as e-cigarettes. These can delay healing. If you need help quitting, ask your health care provider. Secondhand smoke exposure is dangerous for your baby. It can increase the risk of illness and sudden infant death syndrome (SIDS). Nicotine and other chemicals pass through breast milk to the baby. Alcohol use can be dangerous during the postpartum period. Drinking alcohol may impair your judgment and ability to safely care for your baby. Not drinking alcohol is the safest option for breastfeeding mothers. Alcohol passes through breast milk to the baby. Alcohol can be damaging to your baby's development, growth, and sleep patterns. If you breastfeed and drink alcohol, wait at least 2 hours after a single drink before feeding your baby. Do not take medicines or drugs that are not prescribed to you, especially if you breastfeed. Visit your health care provider for a postpartum checkup within the first 3-6 weeks after delivery. Complete a comprehensive postpartum visit no later than 12 weeks after delivery. Keep all follow-up visits. Your health care provider will check your healing after delivery and also check your blood pressure. Where to find more information U.S. Department of Health and Cyt[employer] of Women's Health:  TravelLesson.ca [employer]necologists: acog.org Contact a health care provider if: You feel unusually sad or worried. Your breasts become red, painful, or hard. You have nausea and vomiting and are unable to eat or drink anything for 24 hours. You have a fever or other signs of infection. You have bleeding that soaks through one pad an hour or you have blood clots the size of an egg or larger. You have a severe headache that does not go away or you have a headache with vision changes. Get help right away if: You have chest pain or difficulty breathing. You have sudden, severe leg pain. You faint or have a seizure. You have thoughts about hurting yourself or your baby. You have any of the following symptoms and you were unable to reach your health care provider: A fever or other signs of infection. Bleeding that is soaking through one pad an hour or you have blood clots the size of an egg or larger. A severe headache that does not go away or you have a headache with vision changes. These symptoms may be an emergency. Get help right away. Call 911. Do not wait to see if the symptoms will go away. Do not drive yourself to the hospital. Get help if you ever feel like you may hurt yourself or others, or have thoughts about taking your own life. Go  to your nearest emergency room or: Call 911. Call the National Suicide Prevention Lifeline at 315-702-5467 or 988. This is open 24 hours a day. Text the Crisis Text Line at 507-826-2619. This information is not intended to replace advice given to you by your health care provider. Make sure you discuss any questions you have with your health care provider. Document Revised: 06/30/2022 Document Reviewed: 12/21/2021 Elsevier Patient Education  2024 [employer].

## 2024-04-17 NOTE — Progress Notes (Signed)
    NURSE VISIT NOTE  Subjective:    Patient ID: Madison Boyer, female    DOB: [birth year], 23 y.o.   MRN: [id number]  HPI  Patient is a 23 y.o. G52P0000 female who presents for BP check per order from Zelda Hummer, CNM  patient had elevated readings in L&D 04/15/24, labs for preeclampsia were negative.   BP Readings from Last 3 Encounters:  04/17/24 126/82  04/15/24 119/66  04/12/24 130/79   Pulse Readings from Last 3 Encounters:  04/17/24 82  04/15/24 90  04/12/24 93    Objective:    BP 126/82   Pulse 82   Ht 5' 2 (1.575 m)   Wt 221 lb 1.6 oz (100.3 kg)   LMP 08/03/2023   BMI 40.44 kg/m   Assessment:   1. Elevated blood pressure reading without diagnosis of hypertension      Plan:   Per Dr. Eleanor Canny, CNM:  Continue to monitor blood pressure at home. Report any reading >140/90 or with any associated symptoms. Return to clinic as scheduled.  Patient verbalized understanding of instructions.   Rollo JINNY Maxin, CMA

## 2024-04-23 ENCOUNTER — Encounter: Payer: Self-pay | Admitting: Licensed Practical Nurse

## 2024-04-23 ENCOUNTER — Ambulatory Visit (INDEPENDENT_AMBULATORY_CARE_PROVIDER_SITE_OTHER): Admitting: Licensed Practical Nurse

## 2024-04-23 VITALS — BP 152/88 | HR 80 | Wt 221.6 lb

## 2024-04-23 DIAGNOSIS — O163 Unspecified maternal hypertension, third trimester: Secondary | ICD-10-CM | POA: Diagnosis not present

## 2024-04-23 DIAGNOSIS — Z3A34 34 weeks gestation of pregnancy: Secondary | ICD-10-CM | POA: Diagnosis not present

## 2024-04-23 DIAGNOSIS — Z3403 Encounter for supervision of normal first pregnancy, third trimester: Secondary | ICD-10-CM

## 2024-04-23 DIAGNOSIS — O1413 Severe pre-eclampsia, third trimester: Secondary | ICD-10-CM | POA: Diagnosis present

## 2024-04-23 NOTE — Assessment & Plan Note (Signed)
-  Had elevated BP in the ED while she had nausea and fever PIH labs negative then, reviewed GHTN/Preeclampsia/eclampsia. Labs collected today. Reviewed she may need an early IOL for [place]

## 2024-04-23 NOTE — Progress Notes (Signed)
    Return Prenatal Note   Subjective   23 y.o. G1P0000 at [redacted]w[redacted]d presents for this follow-up prenatal visit.  Patient  Patient reports:Doing ok, admits she is stressed with worry about what it will be like once the baby is here, wonders what night time care will look like as she will be on her own, she currently lives with her mother and siblings that will be able to give some support. Is worried about fiances, has been in touch with the Child psychotherapist.  Denies Headache, visual disturbances or RUQ pain.  Reported to CNA Not taking ASA Movement: Present Contractions: Not present  Objective   Flow sheet Vitals: Pulse Rate: 80 BP: (!) 152/88 Fundal Height: 34 cm Fetal Heart Rate (bpm): 155 Total weight gain: 21 lb 9.6 oz (9.798 kg)  General Appearance  No acute distress, well appearing, and well nourished Pulmonary   Normal work of breathing Neurologic   Alert and oriented to person, place, and time Psychiatric   Mood and affect within normal limits  Assessment/Plan   Plan  23 y.o. G1P0000 at [redacted]w[redacted]d presents for follow-up OB visit. Reviewed prenatal record including previous visit note.  Elevated blood pressure affecting pregnancy in third trimester, antepartum -Had elevated BP in the ED while she had nausea and fever PIH labs negative then, reviewed GHTN/Preeclampsia/eclampsia. Labs collected today. Reviewed she may need an early IOL for GHTN    Supervision of normal pregnancy -Reading about labor, feels that she is prepared for breastfeeding.  -Her mother will be her labor support (FOB will not be present/involved) -Had baby shower, is working on getting car seat -warning signs reviewed       Orders Placed This Encounter  Procedures   Comprehensive metabolic panel with GFR   Protein / creatinine ratio, urine   CBC w/Diff/Platelet   Return in about 1 week (around 04/30/2024) for ROB.   Future Appointments  Date Time Provider Department Center  06/06/2024  1:40 PM  Liana Fish, NP [facility] None    For next visit:  continue with routine prenatal care     Cimone Fahey [facility], CNM  04/23/2509:58 AM

## 2024-04-23 NOTE — Assessment & Plan Note (Signed)
>>  ASSESSMENT AND PLAN FOR PRE-ECLAMPSIA, SEVERE, THIRD TRIMESTER WRITTEN ON 04/23/2024 10:56 AM BY DOMINIC, LYDIA MARIE, CNM  -Had elevated BP in the ED while she had nausea and fever PIH labs negative then, reviewed GHTN/Preeclampsia/eclampsia. Labs collected today. Reviewed she may need an early IOL for [facility]

## 2024-04-23 NOTE — Assessment & Plan Note (Signed)
-  Reading about labor, feels that she is prepared for breastfeeding.  -Her mother will be her labor support (FOB will not be present/involved) -Had baby shower, is working on getting car seat -warning signs reviewed

## 2024-04-24 ENCOUNTER — Telehealth: Payer: Self-pay

## 2024-04-24 LAB — CBC WITH DIFFERENTIAL/PLATELET
Basophils Absolute: 0 x10E3/uL (ref 0.0–0.2)
Basos: 1 %
EOS (ABSOLUTE): 0.1 x10E3/uL (ref 0.0–0.4)
Eos: 2 %
Hematocrit: 40.7 % (ref 34.0–46.6)
Hemoglobin: 14.2 g/dL (ref 11.1–15.9)
Immature Grans (Abs): 0.1 x10E3/uL (ref 0.0–0.1)
Immature Granulocytes: 1 %
Lymphocytes Absolute: 2 x10E3/uL (ref 0.7–3.1)
Lymphs: 27 %
MCH: 33 pg (ref 26.6–33.0)
MCHC: 34.9 g/dL (ref 31.5–35.7)
MCV: 95 fL (ref 79–97)
Monocytes Absolute: 0.5 x10E3/uL (ref 0.1–0.9)
Monocytes: 6 %
Neutrophils Absolute: 4.8 x10E3/uL (ref 1.4–7.0)
Neutrophils: 63 %
Platelets: 263 x10E3/uL (ref 150–450)
RBC: 4.3 x10E6/uL (ref 3.77–5.28)
RDW: 13.8 % (ref 11.7–15.4)
WBC: 7.4 x10E3/uL (ref 3.4–10.8)

## 2024-04-24 LAB — COMPREHENSIVE METABOLIC PANEL WITH GFR
ALT: 17 IU/L (ref 0–32)
AST: 17 IU/L (ref 0–40)
Albumin: 3.3 g/dL — ABNORMAL LOW (ref 4.0–5.0)
Alkaline Phosphatase: 227 IU/L — ABNORMAL HIGH (ref 44–121)
BUN/Creatinine Ratio: 10 (ref 9–23)
BUN: 5 mg/dL — ABNORMAL LOW (ref 6–20)
Bilirubin Total: 0.3 mg/dL (ref 0.0–1.2)
CO2: 20 mmol/L (ref 20–29)
Calcium: 8.9 mg/dL (ref 8.7–10.2)
Chloride: 104 mmol/L (ref 96–106)
Creatinine, Ser: 0.49 mg/dL — ABNORMAL LOW (ref 0.57–1.00)
Globulin, Total: 2.4 g/dL (ref 1.5–4.5)
Glucose: 70 mg/dL (ref 70–99)
Potassium: 4.9 mmol/L (ref 3.5–5.2)
Sodium: 139 mmol/L (ref 134–144)
Total Protein: 5.7 g/dL — ABNORMAL LOW (ref 6.0–8.5)
eGFR: 136 mL/min/1.73 (ref >59)

## 2024-04-24 LAB — PROTEIN / CREATININE RATIO, URINE
Creatinine, Urine: 108.7 mg/dL
Protein, Ur: 19.9 mg/dL
Protein/Creat Ratio: 183 mg/g{creat} (ref 0–200)

## 2024-04-24 NOTE — Telephone Encounter (Signed)
 Pt calling triage, threw up once after drinking water and she knows her BP was high yesterday. Denies headaches, blurred vision/spots in vision, feeling lightheaded, vag spotting/bleeding, and/or leakage of fluid. Baby movement is good. Since throwing up has not tried drinking anything else, not feeling nauseous. Around 4 am this morning she had really bad pain under breast, middle of stomach to be exact That pain is a lot less now, but still there. Advised to monitor sx for now, sending her information regarding preeclampsia and sx to watch out for. [name] will review labs and follow up with her.

## 2024-04-27 ENCOUNTER — Ambulatory Visit: Payer: Self-pay | Admitting: Licensed Practical Nurse

## 2024-04-30 ENCOUNTER — Encounter: Payer: Self-pay | Admitting: Obstetrics and Gynecology

## 2024-04-30 ENCOUNTER — Ambulatory Visit (INDEPENDENT_AMBULATORY_CARE_PROVIDER_SITE_OTHER): Admitting: Obstetrics and Gynecology

## 2024-04-30 VITALS — BP 130/85 | HR 79 | Wt 221.5 lb

## 2024-04-30 DIAGNOSIS — O163 Unspecified maternal hypertension, third trimester: Secondary | ICD-10-CM

## 2024-04-30 DIAGNOSIS — R030 Elevated blood-pressure reading, without diagnosis of hypertension: Secondary | ICD-10-CM

## 2024-04-30 DIAGNOSIS — Z3403 Encounter for supervision of normal first pregnancy, third trimester: Secondary | ICD-10-CM

## 2024-04-30 DIAGNOSIS — Z3A35 35 weeks gestation of pregnancy: Secondary | ICD-10-CM | POA: Diagnosis not present

## 2024-04-30 LAB — POCT URINALYSIS DIPSTICK OB
Bilirubin, UA: NEGATIVE
Blood, UA: NEGATIVE
Glucose, UA: NEGATIVE
Ketones, UA: NEGATIVE
Leukocytes, UA: NEGATIVE
Nitrite, UA: NEGATIVE
Spec Grav, UA: 1.015 (ref 1.010–1.025)
Urobilinogen, UA: 0.2 U/dL
pH, UA: 6 (ref 5.0–8.0)

## 2024-04-30 NOTE — Progress Notes (Signed)
ROB. She states daily fetal movement. She states no questions or concerns at this time.

## 2024-04-30 NOTE — Progress Notes (Signed)
 ROB: Denies problems.  Reports daily fetal movement.  She is being followed for borderline hypertension.  Not yet on medication.  Preeclamptic labs last week were normal.  Recommend repeat preeclamptic labs next week.  Cultures next week.  If hypertension worsening consider antihypertensive medication and begin NSTs.  The possibility of early induction for gestational hypertension discussed. Also being followed for a bicornuate/septate uterus.  We have discussed the possibility of dysfunctional labor.

## 2024-05-06 ENCOUNTER — Encounter: Payer: Self-pay | Admitting: Certified Nurse Midwife

## 2024-05-07 ENCOUNTER — Other Ambulatory Visit: Payer: Self-pay

## 2024-05-07 ENCOUNTER — Encounter: Payer: Self-pay | Admitting: Obstetrics and Gynecology

## 2024-05-07 ENCOUNTER — Inpatient Hospital Stay
Admission: EM | Admit: 2024-05-07 | Discharge: 2024-05-11 | DRG: 788 | Disposition: A | Discharge status: Home / Self Care | Attending: Certified Nurse Midwife | Admitting: Certified Nurse Midwife

## 2024-05-07 ENCOUNTER — Other Ambulatory Visit (HOSPITAL_COMMUNITY)
Admission: RE | Admit: 2024-05-07 | Discharge: 2024-05-07 | Disposition: A | Discharge status: Home / Self Care | Source: Ambulatory Visit | Attending: Obstetrics & Gynecology | Admitting: Obstetrics & Gynecology

## 2024-05-07 ENCOUNTER — Ambulatory Visit (INDEPENDENT_AMBULATORY_CARE_PROVIDER_SITE_OTHER): Admitting: Obstetrics & Gynecology

## 2024-05-07 VITALS — BP 144/90 | HR 83 | Wt 226.0 lb

## 2024-05-07 DIAGNOSIS — Z2839 Other underimmunization status: Secondary | ICD-10-CM

## 2024-05-07 DIAGNOSIS — Z3403 Encounter for supervision of normal first pregnancy, third trimester: Principal | ICD-10-CM

## 2024-05-07 DIAGNOSIS — Z3685 Encounter for antenatal screening for Streptococcus B: Secondary | ICD-10-CM | POA: Diagnosis not present

## 2024-05-07 DIAGNOSIS — Q513 Bicornate uterus: Secondary | ICD-10-CM

## 2024-05-07 DIAGNOSIS — O1414 Severe pre-eclampsia complicating childbirth: Principal | ICD-10-CM | POA: Diagnosis present

## 2024-05-07 DIAGNOSIS — O3403 Maternal care for unspecified congenital malformation of uterus, third trimester: Secondary | ICD-10-CM | POA: Diagnosis present

## 2024-05-07 DIAGNOSIS — Z3A36 36 weeks gestation of pregnancy: Secondary | ICD-10-CM | POA: Diagnosis not present

## 2024-05-07 DIAGNOSIS — O99214 Obesity complicating childbirth: Secondary | ICD-10-CM | POA: Diagnosis present

## 2024-05-07 DIAGNOSIS — Z87891 Personal history of nicotine dependence: Secondary | ICD-10-CM

## 2024-05-07 DIAGNOSIS — O99212 Obesity complicating pregnancy, second trimester: Secondary | ICD-10-CM

## 2024-05-07 DIAGNOSIS — Z833 Family history of diabetes mellitus: Secondary | ICD-10-CM

## 2024-05-07 DIAGNOSIS — F1291 Cannabis use, unspecified, in remission: Secondary | ICD-10-CM | POA: Diagnosis not present

## 2024-05-07 DIAGNOSIS — O163 Unspecified maternal hypertension, third trimester: Secondary | ICD-10-CM

## 2024-05-07 DIAGNOSIS — Z113 Encounter for screening for infections with a predominantly sexual mode of transmission: Secondary | ICD-10-CM | POA: Insufficient documentation

## 2024-05-07 DIAGNOSIS — O324XX0 Maternal care for high head at term, not applicable or unspecified: Secondary | ICD-10-CM | POA: Diagnosis present

## 2024-05-07 DIAGNOSIS — O09899 Supervision of other high risk pregnancies, unspecified trimester: Secondary | ICD-10-CM

## 2024-05-07 DIAGNOSIS — O1413 Severe pre-eclampsia, third trimester: Secondary | ICD-10-CM | POA: Diagnosis present

## 2024-05-07 DIAGNOSIS — R1011 Right upper quadrant pain: Secondary | ICD-10-CM | POA: Diagnosis present

## 2024-05-07 DIAGNOSIS — O99324 Drug use complicating childbirth: Secondary | ICD-10-CM | POA: Diagnosis not present

## 2024-05-07 LAB — COMPREHENSIVE METABOLIC PANEL WITH GFR
ALT: 13 U/L (ref 0–44)
AST: 21 U/L (ref 15–41)
Albumin: 2.7 g/dL — ABNORMAL LOW (ref 3.5–5.0)
Alkaline Phosphatase: 194 U/L — ABNORMAL HIGH (ref 38–126)
Anion gap: 11 (ref 5–15)
BUN: 10 mg/dL (ref 6–20)
CO2: 19 mmol/L — ABNORMAL LOW (ref 22–32)
Calcium: 8.8 mg/dL — ABNORMAL LOW (ref 8.9–10.3)
Chloride: 108 mmol/L (ref 98–111)
Creatinine, Ser: 0.36 mg/dL — ABNORMAL LOW (ref 0.44–1.00)
GFR, Estimated: >60 mL/min (ref >60)
Glucose, Bld: 81 mg/dL (ref 70–99)
Potassium: 4.1 mmol/L (ref 3.5–5.1)
Sodium: 138 mmol/L (ref 135–145)
Total Bilirubin: 0.5 mg/dL (ref 0.0–1.2)
Total Protein: 6.1 g/dL — ABNORMAL LOW (ref 6.5–8.1)

## 2024-05-07 LAB — TYPE AND SCREEN
ABO/RH(D): O POS
Antibody Screen: NEGATIVE

## 2024-05-07 LAB — URINE DRUG SCREEN, QUALITATIVE (ARMC ONLY)
Amphetamines, Ur Screen: NOT DETECTED
Barbiturates, Ur Screen: NOT DETECTED
Benzodiazepine, Ur Scrn: NOT DETECTED
Cannabinoid 50 Ng, Ur ~~LOC~~: NOT DETECTED
Cocaine Metabolite,Ur ~~LOC~~: NOT DETECTED
MDMA (Ecstasy)Ur Screen: NOT DETECTED
Methadone Scn, Ur: NOT DETECTED
Opiate, Ur Screen: NOT DETECTED
Phencyclidine (PCP) Ur S: NOT DETECTED
Tricyclic, Ur Screen: NOT DETECTED

## 2024-05-07 LAB — URINALYSIS, ROUTINE W REFLEX MICROSCOPIC
Bilirubin Urine: NEGATIVE
Glucose, UA: NEGATIVE mg/dL
Hgb urine dipstick: NEGATIVE
Ketones, ur: NEGATIVE mg/dL
Nitrite: NEGATIVE
Protein, ur: 100 mg/dL — AB
Specific Gravity, Urine: 1.006 (ref 1.005–1.030)
pH: 7 (ref 5.0–8.0)

## 2024-05-07 LAB — CBC
HCT: 37.2 % (ref 36.0–46.0)
Hemoglobin: 13 g/dL (ref 12.0–15.0)
MCH: 30.5 pg (ref 26.0–34.0)
MCHC: 34.9 g/dL (ref 30.0–36.0)
MCV: 87.3 fL (ref 80.0–100.0)
Platelets: 228 K/uL (ref 150–400)
RBC: 4.26 MIL/uL (ref 3.87–5.11)
RDW: 13.2 % (ref 11.5–15.5)
WBC: 10.7 K/uL — ABNORMAL HIGH (ref 4.0–10.5)
nRBC: 0 % (ref 0.0–0.2)

## 2024-05-07 LAB — AMYLASE: Amylase: 58 U/L (ref 28–100)

## 2024-05-07 LAB — ABO/RH: ABO/RH(D): O POS

## 2024-05-07 LAB — PROTEIN / CREATININE RATIO, URINE
Creatinine, Urine: 34 mg/dL
Protein Creatinine Ratio: 1.44 mg/mg{creat} — ABNORMAL HIGH (ref 0.00–0.15)
Total Protein, Urine: 49 mg/dL

## 2024-05-07 LAB — LIPASE, BLOOD: Lipase: 28 U/L (ref 11–51)

## 2024-05-07 MED ORDER — CALCIUM GLUCONATE 10 % IV SOLN
INTRAVENOUS | Status: DC
Start: 2024-05-07 — End: 2024-05-08
  Filled 2024-05-07: qty 10

## 2024-05-07 MED ORDER — OXYTOCIN-SODIUM CHLORIDE 30-0.9 UT/500ML-% IV SOLN
1.0000 m[IU]/min | INTRAVENOUS | Status: DC
  Administered 2024-05-08: 2 m[IU]/min via INTRAVENOUS
  Administered 2024-05-08: 24 m[IU]/min via INTRAVENOUS
  Administered 2024-05-08: 26 m[IU]/min via INTRAVENOUS
  Administered 2024-05-08: 22 m[IU]/min via INTRAVENOUS
  Administered 2024-05-08: 20 m[IU]/min via INTRAVENOUS
  Administered 2024-05-08: 28 m[IU]/min via INTRAVENOUS
  Filled 2024-05-07: qty 500

## 2024-05-07 MED ORDER — LACTATED RINGERS IV SOLN: INTRAVENOUS | Status: AC

## 2024-05-07 MED ORDER — SODIUM CHLORIDE 0.9 % IV SOLN
5.0000 10*6.[IU] | Freq: Once | INTRAVENOUS | Status: AC
  Administered 2024-05-08: 5 10*6.[IU] via INTRAVENOUS
  Filled 2024-05-07: qty 5

## 2024-05-07 MED ORDER — MISOPROSTOL 50MCG HALF TABLET
50.0000 ug | ORAL_TABLET | Freq: Once | ORAL | Status: AC
  Filled 2024-05-07: qty 1

## 2024-05-07 MED ORDER — MAGNESIUM SULFATE 40 GM/1000ML IV SOLN
2.0000 g/h | INTRAVENOUS | Status: DC
  Administered 2024-05-07 – 2024-05-08 (×3): 2 g/h via INTRAVENOUS
  Filled 2024-05-07 (×2): qty 1000

## 2024-05-07 MED ORDER — MAGNESIUM SULFATE BOLUS VIA INFUSION
4.0000 g | Freq: Once | INTRAVENOUS | Status: AC
  Administered 2024-05-07: 4 g via INTRAVENOUS
  Filled 2024-05-07: qty 1000

## 2024-05-07 MED ORDER — LACTATED RINGERS IV SOLN: 500.0000 mL | INTRAVENOUS | Status: AC | PRN

## 2024-05-07 MED ORDER — AMMONIA AROMATIC IN INHA
RESPIRATORY_TRACT | Status: AC
  Filled 2024-05-07: qty 10

## 2024-05-07 MED ORDER — MISOPROSTOL 25 MCG QUARTER TABLET
ORAL_TABLET | ORAL | Status: AC
  Filled 2024-05-07: qty 1

## 2024-05-07 MED ORDER — LABETALOL HCL 5 MG/ML IV SOLN: 80.0000 mg | INTRAVENOUS | Status: DC | PRN

## 2024-05-07 MED ORDER — OXYTOCIN-SODIUM CHLORIDE 30-0.9 UT/500ML-% IV SOLN
2.5000 [IU]/h | INTRAVENOUS | Status: DC
  Administered 2024-05-08: 2.5 [IU]/h via INTRAVENOUS
  Filled 2024-05-07: qty 500

## 2024-05-07 MED ORDER — OXYTOCIN BOLUS FROM INFUSION
333.0000 mL | Freq: Once | INTRAVENOUS | Status: AC
  Administered 2024-05-08: 300 mL via INTRAVENOUS

## 2024-05-07 MED ORDER — MISOPROSTOL 50MCG HALF TABLET
ORAL_TABLET | ORAL | Status: AC
  Administered 2024-05-07: 50 ug via VAGINAL
  Filled 2024-05-07: qty 1

## 2024-05-07 MED ORDER — LABETALOL HCL 5 MG/ML IV SOLN: 20.0000 mg | INTRAVENOUS | Status: DC | PRN

## 2024-05-07 MED ORDER — LIDOCAINE HCL (PF) 1 % IJ SOLN
30.0000 mL | INTRAMUSCULAR | Status: DC | PRN
  Filled 2024-05-07: qty 30

## 2024-05-07 MED ORDER — ACETAMINOPHEN 500 MG PO TABS
1000.0000 mg | ORAL_TABLET | Freq: Four times a day (QID) | ORAL | Status: DC | PRN
  Administered 2024-05-08: 1000 mg via ORAL
  Filled 2024-05-07: qty 2

## 2024-05-07 MED ORDER — LACTATED RINGERS IV SOLN
500.0000 mL | INTRAVENOUS | Status: DC | PRN
  Administered 2024-05-08 (×2): 500 mL via INTRAVENOUS

## 2024-05-07 MED ORDER — HYDROXYZINE HCL 25 MG PO TABS: 50.0000 mg | ORAL_TABLET | Freq: Four times a day (QID) | ORAL | Status: DC | PRN

## 2024-05-07 MED ORDER — HYDRALAZINE HCL 20 MG/ML IJ SOLN: 10.0000 mg | INTRAMUSCULAR | Status: DC | PRN

## 2024-05-07 MED ORDER — LABETALOL HCL 5 MG/ML IV SOLN: 40.0000 mg | INTRAVENOUS | Status: DC | PRN

## 2024-05-07 MED ORDER — OXYTOCIN 10 UNIT/ML IJ SOLN
INTRAMUSCULAR | Status: DC
Start: 2024-05-07 — End: 2024-05-08
  Filled 2024-05-07: qty 2

## 2024-05-07 MED ORDER — FENTANYL CITRATE (PF) 100 MCG/2ML IJ SOLN
50.0000 ug | INTRAMUSCULAR | Status: DC | PRN
  Administered 2024-05-08: 50 ug via INTRAVENOUS
  Administered 2024-05-08: 100 ug via INTRAVENOUS
  Filled 2024-05-07 (×2): qty 2

## 2024-05-07 MED ORDER — MISOPROSTOL 50MCG HALF TABLET
ORAL_TABLET | ORAL | Status: AC
  Administered 2024-05-08: 50 ug via VAGINAL
  Filled 2024-05-07: qty 1

## 2024-05-07 MED ORDER — ONDANSETRON HCL 4 MG/2ML IJ SOLN
4.0000 mg | Freq: Four times a day (QID) | INTRAMUSCULAR | Status: DC | PRN
  Administered 2024-05-08: 4 mg via INTRAVENOUS
  Filled 2024-05-07: qty 2

## 2024-05-07 MED ORDER — SOD CITRATE-CITRIC ACID 500-334 MG/5ML PO SOLN
30.0000 mL | ORAL | Status: DC | PRN
  Administered 2024-05-08: 30 mL via ORAL

## 2024-05-07 MED ORDER — TERBUTALINE SULFATE 1 MG/ML IJ SOLN: 0.2500 mg | Freq: Once | INTRAMUSCULAR | Status: DC | PRN

## 2024-05-07 MED ORDER — MISOPROSTOL 25 MCG QUARTER TABLET
25.0000 ug | ORAL_TABLET | Freq: Once | ORAL | Status: AC
  Administered 2024-05-07: 25 ug via ORAL
  Filled 2024-05-07: qty 1

## 2024-05-07 MED ORDER — PENICILLIN G POT IN DEXTROSE 60000 UNIT/ML IV SOLN
3.0000 10*6.[IU] | INTRAVENOUS | Status: DC
  Administered 2024-05-08 (×2): 3 10*6.[IU] via INTRAVENOUS
  Filled 2024-05-07 (×2): qty 50

## 2024-05-07 MED ORDER — MISOPROSTOL 200 MCG PO TABS
ORAL_TABLET | ORAL | Status: AC
  Administered 2024-05-07: 50 ug via VAGINAL
  Filled 2024-05-07: qty 4

## 2024-05-07 NOTE — Progress Notes (Signed)
 Patient ID: Madison Boyer, female   DOB: 2001-09-11, 23 y.o.   MRN: [id number] Case discussed with CNM Jayne . Agree with induction base on severe RUQ pain and proteinuria . Criteria for preeclampsia with severe features . Magnesium  sulfate started and induction .

## 2024-05-07 NOTE — H&P (Signed)
 [facility] Labor & Delivery  History and Physical   HPI   Chief Complaint: abdominal pain  Madison Boyer is a 23 y.o. G1P0000 at [redacted]w[redacted]d who presents for right upper quadrant pain & elevated blood pressure in clinic. She was seen for her routine visit, reported right sided pain since Friday & increased swelling to feet & face. Her first elevated blood pressure was 7/14 at 33w.  Her abdominal pain is constant, sometimes worsens, denies alleviating factors, reports when it is worse she lays still manage the pain.    Patient reports normal fetal movement, denies loss of fluid or contractions. Denies headache or visual changes.  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   HISTORY   Medications Medications Prior to Admission  Medication Sig Dispense Refill Last Dose/Taking   Prenatal MV-Min-FA-Omega-3 (VITAFUSION PRENATAL PO) Take by mouth.   05/07/2024   ondansetron  (ZOFRAN -ODT) 4 MG disintegrating tablet Take 1 tablet (4 mg total) by mouth every 8 (eight) hours as needed for nausea or vomiting. 20 tablet 0    valACYclovir  (VALTREX ) 500 MG tablet Take 1 tablet (500 mg total) by mouth 2 (two) times daily. During flare ups. (Patient not taking: Reported on 04/12/2024) 60 tablet 2     Allergies has no known allergies.   OB History OB  History  Gravida Para Term Preterm AB Living  1 0 0 0 0 0  SAB IAB Ectopic Multiple Live Births  0 0 0 0 0    # Outcome Date GA Lbr Len/2nd Weight Sex Type Anes PTL Lv  1 Current             Past Medical History Past Medical History:  Diagnosis Date   Bipolar depression (HCC)    Schizophrenic disorder (HCC) 2021    Past Surgical History Past Surgical History:  Procedure Laterality Date   APPENDECTOMY      Social History  reports that she has quit smoking. Her smoking use included e-cigarettes. She has never used smokeless tobacco. She reports that she does not currently use alcohol. She reports that she does not currently use drugs after having used the following drugs: Marijuana.   Family History family history includes Depression in her maternal grandfather; Diabetes in her maternal grandfather; Healthy in her father, maternal grandmother, mother, paternal grandfather, and paternal grandmother.   PHYSICAL EXAM   Vitals:   05/07/24 1400 05/07/24 1425 05/07/24 1443 05/07/24 1514  BP: (!) 155/112 (!) 152/93 (!) 151/88 (!) 144/92  Pulse: 84 76 73 70  Resp:  14    Temp:  98.2 F (36.8 C)    TempSrc:  Oral    SpO2: 98%     Weight:  102.1 kg    Height:  5' 2 (1.575 m)      Constitutional: No acute distress, well appearing, and well nourished. Neurologic: She is alert and conversational.  Psychiatric: She has a normal mood  and affect.  Musculoskeletal: Normal gait, grossly normal range of motion Cardiovascular: Normal rate.   Pulmonary/Chest: Normal work of breathing.  Gastrointestinal/Abdominal: Soft. Gravid. There is no tenderness.  Skin: Skin is warm and dry. No rash noted.  Genitourinary: Normal external female genitalia.  SVE:   Dilation: 1 Effacement (%): 50 Cervical Position: Posterior Station: -2 Presentation: Vertex (confirmed on BSUS) Exam by:: DOROTHA Cisco, CNM   NST Interpretation Indication: hypertension  Baseline: 140 bpm Variability:  moderate Accelerations: present Decelerations: absent Contractions: occasional, not felt by patient Time noted:  See OBIX Impression: reactive Authenticated by: Harlene LITTIE Cisco    PRENATAL LABS FROM OB RESULTS CONSOLE  ABO, Rh: --/--/O POS (08/05 1448) Antibody: NEG (08/05 1448) Rubella: <0.90 (03/03 1029) Varicella: Non Reactive (03/03 1029) RPR: Non Reactive (06/04 1035)  HBsAg: Negative (03/03 1029)  HC No results found for: HCVAB HIV: Non Reactive (06/04 1035)  GC:  collected; negative 7/11 CT:  collected, negative 7/11 1h GTT:  Lab Results  Component Value Date   GLUCOSE 81 05/07/2024   GBS:  unknown, collected today  ENCOUNTER LABS    Results for orders placed or performed during the hospital encounter of 05/07/24 (from the past 24 hours)  CBC     Status: Abnormal   Collection Time: 05/07/24  2:23 PM  Result Value Ref Range   WBC 10.7 (H) 4.0 - 10.5 K/uL   RBC 4.26 3.87 - 5.11 MIL/uL   Hemoglobin 13.0 12.0 - 15.0 g/dL   HCT 62.7 63.9 - 53.9 %   MCV 87.3 80.0 - 100.0 fL   MCH 30.5 26.0 - 34.0 pg   MCHC 34.9 30.0 - 36.0 g/dL   RDW 86.7 88.4 - 84.4 %   Platelets 228 150 - 400 K/uL   nRBC 0.0 0.0 - 0.2 %  Comprehensive metabolic panel     Status: Abnormal   Collection Time: 05/07/24  2:23 PM  Result Value Ref Range   Sodium 138 135 - 145 mmol/L   Potassium 4.1 3.5 - 5.1 mmol/L   Chloride 108 98 - 111 mmol/L   CO2 19 (L) 22 - 32 mmol/L   Glucose, Bld 81 70 - 99 mg/dL   BUN 10 6 - 20 mg/dL   Creatinine, Ser 9.63 (L) 0.44 - 1.00 mg/dL   Calcium  8.8 (L) 8.9 - 10.3 mg/dL   Total Protein 6.1 (L) 6.5 - 8.1 g/dL   Albumin 2.7 (L) 3.5 - 5.0 g/dL   AST 21 15 - 41 U/L   ALT 13 0 - 44 U/L   Alkaline Phosphatase 194 (H) 38 - 126 U/L   Total Bilirubin 0.5 0.0 - 1.2 mg/dL   GFR, Estimated >39 >39 mL/min   Anion gap 11 5 - 15  Protein / creatinine ratio, urine     Status: Abnormal   Collection Time: 05/07/24  2:23 PM  Result Value Ref Range   Creatinine, Urine  34 mg/dL   Total Protein, Urine 49 mg/dL   Protein Creatinine Ratio 1.44 (H) 0.00 - 0.15 mg/mg[Cre]  Urinalysis, Routine w reflex microscopic -Urine, Clean Catch     Status: Abnormal   Collection Time: 05/07/24  2:23 PM  Result Value Ref Range   Color, Urine STRAW (A) YELLOW   APPearance CLEAR (A) CLEAR   Specific Gravity, Urine 1.006 1.005 - 1.030   pH 7.0 5.0 - 8.0   Glucose, UA NEGATIVE NEGATIVE mg/dL   Hgb urine dipstick NEGATIVE NEGATIVE   Bilirubin Urine NEGATIVE NEGATIVE  Ketones, ur NEGATIVE NEGATIVE mg/dL   Protein, ur 899 (A) NEGATIVE mg/dL   Nitrite NEGATIVE NEGATIVE   Leukocytes,Ua TRACE (A) NEGATIVE   RBC / HPF 0-5 0 - 5 RBC/hpf   WBC, UA 0-5 0 - 5 WBC/hpf   Bacteria, UA RARE (A) NONE SEEN   Squamous Epithelial / HPF 0-5 0 - 5 /HPF   Mucus PRESENT   Amylase     Status: None   Collection Time: 05/07/24  2:23 PM  Result Value Ref Range   Amylase 58 28 - 100 U/L  Lipase, blood     Status: None   Collection Time: 05/07/24  2:23 PM  Result Value Ref Range   Lipase 28 11 - 51 U/L  ABO/Rh     Status: None   Collection Time: 05/07/24  2:23 PM  Result Value Ref Range   ABO/RH(D)      O POS Performed at [facility], [address] [place], [place] [address]   Type and screen [facility]     Status: None   Collection Time: 05/07/24  2:48 PM  Result Value Ref Range   ABO/RH(D) O POS    Antibody Screen NEG    Sample Expiration      05/10/2024,2359 Performed at [facility], [address], [place], [place] [address]     ASSESSMENT AND PLAN   Madison Boyer is a 23 y.o. G1P0000 at [redacted]w[redacted]d with EDD: 06/04/2024, by Ultrasound admitted for pre-eclampsia with severe features.   Induction indication: pre-eclampsia with severe features  Misoprostol  for ripening, Cook catheter when able to place. Pitocin /AROM when indicated  GBS unknown - treat with penicillin   Pre-eclampsia with severe features - Magnesium  sulfate  for seizure prophylaxis  Fetal Status: - cephalic presentation by bedside ultrasound - EFW: 1761 gm 3 lb 14 oz 19 % 7/09 by MFM ultrasound - 5# by Leopolds - CEFM - FHR currently category 1  Pain management: - Plans epidural  Hx schizoaffective disorder - no medications, monitor for PP depression/anxiety  Bicornuate vs septate uterus - Placenta posterior - Normal growth   Hx Marijuana use - UDS on admit - Notify pediatrics team  Labs/Immunizations: TDAP: Given prenatally. Flu: n/a RSV: No Rubella: Not immune Varicella: Not immune    Postpartum Plan: - Feeding: Breast Milk and Formula - Contraception: plans undecided - Prenatal Care Provider: [facility]  Attending Dr. Lovetta was immediately available for the care of the patient.

## 2024-05-07 NOTE — OB Triage Note (Signed)
 Patient is a 23 yo, G1P0, at 36 weeks 0 days. Patient presents with complaints of elevated BP. Patient was sent over from the [facility] office for a pre-e workup. Patient denies any headache or vision changes but reports RUQ pain that started on Friday. Patient denies any vaginal bleeding or LOF. Patient denies any regular or consistent contractions. Patient reports +FM. Monitors applied and assessing. VSS. Initial fetal heart tone 135. Jayne CNM notified of patients arrival to unit. Plan to place in observation for NST and PIH eval.

## 2024-05-07 NOTE — Plan of Care (Signed)
  Problem: Education: Goal: Knowledge of General Education information will improve Description: Including pain rating scale, medication(s)/side effects and non-pharmacologic comfort measures Outcome: Progressing   Problem: Health Behavior/Discharge Planning: Goal: Ability to manage health-related needs will improve Outcome: Progressing   Problem: Clinical Measurements: Goal: Ability to maintain clinical measurements within normal limits will improve Outcome: Progressing Goal: Will remain free from infection Outcome: Progressing Goal: Diagnostic test results will improve Outcome: Progressing Goal: Respiratory complications will improve Outcome: Progressing Goal: Cardiovascular complication will be avoided Outcome: Progressing   Problem: Activity: Goal: Risk for activity intolerance will decrease Outcome: Progressing   Problem: Nutrition: Goal: Adequate nutrition will be maintained Outcome: Progressing   Problem: Coping: Goal: Level of anxiety will decrease Outcome: Progressing   Problem: Elimination: Goal: Will not experience complications related to bowel motility Outcome: Progressing Goal: Will not experience complications related to urinary retention Outcome: Progressing   Problem: Pain Managment: Goal: General experience of comfort will improve and/or be controlled Outcome: Progressing   Problem: Safety: Goal: Ability to remain free from injury will improve Outcome: Progressing   Problem: Skin Integrity: Goal: Risk for impaired skin integrity will decrease Outcome: Progressing   Problem: Education: Goal: Knowledge of disease or condition will improve Outcome: Progressing Goal: Knowledge of the prescribed therapeutic regimen will improve Outcome: Progressing   Problem: Fluid Volume: Goal: Peripheral tissue perfusion will improve Outcome: Progressing   Problem: Clinical Measurements: Goal: Complications related to disease process, condition or treatment  will be avoided or minimized Outcome: Progressing   Problem: Education: Goal: Knowledge of Childbirth will improve Outcome: Progressing Goal: Ability to make informed decisions regarding treatment and plan of care will improve Outcome: Progressing Goal: Ability to state and carry out methods to decrease the pain will improve Outcome: Progressing Goal: Individualized Educational Video(s) Outcome: Progressing   Problem: Coping: Goal: Ability to verbalize concerns and feelings about labor and delivery will improve Outcome: Progressing   Problem: Life Cycle: Goal: Ability to make normal progression through stages of labor will improve Outcome: Progressing Goal: Ability to effectively push during vaginal delivery will improve Outcome: Progressing   Problem: Role Relationship: Goal: Will demonstrate positive interactions with the child Outcome: Progressing   Problem: Safety: Goal: Risk of complications during labor and delivery will decrease Outcome: Progressing   Problem: Pain Management: Goal: Relief or control of pain from uterine contractions will improve Outcome: Progressing

## 2024-05-07 NOTE — Progress Notes (Signed)
   PRENATAL VISIT NOTE  Subjective:  Madison Boyer is a 23 y.o. G1P0000 at [redacted]w[redacted]d being seen today for ongoing prenatal care.  She is currently monitored for the following issues for this high-risk pregnancy and has Schizoaffective disorder (HCC); Cannabis abuse; Overdose of antidepressant; Supervision of normal pregnancy; Bicornuate uterus; Obesity affecting pregnancy; Susceptible to varicella (non-immune), currently pregnant; Rubella non-immune status, antepartum; Short cervix affecting pregnancy; Labor and delivery, indication for care; and Elevated blood pressure affecting pregnancy in third trimester, antepartum on their problem list.  Patient reports RUQ pain for 4 days. She denies headache or visual changes. She has noted puffiness of her face and pedal edema. She has had 4 out of 5 last ROB visits with elevated BPs. She has had negative labs for pre E at her last visit.  Contractions: Not present. Vag. Bleeding: None.  Movement: Present. Denies leaking of fluid.   The following portions of the patient's history were reviewed and updated as appropriate: allergies, current medications, past family history, past medical history, past social history, past surgical history and problem list.   Objective:    Vitals:   05/07/24 1338  BP: (!) 144/90  Pulse: 83  Weight: 226 lb (102.5 kg)  4+ pedal edema 2+ DTRs bilaterally Facial edema noted  Fetal Status:  Fetal Heart Rate (bpm): 134 Fundal Height: 34 cm Movement: Present Presentation: Vertex  General: Alert, oriented and cooperative. Patient is in no acute distress.  Skin: Skin is warm and dry. No rash noted.   Cardiovascular: Normal heart rate noted  Respiratory: Normal respiratory effort, no problems with respiration noted  Abdomen: Soft, gravid, appropriate for gestational age.  Pain/Pressure: Present     Pelvic: Cervical exam performed in the presence of a chaperone Dilation: Closed Effacement (%): Thick Station: Ballotable   Extremities: Normal range of motion.     Mental Status: Normal mood and affect. Normal behavior. Normal judgment and thought content.   Assessment and Plan:  Pregnancy: G1P0000 at [redacted]w[redacted]d 1. Encounter for supervision of normal first pregnancy in third trimester (Primary)  - Culture, beta strep (group b only) - Cervicovaginal ancillary only  2. [redacted] weeks gestation of pregnancy  - Culture, beta strep (group b only) - Cervicovaginal ancillary only  3. Screening, antenatal, for Streptococcus B  - Culture, beta strep (group b only)  4. Screen for STD (sexually transmitted disease)  - Cervicovaginal ancillary only 5.  Elevated BPs and RUQ pain- she will go to the hospital for labs and fetal monitoring. I spoke with the midwife on call.   Preterm labor symptoms and general obstetric precautions including but not limited to vaginal bleeding, contractions, leaking of fluid and fetal movement were reviewed in detail with the patient. Please refer to After Visit Summary for other counseling recommendations.   No follow-ups on file.  Future Appointments  Date Time Provider Department Center  05/07/2024  4:15 PM Starla Harland BROCKS, MD [facility] None  06/06/2024  1:40 PM Liana Fish, NP [facility] None    Harland BROCKS Starla, MD

## 2024-05-08 ENCOUNTER — Encounter: Payer: Self-pay | Admitting: Obstetrics and Gynecology

## 2024-05-08 ENCOUNTER — Other Ambulatory Visit: Payer: Self-pay

## 2024-05-08 ENCOUNTER — Encounter
Admission: EM | Disposition: A | Discharge status: Home / Self Care | Payer: Self-pay | Source: Home / Self Care | Attending: Certified Nurse Midwife

## 2024-05-08 ENCOUNTER — Inpatient Hospital Stay: Admitting: Anesthesiology

## 2024-05-08 DIAGNOSIS — O1414 Severe pre-eclampsia complicating childbirth: Principal | ICD-10-CM

## 2024-05-08 DIAGNOSIS — O99324 Drug use complicating childbirth: Secondary | ICD-10-CM

## 2024-05-08 DIAGNOSIS — Q513 Bicornate uterus: Secondary | ICD-10-CM

## 2024-05-08 DIAGNOSIS — O3403 Maternal care for unspecified congenital malformation of uterus, third trimester: Secondary | ICD-10-CM

## 2024-05-08 DIAGNOSIS — Z3A36 36 weeks gestation of pregnancy: Secondary | ICD-10-CM

## 2024-05-08 DIAGNOSIS — F1291 Cannabis use, unspecified, in remission: Secondary | ICD-10-CM

## 2024-05-08 LAB — CBC
HCT: 40 % (ref 36.0–46.0)
Hemoglobin: 14.3 g/dL (ref 12.0–15.0)
MCH: 31.2 pg (ref 26.0–34.0)
MCHC: 35.8 g/dL (ref 30.0–36.0)
MCV: 87.1 fL (ref 80.0–100.0)
Platelets: 230 K/uL (ref 150–400)
RBC: 4.59 MIL/uL (ref 3.87–5.11)
RDW: 13.5 % (ref 11.5–15.5)
WBC: 13.4 K/uL — ABNORMAL HIGH (ref 4.0–10.5)
nRBC: 0 % (ref 0.0–0.2)

## 2024-05-08 LAB — RPR: RPR Ser Ql: NONREACTIVE

## 2024-05-08 SURGERY — Surgical Case
Anesthesia: Epidural

## 2024-05-08 MED ORDER — DEXAMETHASONE SODIUM PHOSPHATE 10 MG/ML IJ SOLN
INTRAMUSCULAR | Status: DC | PRN
  Administered 2024-05-08: 10 mg via INTRAVENOUS

## 2024-05-08 MED ORDER — METHYLERGONOVINE MALEATE 0.2 MG/ML IJ SOLN
INTRAMUSCULAR | Status: AC
  Filled 2024-05-08: qty 1

## 2024-05-08 MED ORDER — CEFAZOLIN SODIUM-DEXTROSE 2-4 GM/100ML-% IV SOLN
INTRAVENOUS | Status: AC
  Filled 2024-05-08: qty 100

## 2024-05-08 MED ORDER — LIDOCAINE 5 % EX PTCH
MEDICATED_PATCH | CUTANEOUS | Status: AC
  Administered 2024-05-08: 1
  Filled 2024-05-08: qty 1

## 2024-05-08 MED ORDER — LIDOCAINE-EPINEPHRINE (PF) 1.5 %-1:200000 IJ SOLN
INTRAMUSCULAR | Status: DC | PRN
  Administered 2024-05-08: 3 mL via PERINEURAL

## 2024-05-08 MED ORDER — KETAMINE HCL 50 MG/5ML IJ SOSY
PREFILLED_SYRINGE | INTRAMUSCULAR | Status: DC | PRN
  Administered 2024-05-08: 20 mg via INTRAVENOUS

## 2024-05-08 MED ORDER — MISOPROSTOL 50MCG HALF TABLET: 50.0000 ug | ORAL_TABLET | ORAL | Status: DC

## 2024-05-08 MED ORDER — EPHEDRINE 5 MG/ML INJ: 10.0000 mg | INTRAVENOUS | Status: DC | PRN

## 2024-05-08 MED ORDER — MORPHINE SULFATE (PF) 0.5 MG/ML IJ SOLN
INTRAMUSCULAR | Status: AC
Start: 2024-05-08 — End: 2024-05-08
  Filled 2024-05-08: qty 10

## 2024-05-08 MED ORDER — PHENYLEPHRINE 80 MCG/ML (10ML) SYRINGE FOR IV PUSH (FOR BLOOD PRESSURE SUPPORT): 80.0000 ug | PREFILLED_SYRINGE | INTRAVENOUS | Status: DC | PRN

## 2024-05-08 MED ORDER — CEFAZOLIN SODIUM-DEXTROSE 2-4 GM/100ML-% IV SOLN
2.0000 g | INTRAVENOUS | Status: AC
  Administered 2024-05-08: 2 g via INTRAVENOUS

## 2024-05-08 MED ORDER — PHENYLEPHRINE HCL-NACL 20-0.9 MG/250ML-% IV SOLN
INTRAVENOUS | Status: AC
  Filled 2024-05-08: qty 250

## 2024-05-08 MED ORDER — KETAMINE HCL 50 MG/ML IJ SOLN
INTRAMUSCULAR | Status: AC
  Filled 2024-05-08: qty 10

## 2024-05-08 MED ORDER — MISOPROSTOL 50MCG HALF TABLET
ORAL_TABLET | ORAL | Status: AC
Start: 2024-05-08 — End: 2024-05-08
  Filled 2024-05-08: qty 1

## 2024-05-08 MED ORDER — SOD CITRATE-CITRIC ACID 500-334 MG/5ML PO SOLN: 30.0000 mL | ORAL | Status: DC

## 2024-05-08 MED ORDER — TRANEXAMIC ACID-NACL 1000-0.7 MG/100ML-% IV SOLN
INTRAVENOUS | Status: AC
  Filled 2024-05-08: qty 100

## 2024-05-08 MED ORDER — MISOPROSTOL 25 MCG QUARTER TABLET
25.0000 ug | ORAL_TABLET | Freq: Once | ORAL | Status: AC
Start: 2024-05-07 — End: 2024-05-07

## 2024-05-08 MED ORDER — MORPHINE SULFATE (PF) 0.5 MG/ML IJ SOLN
INTRAMUSCULAR | Status: DC | PRN
  Administered 2024-05-08: 3 mg via EPIDURAL

## 2024-05-08 MED ORDER — MISOPROSTOL 50MCG HALF TABLET: 50.0000 ug | ORAL_TABLET | Freq: Once | ORAL | Status: AC

## 2024-05-08 MED ORDER — DIPHENHYDRAMINE HCL 50 MG/ML IJ SOLN: 12.5000 mg | INTRAMUSCULAR | Status: DC | PRN

## 2024-05-08 MED ORDER — KETOROLAC TROMETHAMINE 30 MG/ML IJ SOLN
INTRAMUSCULAR | Status: DC | PRN
  Administered 2024-05-08: 30 mg via INTRAVENOUS

## 2024-05-08 MED ORDER — LACTATED RINGERS IV SOLN: INTRAVENOUS | Status: DC | PRN

## 2024-05-08 MED ORDER — DEXAMETHASONE SODIUM PHOSPHATE 10 MG/ML IJ SOLN
INTRAMUSCULAR | Status: AC
  Filled 2024-05-08: qty 1

## 2024-05-08 MED ORDER — MISOPROSTOL 50MCG HALF TABLET: 50.0000 ug | ORAL_TABLET | Freq: Once | ORAL | Status: DC

## 2024-05-08 MED ORDER — SOD CITRATE-CITRIC ACID 500-334 MG/5ML PO SOLN
ORAL | Status: AC
  Filled 2024-05-08: qty 15

## 2024-05-08 MED ORDER — KETOROLAC TROMETHAMINE 30 MG/ML IJ SOLN
INTRAMUSCULAR | Status: AC
  Filled 2024-05-08: qty 1

## 2024-05-08 MED ORDER — ONDANSETRON HCL 4 MG/2ML IJ SOLN
INTRAMUSCULAR | Status: DC | PRN
  Administered 2024-05-08: 4 mg via INTRAVENOUS

## 2024-05-08 MED ORDER — SODIUM CHLORIDE 0.9 % IV SOLN
INTRAVENOUS | Status: AC
  Filled 2024-05-08: qty 5

## 2024-05-08 MED ORDER — FENTANYL-BUPIVACAINE-NACL 0.5-0.125-0.9 MG/250ML-% EP SOLN
EPIDURAL | Status: AC
  Filled 2024-05-08: qty 250

## 2024-05-08 MED ORDER — ONDANSETRON HCL 4 MG/2ML IJ SOLN
INTRAMUSCULAR | Status: AC
  Filled 2024-05-08: qty 2

## 2024-05-08 MED ORDER — SODIUM CHLORIDE 0.9 % IV SOLN
500.0000 mg | INTRAVENOUS | Status: AC
  Administered 2024-05-08: 500 mg via INTRAVENOUS

## 2024-05-08 MED ORDER — BUPIVACAINE HCL (PF) 0.25 % IJ SOLN
INTRAMUSCULAR | Status: DC | PRN
Start: 2024-05-08 — End: 2024-05-08
  Administered 2024-05-08: 4 mL via EPIDURAL
  Administered 2024-05-08: 3 mL via EPIDURAL

## 2024-05-08 MED ORDER — LACTATED RINGERS IV SOLN
500.0000 mL | Freq: Once | INTRAVENOUS | Status: AC
  Administered 2024-05-08: 500 mL via INTRAVENOUS

## 2024-05-08 MED ORDER — LIDOCAINE HCL (PF) 2 % IJ SOLN
INTRAMUSCULAR | Status: DC | PRN
Start: 2024-05-08 — End: 2024-05-08

## 2024-05-08 MED ORDER — LIDOCAINE HCL (PF) 2 % IJ SOLN
INTRAMUSCULAR | Status: DC | PRN
Start: 2024-05-08 — End: 2024-05-08
  Administered 2024-05-08 (×3): 5 mL via EPIDURAL

## 2024-05-08 MED ORDER — PROPOFOL 10 MG/ML IV BOLUS
INTRAVENOUS | Status: AC
  Filled 2024-05-08: qty 20

## 2024-05-08 MED ORDER — FENTANYL-BUPIVACAINE-NACL 0.5-0.125-0.9 MG/250ML-% EP SOLN
12.0000 mL/h | EPIDURAL | Status: DC | PRN
  Administered 2024-05-08: 12 mL/h via EPIDURAL

## 2024-05-08 SURGICAL SUPPLY — 31 items
ADHESIVE MASTISOL STRL (MISCELLANEOUS) ×1 IMPLANT
BAG COUNTER SPONGE SURGICOUNT (BAG) ×1 IMPLANT
BENZOIN TINCTURE PRP APPL 2/3 (GAUZE/BANDAGES/DRESSINGS) IMPLANT
CHLORAPREP W/TINT 26 (MISCELLANEOUS) ×2 IMPLANT
DRESSING TELFA 8X3 (GAUZE/BANDAGES/DRESSINGS) IMPLANT
DRSG TELFA 3X8 NADH STRL (GAUZE/BANDAGES/DRESSINGS) ×1 IMPLANT
ELECTRODE REM PT RTRN 9FT ADLT (ELECTROSURGICAL) IMPLANT
GAUZE SPONGE 4X4 12PLY STRL (GAUZE/BANDAGES/DRESSINGS) ×1 IMPLANT
GAUZE SPONGE 4X4 16PLY XRAY LF (GAUZE/BANDAGES/DRESSINGS) IMPLANT
GLOVE PI ORTHO PRO STRL 7.5 (GLOVE) ×1 IMPLANT
GOWN STRL REUS W/ TWL LRG LVL3 (GOWN DISPOSABLE) ×2 IMPLANT
KIT TURNOVER KIT A (KITS) ×1 IMPLANT
MANIFOLD NEPTUNE II (INSTRUMENTS) ×1 IMPLANT
MAT PREVALON FULL STRYKER (MISCELLANEOUS) ×1 IMPLANT
NS IRRIG 1000ML POUR BTL (IV SOLUTION) ×1 IMPLANT
PACK C SECTION AR (MISCELLANEOUS) ×1 IMPLANT
PAD ABD DERMACEA PRESS 5X9 (GAUZE/BANDAGES/DRESSINGS) IMPLANT
PAD OB MATERNITY 11 LF (PERSONAL CARE ITEMS) ×1 IMPLANT
PAD PREP OB/GYN DISP 24X41 (PERSONAL CARE ITEMS) ×1 IMPLANT
RETRACTOR TRAXI PANNICULUS (MISCELLANEOUS) IMPLANT
RETRACTOR WND ALEXIS-O 25 LRG (MISCELLANEOUS) ×1 IMPLANT
SCRUB CHG 4% DYNA-HEX 4OZ (MISCELLANEOUS) ×1 IMPLANT
SPONGE T-LAP 18X18 ~~LOC~~+RFID (SPONGE) ×1 IMPLANT
STRIP CLOSURE SKIN 1/2X4 (GAUZE/BANDAGES/DRESSINGS) IMPLANT
STRIP CLOSURE SKIN 1/8X3 (GAUZE/BANDAGES/DRESSINGS) IMPLANT
SUT VIC AB 0 CTX36XBRD ANBCTRL (SUTURE) ×2 IMPLANT
SUT VIC AB 1 CT1 36 (SUTURE) ×2 IMPLANT
SUT VICRYL 3-0 27IN (SUTURE) IMPLANT
SUT VICRYL+ 3-0 36IN CT-1 (SUTURE) ×2 IMPLANT
TRAP FLUID SMOKE EVACUATOR (MISCELLANEOUS) ×1 IMPLANT
WATER STERILE IRR 500ML POUR (IV SOLUTION) ×1 IMPLANT

## 2024-05-08 NOTE — Anesthesia Preprocedure Evaluation (Signed)
 Anesthesia Evaluation  Patient identified by MRN, date of birth, ID band Patient awake    Reviewed: Allergy & Precautions, H&P , NPO status , Patient's Chart, lab work & pertinent test results, reviewed documented beta blocker date and time   History of Anesthesia Complications Negative for: history of anesthetic complications  Airway Mallampati: II  TM Distance: >3 FB Neck ROM: full    Dental no notable dental hx.    Pulmonary former smoker   Pulmonary exam normal        Cardiovascular hypertension, Normal cardiovascular exam     Neuro/Psych  PSYCHIATRIC DISORDERS   Bipolar Disorder Schizophrenia  negative neurological ROS     GI/Hepatic negative GI ROS, Neg liver ROS,,,  Endo/Other  negative endocrine ROS    Renal/GU negative Renal ROS  negative genitourinary   Musculoskeletal   Abdominal   Peds  Hematology negative hematology ROS (+)   Anesthesia Other Findings Past Medical History: No date: Bipolar depression (HCC) 2021: Schizophrenic disorder (HCC)   Reproductive/Obstetrics (+) Pregnancy                              Anesthesia Physical Anesthesia Plan  ASA: 3  Anesthesia Plan: Epidural   Post-op Pain Management:    Induction:   PONV Risk Score and Plan:   Airway Management Planned:   Additional Equipment:   Intra-op Plan:   Post-operative Plan:   Informed Consent: I have reviewed the patients History and Physical, chart, labs and discussed the procedure including the risks, benefits and alternatives for the proposed anesthesia with the patient or authorized representative who has indicated his/her understanding and acceptance.       Plan Discussed with: Anesthesiologist  Anesthesia Plan Comments:         Anesthesia Quick Evaluation

## 2024-05-08 NOTE — Anesthesia Procedure Notes (Signed)
 Date/Time: 05/08/2024 10:30 PM  Performed by: Tod Handing, CRNAPre-anesthesia Checklist: Patient identified, Emergency Drugs available, Suction available and Patient being monitored Patient Re-evaluated:Patient Re-evaluated prior to induction Oxygen Delivery Method: Nasal cannula Induction Type: IV induction Dental Injury: Teeth and Oropharynx as per pre-operative assessment  Comments: Nasal cannula with etCO2 monitoring

## 2024-05-08 NOTE — Progress Notes (Signed)
 LABOR NOTE   Madison Boyer 23 y.o.GP@ at [redacted]w[redacted]d  SUBJECTIVE:  Comfortable with epidural. Denies feeling in pressure Analgesia: Epidural  OBJECTIVE:  BP 129/74   Pulse 86   Temp 98 F (36.7 C) (Oral)   Resp 16   Ht 5' 2 (1.575 m)   Wt 102.1 kg   LMP 08/03/2023   SpO2 99%   BMI 41.15 kg/m  Total I/O In: 1305.8 [P.O.:60; I.V.:722.5; IV Piggyback:523.3] Out: 475 [Urine:475]  She has not shown cervical change. CERVIX: 6-7cm:  100%:   -2:   mid position:   soft SVE:   Dilation: 6 Effacement (%): 90 Station: -2 Exam by:: Madison Alvine, RN CONTRACTIONS: regular, every 1-6 minutes FHR: Fetal heart tracing reviewed. Baseline: 125 bpm, Variability: Good {> 6 bpm), Accelerations: Reactive, and Decelerations: Absent Category I    Labs: Lab Results  Component Value Date   WBC 13.4 (H) 05/08/2024   HGB 14.3 05/08/2024   HCT 40.0 05/08/2024   MCV 87.1 05/08/2024   PLT 230 05/08/2024    ASSESSMENT: 1) Labor curve reviewed.       Progress: Active phase labor.     Membranes: ruptured            Principal Problem:   Preeclampsia, severe, third trimester Active Problems:   Bicornuate uterus     Overview: Needs MFM anatomy US    Susceptible to varicella (non-immune), currently pregnant   Rubella non-immune status, antepartum   PLAN: continue present management Dicussed with pt that she has had adequate contractions for the past few hours with minimal change in her cervix. Discussed possibility that fetal head will not fit through pelvis. Discussed position change and use of peanut ball and re evaluate for cervical change in 2 hours. She is in agreement. Dr. Janit notified of pt progress.   Zelda Hummer, CNM  05/08/2024 6:01 PM

## 2024-05-08 NOTE — Progress Notes (Signed)
 LABOR NOTE   Emelia Negri Carranza 23 y.o.GP@ at [redacted]w[redacted]d  SUBJECTIVE:  Pt denies feeling any pressure or urge to push Analgesia: Epidural  OBJECTIVE:  BP 134/78   Pulse 95   Temp (!) 97.4 F (36.3 C) (Oral)   Resp 16   Ht 5' 2 (1.575 m)   Wt 102.1 kg   LMP 08/03/2023   SpO2 100%   BMI 41.15 kg/m  Total I/O In: 400 [P.O.:400] Out: 475 [Urine:475]  She has shown cervical change. CERVIX: 8cm:  100%:   -1:   mid position:   soft SVE:   Dilation: 8 Effacement (%): 100 Station: 0 Exam by:: K. Lamoureux, RN CONTRACTIONS: regular, every 1-4 minutes FHR: Fetal heart tracing reviewed. Baseline: 120 bpm, Variability: Good {> 6 bpm), Accelerations: Reactive, and Decelerations: prolonged  x 10 with vaginal exam , while trying to reduce the cervix. Category II    Labs: Lab Results  Component Value Date   WBC 13.4 (H) 05/08/2024   HGB 14.3 05/08/2024   HCT 40.0 05/08/2024   MCV 87.1 05/08/2024   PLT 230 05/08/2024    ASSESSMENT: 1) Labor curve reviewed.       Progress: Active phase labor. and Arrest of progress - first stage labor.     Membranes: ruptured, clear fluid            Principal Problem:   Preeclampsia, severe, third trimester Active Problems:   Bicornuate uterus     Overview: Needs MFM anatomy US    Susceptible to varicella (non-immune), currently pregnant   Rubella non-immune status, antepartum   PLAN: Discussed minimal progress over the past 7 hours despite increasing pitocin . Discussed significant caput on cervical exam and fetal station remains at -1. Discussed with Dr . Janit. We are recommending cesarean delivery for failure to progress. Pt and her family are in agreement. Consent signed. Anesthesia notified.   Zelda Hummer, CNM  05/08/2024 10:11 PM

## 2024-05-08 NOTE — Progress Notes (Signed)
 LABOR NOTE   Madison Boyer 23 y.o.GP@ at [redacted]w[redacted]d  SUBJECTIVE:  On the birth ball at the side of bed.  Analgesia: Labor support without medications  OBJECTIVE:  BP 118/74   Pulse (!) 118   Temp 98 F (36.7 C) (Oral)   Resp 15   Ht 5' 2 (1.575 m)   Wt 102.1 kg   LMP 08/03/2023   SpO2 98%   BMI 41.15 kg/m  Total I/O In: 1172.6 [P.O.:60; I.V.:612.6; IV Piggyback:500] Out: 275 [Urine:275]  She has shown cervical change. CERVIX: 4-5 cm:  90%:   -2:   posterior:   soft SVE:   Dilation: 4.5 Effacement (%): 90 Station: -2 Exam by:: A. Sebastian, CNM CONTRACTIONS: difficult to evaluate due to pt sitting on ball FHR: Fetal heart tracing reviewed. Baseline: 130 bpm, Variability: Good {> 6 bpm), Accelerations: Non-reactive but appropriate for gestational age, and Decelerations: Early Category I    Labs: Lab Results  Component Value Date   WBC 10.7 (H) 05/07/2024   HGB 13.0 05/07/2024   HCT 37.2 05/07/2024   MCV 87.3 05/07/2024   PLT 228 05/07/2024    ASSESSMENT: 1) Labor curve reviewed.       Progress: Early latent labor.     Membranes: ruptured, clear fluid, IUPC placed    Principal Problem:   Preeclampsia, severe, third trimester Active Problems:   Bicornuate uterus     Overview: Needs MFM anatomy US    Susceptible to varicella (non-immune), currently pregnant   Rubella non-immune status, antepartum   PLAN: continue present management Dr. Janit aware of pt progress.   Zelda Sebastian, CNM  05/08/2024 12:35 PM

## 2024-05-08 NOTE — Progress Notes (Signed)
 LABOR NOTE   Madison Boyer 23 y.o.GP@ at [redacted]w[redacted]d  SUBJECTIVE:  Comfortable with epidural   Analgesia: Epidural  OBJECTIVE:  BP (!) 140/75   Pulse 98   Temp 97.9 F (36.6 C) (Oral)   Resp 15   Ht 5' 2 (1.575 m)   Wt 102.1 kg   LMP 08/03/2023   SpO2 99%   BMI 41.15 kg/m  Total I/O In: 1305.8 [P.O.:60; I.V.:722.5; IV Piggyback:523.3] Out: 475 [Urine:475]   CERVIX:  exam deferred SVE:   Dilation: 6 Effacement (%): 90 Station: -2 Exam by:: Excell Alvine, RN CONTRACTIONS: regular, every 1-4 minutes FHR: Fetal heart tracing reviewed. Baseline: 120 bpm, Variability: Good {> 6 bpm), Accelerations: Reactive, and Decelerations: Absent Category I    Labs: Lab Results  Component Value Date   WBC 13.4 (H) 05/08/2024   HGB 14.3 05/08/2024   HCT 40.0 05/08/2024   MCV 87.1 05/08/2024   PLT 230 05/08/2024    ASSESSMENT: 1) Labor curve reviewed.       Progress: Active phase labor.     Membranes: ruptured, clear fluid            Principal Problem:   Preeclampsia, severe, third trimester Active Problems:   Bicornuate uterus     Overview: Needs MFM anatomy US    Susceptible to varicella (non-immune), currently pregnant   Rubella non-immune status, antepartum   PLAN: continue present management  Zelda Hummer, CNM  05/08/2024 3:51 PM

## 2024-05-08 NOTE — Op Note (Signed)
     OP NOTE  Date: 05/08/2024   11:38 PM Name Madison Boyer MR# [id number]  Preoperative Diagnosis: 1. Intrauterine pregnancy at [redacted]w[redacted]d Principal Problem:   Preeclampsia, severe, third trimester Active Problems:   Bicornuate uterus   Susceptible to varicella (non-immune), currently pregnant   Rubella non-immune status, antepartum  2.  Failure to descend 3.  Failure to dilate 4. Small pelvis   Postoperative Diagnosis: 1. Intrauterine pregnancy at [redacted]w[redacted]d, delivered 2. Viable infant 3. Asynclitic presentation 4. Bicornuate uterus 5. Remainder same as pre-op  Based on baby size, small pelvis, bicornuate uterus - likely not a good candidate for TOLAC  in the future Procedure: 1. Primary Low-Transverse Cesarean Section  Surgeon: Alm DOROTHA Sar, MD  Assistant:  Sebastian, CNM  Anesthesia: Epidural  EBL: 625 ml    Findings: 1) female infant, Apgar scores of 8   at 1 minute and 9   at 5 minutes and a birthweight of 89.24 ounces.    2) Bicornuate uterus   Procedure:  The patient was prepped and draped in the supine position and placed under spinal anesthesia.  A transverse incision was made across the abdomen in a Pfannenstiel manner. If indicated the old scar was systematically removed with sharp dissection.  We carried the dissection down to the level of the fascia.  The fascia was incised in a curvilinear manner.  The fascia was then elevated from the rectus muscles with blunt and sharp dissection.  The rectus muscles were separated laterally exposing the peritoneum.  The peritoneum was carefully entered with care being taken to avoid bowel and bladder.  A self-retaining retractor was placed.  The visceral peritoneum was incised in a curvilinear fashion across the lower uterine segment creating a bladder flap. A transverse incision was made across the lower uterine segment and extended laterally and superiorly with blunt dissection.  Artificial rupture membranes was performed  and Clear fluid was noted.  The infant was delivered from the cephalic position.  A nuchal cord was not present. After an appropriate time interval, the cord was doubly clamped and cut. Cord blood was obtained if required.  The infant was handed to the pediatric personnel  who then placed the infant under heat lamps where it was cleaned dried and suctioned as needed. The placenta was delivered. The hysterotomy incision was then identified on ring forceps.  The uterine cavity was cleaned with a moist lap sponge.  The hysterotomy incision was closed with a running interlocking suture of Vicryl.  Hemostasis was excellent.  Pitocin  was run in the IV and the uterus was found to be firm. The posterior cul-de-sac and gutters were cleaned and inspected.  Hemostasis was noted.  The fascia was then closed with a running suture of #1 Vicryl.  Hemostasis of the subcutaneous tissues was obtained using the Bovie.  The subcutaneous tissues were closed with a running suture of 000 Vicryl.  A subcuticular suture was placed.  Steri-strips were applied in the usual manner.  A Lidoderm  patch was applied.  A pressure dressing was placed.  The patient went to the recovery room in stable condition. Sebastian, CNM provided exposure, dissection, suctioning, retraction, and general support and assistance during the procedure.   Alm DOROTHA Sar, M.D. 05/08/2024 11:38 PM

## 2024-05-08 NOTE — Progress Notes (Signed)
 Labor Progress Note   ASSESSMENT/PLAN   Madison Boyer 23 y.o.   G1P0000  at [redacted]w[redacted]d here for pre-eclampsia with severe features. - Magnesium  sulfate for seizure prophylaxis - RUQ pain improved - Adequate urine output - BP remains mild range  FWB:  - Fetal well being assessed: Category 1       GBS: - GBS: unknown - Prophylaxis: No penicillin  allergy, treat with penicillin     LABOR: - Now in early labor, doing well. Has received misoprostol  x3, last dose at 0315 - Pain Management: coping well, aware of options - Discussed options with patient and will start pitocin  - Anticipate SVD   Principal Problem:   Preeclampsia, severe, third trimester Active Problems:   Bicornuate uterus     Overview: Needs MFM anatomy US    Susceptible to varicella (non-immune), currently pregnant   Rubella non-immune status, antepartum   SUBJECTIVE/OBJECTIVE   SUBJECTIVE: Resting quietly, feeling occaisonal cramping. Rioght sided pain now achy/sore-improved from yesterda, endorses back pain but feels due to contractions & position. Denies headache or visual changes. Mother at bedside, supportive.    OBJECTIVE: Vital Signs: Patient Vitals for the past 12 hrs:  BP Temp Temp src Pulse Resp SpO2  05/08/24 0625 131/67 -- -- 67 -- 98 %  05/08/24 0603 -- -- -- -- 16 --  05/08/24 0525 (!) 146/87 -- -- 67 -- 98 %  05/08/24 0505 (!) 150/83 -- -- 75 16 99 %  05/08/24 0425 (!) 146/97 -- -- 71 -- 99 %  05/08/24 0402 -- (!) 97 F (36.1 C) Axillary -- 20 --  05/08/24 0330 -- -- -- -- -- 100 %  05/08/24 0325 (!) 142/80 -- -- 63 -- --  05/08/24 0301 -- -- -- -- 16 --  05/08/24 0215 127/80 -- -- 62 -- 100 %  05/08/24 0202 -- -- -- -- 16 --  05/08/24 0115 139/84 -- -- 64 -- 99 %  05/08/24 0102 -- -- -- -- 18 --  05/08/24 0015 138/88 -- -- 69 -- 100 %  05/08/24 0000 -- 98.2 F (36.8 C) Oral -- 14 --  05/07/24 2315 (!) 143/83 -- -- 78 -- 100 %  05/07/24 2300 -- -- -- -- 16 --  05/07/24 2202 (!)  142/84 -- -- 82 18 100 %  05/07/24 2102 138/80 -- -- 71 20 --  05/07/24 2002 (!) 143/86 97.8 F (36.6 C) Oral 75 18 99 %   I/O last 3 completed shifts: In: 2352.1 [P.O.:805; I.V.:1547.1] Out: 1800 [Urine:1800] Total I/O In: 294.4 [I.V.:294.4] Out: -     Last SVE:  Dilation: 4 Effacement (%): 70 Cervical Position: Posterior Station: -2 Presentation: Vertex Exam by:: DOROTHA Cisco, CNM -  ,  ,  ,    FHR:   - Mode: External  - Baseline Rate (A): 135 bpm  -  Moderate variability  - Characteristics (ie - accels, decels): accels present, decels absent  -    UTERINE ACTIVITY:   - Mode: Toco  - Contraction Frequency (min): occ

## 2024-05-08 NOTE — Transfer of Care (Signed)
  Anesthesia Post-op Note  Patient: Madison Boyer  Procedure(s) Performed: Procedure(s): CESAREAN DELIVERY (N/A)  Patient Location: LDR 3  Anesthesia Type: Epidural  Level of Consciousness: awake, alert  and oriented  Airway and Oxygen Therapy: Patient Spontanous Breathing  Post-op Pain: none  Post-op Assessment: Post-op Vital signs reviewed  Post-op Vital Signs: Reviewed and stable  Last Vitals:  Vitals:   05/08/24 2151 05/08/24 2349  BP: 134/78 (!) 107/94  Pulse: 95   Resp:  18  Temp:    SpO2: 100% 99%    Complications: No apparent anesthesia complications

## 2024-05-08 NOTE — Progress Notes (Signed)
 LABOR NOTE   Madison Boyer 23 y.o.GP@ at [redacted]w[redacted]d  SUBJECTIVE:  Pt has mild headache, she declines tylenol  at this time  Analgesia: Labor support without medications  OBJECTIVE:  BP (!) 152/85   Pulse (!) 102   Temp 98 F (36.7 C) (Oral)   Resp 16   Ht 5' 2 (1.575 m)   Wt 102.1 kg   LMP 08/03/2023   SpO2 100%   BMI 41.15 kg/m  Total I/O In: 125.5 [I.V.:125.5] Out: 275 [Urine:275]  She has shown cervical change. CERVIX: 4-5 cm:  70%:   -2:   posterior:   firm SVE:   Dilation: 4 Effacement (%): 70 Station: -2 Exam by:: DOROTHA Cisco, CNM CONTRACTIONS: regular, every 2-7 minutes FHR: Fetal heart tracing reviewed. Baseline: 125 bpm, Variability: Good {> 6 bpm), Accelerations: Reactive, and Decelerations: Absent Category I    Labs: Lab Results  Component Value Date   WBC 10.7 (H) 05/07/2024   HGB 13.0 05/07/2024   HCT 37.2 05/07/2024   MCV 87.3 05/07/2024   PLT 228 05/07/2024    ASSESSMENT: 1) Labor curve reviewed.       Progress: latent phase     Membranes: ruptured, AROM , Clear       Principal Problem:   Preeclampsia, severe, third trimester Active Problems:   Bicornuate uterus     Overview: Needs MFM anatomy US    Susceptible to varicella (non-immune), currently pregnant   Rubella non-immune status, antepartum   PLAN: continue present management   Madison Boyer, CNM  05/08/2024 9:59 AM

## 2024-05-08 NOTE — Anesthesia Procedure Notes (Signed)
 Epidural Patient location during procedure: OB Start time: 05/08/2024 3:00 PM End time: 05/08/2024 3:06 PM  Staffing Anesthesiologist: Dario Barter, MD Resident/CRNA: Tod Handing, CRNA Performed: resident/CRNA   Preanesthetic Checklist Completed: patient identified, IV checked, site marked, risks and benefits discussed, surgical consent, monitors and equipment checked, pre-op evaluation and timeout performed  Epidural Patient position: sitting Prep: ChloraPrep Patient monitoring: heart rate, continuous pulse ox and blood pressure Approach: midline Location: L3-L4 Injection technique: LOR saline  Needle:  Needle type: Tuohy  Needle gauge: 17 G Needle length: 9 cm and 9 Needle insertion depth: 9 cm Catheter type: closed end flexible Catheter size: 19 Gauge Catheter at skin depth: 15 cm Test dose: negative and 1.5% lidocaine  with Epi 1:200 K  Assessment Sensory level: T10 Events: blood not aspirated, no cerebrospinal fluid, injection not painful, no injection resistance, no paresthesia and negative IV test  Additional Notes 1 attempt Pt. Evaluated and documentation done after procedure finished. Patient identified. Risks/Benefits/Options discussed with patient including but not limited to bleeding, infection, nerve damage, paralysis, failed block, incomplete pain control, headache, blood pressure changes, nausea, vomiting, reactions to medication both or allergic, itching and postpartum back pain. Confirmed with bedside nurse the patient's most recent platelet count. Confirmed with patient that they are not currently taking any anticoagulation, have any bleeding history or any family history of bleeding disorders. Patient expressed understanding and wished to proceed. All questions were answered. Sterile technique was used throughout the entire procedure. Please see nursing notes for vital signs. Test dose was given through epidural catheter and negative prior to continuing to dose  epidural or start infusion. Warning signs of high block given to the patient including shortness of breath, tingling/numbness in hands, complete motor block, or any concerning symptoms with instructions to call for help. Patient was given instructions on fall risk and not to get out of bed. All questions and concerns addressed with instructions to call with any issues or inadequate analgesia.    Patient tolerated the insertion well without immediate complications.Reason for block:procedure for pain

## 2024-05-09 ENCOUNTER — Encounter: Payer: Self-pay | Admitting: Obstetrics and Gynecology

## 2024-05-09 LAB — CERVICOVAGINAL ANCILLARY ONLY
Chlamydia: NEGATIVE
Comment: NEGATIVE
Comment: NORMAL
Neisseria Gonorrhea: NEGATIVE

## 2024-05-09 LAB — CBC
HCT: 37.5 % (ref 36.0–46.0)
Hemoglobin: 12.9 g/dL (ref 12.0–15.0)
MCH: 30.4 pg (ref 26.0–34.0)
MCHC: 34.4 g/dL (ref 30.0–36.0)
MCV: 88.2 fL (ref 80.0–100.0)
Platelets: 211 K/uL (ref 150–400)
RBC: 4.25 MIL/uL (ref 3.87–5.11)
RDW: 13.2 % (ref 11.5–15.5)
WBC: 18.6 K/uL — ABNORMAL HIGH (ref 4.0–10.5)
nRBC: 0 % (ref 0.0–0.2)

## 2024-05-09 MED ORDER — ONDANSETRON HCL 4 MG/2ML IJ SOLN: 4.0000 mg | Freq: Three times a day (TID) | INTRAMUSCULAR | Status: DC | PRN

## 2024-05-09 MED ORDER — KETOROLAC TROMETHAMINE 30 MG/ML IJ SOLN: 30.0000 mg | Freq: Four times a day (QID) | INTRAMUSCULAR | Status: AC | PRN

## 2024-05-09 MED ORDER — LABETALOL HCL 5 MG/ML IV SOLN: 40.0000 mg | INTRAVENOUS | Status: DC | PRN

## 2024-05-09 MED ORDER — DIPHENHYDRAMINE HCL 25 MG PO CAPS: 25.0000 mg | ORAL_CAPSULE | Freq: Four times a day (QID) | ORAL | Status: DC | PRN

## 2024-05-09 MED ORDER — MEPERIDINE HCL 25 MG/ML IJ SOLN: 6.2500 mg | INTRAMUSCULAR | Status: DC | PRN

## 2024-05-09 MED ORDER — OXYTOCIN-SODIUM CHLORIDE 30-0.9 UT/500ML-% IV SOLN
2.5000 [IU]/h | INTRAVENOUS | Status: AC
  Administered 2024-05-09: 2.5 [IU]/h via INTRAVENOUS
  Filled 2024-05-09: qty 500

## 2024-05-09 MED ORDER — KETOROLAC TROMETHAMINE 30 MG/ML IJ SOLN
30.0000 mg | Freq: Four times a day (QID) | INTRAMUSCULAR | Status: AC | PRN
  Administered 2024-05-09 (×2): 30 mg via INTRAVENOUS
  Filled 2024-05-09 (×2): qty 1

## 2024-05-09 MED ORDER — SENNOSIDES-DOCUSATE SODIUM 8.6-50 MG PO TABS
2.0000 | ORAL_TABLET | Freq: Every day | ORAL | Status: DC
  Administered 2024-05-09 – 2024-05-10 (×2): 2 via ORAL
  Filled 2024-05-09 (×2): qty 2

## 2024-05-09 MED ORDER — DIPHENHYDRAMINE HCL 50 MG/ML IJ SOLN: 12.5000 mg | INTRAMUSCULAR | Status: DC | PRN

## 2024-05-09 MED ORDER — SODIUM CHLORIDE 0.9% FLUSH: 3.0000 mL | INTRAVENOUS | Status: DC | PRN

## 2024-05-09 MED ORDER — HYDRALAZINE HCL 20 MG/ML IJ SOLN: 10.0000 mg | INTRAMUSCULAR | Status: DC | PRN

## 2024-05-09 MED ORDER — DIPHENHYDRAMINE HCL 25 MG PO CAPS: 25.0000 mg | ORAL_CAPSULE | ORAL | Status: DC | PRN

## 2024-05-09 MED ORDER — LABETALOL HCL 5 MG/ML IV SOLN: 20.0000 mg | INTRAVENOUS | Status: DC | PRN

## 2024-05-09 MED ORDER — SCOPOLAMINE 1 MG/3DAYS TD PT72: 1.0000 | MEDICATED_PATCH | Freq: Once | TRANSDERMAL | Status: DC

## 2024-05-09 MED ORDER — MENTHOL 3 MG MT LOZG: 1.0000 | LOZENGE | OROMUCOSAL | Status: DC | PRN

## 2024-05-09 MED ORDER — PRENATAL MULTIVITAMIN CH
1.0000 | ORAL_TABLET | Freq: Every day | ORAL | Status: DC
  Administered 2024-05-09 – 2024-05-11 (×3): 1 via ORAL
  Filled 2024-05-09 (×3): qty 1

## 2024-05-09 MED ORDER — LACTATED RINGERS IV SOLN: INTRAVENOUS | Status: AC

## 2024-05-09 MED ORDER — ZOLPIDEM TARTRATE 5 MG PO TABS: 5.0000 mg | ORAL_TABLET | Freq: Every evening | ORAL | Status: DC | PRN

## 2024-05-09 MED ORDER — LACTATED RINGERS IV SOLN: INTRAVENOUS | Status: DC

## 2024-05-09 MED ORDER — OXYCODONE HCL 5 MG PO TABS: 5.0000 mg | ORAL_TABLET | Freq: Four times a day (QID) | ORAL | Status: DC | PRN

## 2024-05-09 MED ORDER — LABETALOL HCL 5 MG/ML IV SOLN: 80.0000 mg | INTRAVENOUS | Status: DC | PRN

## 2024-05-09 MED ORDER — NALOXONE HCL 0.4 MG/ML IJ SOLN: 0.4000 mg | INTRAMUSCULAR | Status: DC | PRN

## 2024-05-09 MED ORDER — OXYCODONE-ACETAMINOPHEN 5-325 MG PO TABS
1.0000 | ORAL_TABLET | ORAL | Status: DC | PRN
  Administered 2024-05-09: 2 via ORAL
  Filled 2024-05-09: qty 2

## 2024-05-09 MED ORDER — MAGNESIUM SULFATE 40 GM/1000ML IV SOLN
2.0000 g/h | INTRAVENOUS | Status: DC
  Administered 2024-05-09: 2 g/h via INTRAVENOUS
  Filled 2024-05-09: qty 1000

## 2024-05-09 MED ORDER — SIMETHICONE 80 MG PO CHEW
80.0000 mg | CHEWABLE_TABLET | Freq: Four times a day (QID) | ORAL | Status: DC
  Administered 2024-05-09 – 2024-05-11 (×8): 80 mg via ORAL
  Filled 2024-05-09 (×8): qty 1

## 2024-05-09 MED ORDER — SENNOSIDES-DOCUSATE SODIUM 8.6-50 MG PO TABS: 2.0000 | ORAL_TABLET | ORAL | Status: DC

## 2024-05-09 MED ORDER — IBUPROFEN 600 MG PO TABS
600.0000 mg | ORAL_TABLET | Freq: Four times a day (QID) | ORAL | Status: DC
  Administered 2024-05-09 – 2024-05-11 (×7): 600 mg via ORAL
  Filled 2024-05-09 (×7): qty 1

## 2024-05-09 MED ORDER — NALOXONE HCL 4 MG/10ML IJ SOLN: 1.0000 ug/kg/h | INTRAVENOUS | Status: DC | PRN

## 2024-05-09 NOTE — Discharge Summary (Signed)
 Postpartum Discharge Summary  Date of Service updated 05/11/24      Patient Name: Madison Boyer DOB: 2001/03/01 MRN: [id number]  Date of admission: 05/07/2024 Delivery date:05/08/2024 Delivering provider: JANIT ALM AGENT Date of discharge: 05/11/2024  Admitting diagnosis: Elevated blood pressure affecting pregnancy in third trimester, antepartum [O16.3] Preeclampsia, severe, third trimester [O14.13] Intrauterine pregnancy: [redacted]w[redacted]d     Secondary diagnosis:  Principal Problem:   Preeclampsia, severe, third trimester Active Problems:   Bicornuate uterus   Susceptible to varicella (non-immune), currently pregnant   Rubella non-immune status, antepartum Primary cesarean section  Additional problems: Failure to progress    Discharge diagnosis: Term Pregnancy Delivered and Preeclampsia (severe)                                              Post partum procedures:magnesium  sulfate Augmentation: AROM and Pitocin  Complications: None  Hospital course: Induction of Labor With Cesarean Section   23 y.o. yo G1P0101 at [redacted]w[redacted]d was admitted to the hospital 05/07/2024 for induction of labor. Patient had a labor course significant for pre eclampsia with severe features on Magnesium  sulfate therapy. The patient went for cesarean section due to Arrest of Dilation. Delivery details are as follows: Membrane Rupture Time/Date: 9:55 AM,05/08/2024  Delivery Method:C-Section, Low Transverse Operative Delivery:N/A Details of operation can be found in separate operative Note.  Patient had an postpartum course. She is ambulating, tolerating a regular diet, passing flatus, and urinating well.  Patient is discharged home in stable condition on 05/11/24.      Newborn Data: Birth date:05/08/2024 Birth time:11:05 PM Gender:Female Living status:Living Apgars:8 ,9  Weight:2530 g                               Magnesium  Sulfate received: Yes: Seizure prophylaxis BMZ received: No Rhophylac:N/A MMR: ordered  PP T-DaP: ordered PP Flu: N/A RSV Vaccine received: No Transfusion:No Immunizations administered: Immunization History  Administered Date(s) Administered   Hpv-Unspecified 07/06/2018, 08/16/2018, 12/17/2018   PPD Test 01/22/2021   Pneumococcal-Unspecified 04/11/2002, 09/13/2002   Tdap 07/31/2018   Varicella 05/29/2002, 06/06/2006    Physical exam  Vitals:   05/10/24 2328 05/11/24 0458 05/11/24 0822 05/11/24 1234  BP: 137/74 134/83 (!) 141/85 134/84  Pulse: 66 68 66 83  Resp: 18 18 18 18   Temp: 98.7 F (37.1 C) 98.6 F (37 C) 98.7 F (37.1 C) 98.2 F (36.8 C)  TempSrc: Oral Oral Oral Oral  SpO2:  100% 100% 100%  Weight:      Height:       General: alert, cooperative, and no distress Lochia: appropriate Uterine Fundus: firm Incision: Healing well with no significant drainage, Dressing is clean, dry, and intact DVT Evaluation: No evidence of DVT seen on physical exam. Labs: Lab Results  Component Value Date   WBC 18.6 (H) 05/09/2024   HGB 12.9 05/09/2024   HCT 37.5 05/09/2024   MCV 88.2 05/09/2024   PLT 211 05/09/2024      Latest Ref Rng & Units 05/07/2024    2:23 PM  CMP  Glucose 70 - 99 mg/dL 81   BUN 6 - 20 mg/dL 10   Creatinine 9.55 - 1.00 mg/dL 9.63   Sodium 864 - 854 mmol/L 138   Potassium 3.5 - 5.1 mmol/L 4.1   Chloride 98 - 111 mmol/L  108   CO2 22 - 32 mmol/L 19   Calcium  8.9 - 10.3 mg/dL 8.8   Total Protein 6.5 - 8.1 g/dL 6.1   Total Bilirubin 0.0 - 1.2 mg/dL 0.5   Alkaline Phos 38 - 126 U/L 194   AST 15 - 41 U/L 21   ALT 0 - 44 U/L 13    Edinburgh Score:    05/09/2024   10:58 PM  Edinburgh Postnatal Depression Scale Screening Tool  I have been able to laugh and see the funny side of things. 0  I have looked forward with enjoyment to things. 1  I have blamed myself unnecessarily when things went wrong. 0  I have been anxious or worried for no good reason. 2  I have felt scared or panicky for no good reason. 1  Things have been getting on top  of me. 1  I have been so unhappy that I have had difficulty sleeping. 1  I have felt sad or miserable. 0  I have been so unhappy that I have been crying. 1  The thought of harming myself has occurred to me. 0  Edinburgh Postnatal Depression Scale Total 7      After visit meds:  Allergies as of 05/11/2024   No Known Allergies      Medication List     STOP taking these medications    VITAFUSION PRENATAL PO       TAKE these medications    furosemide  20 MG tablet Commonly known as: LASIX  Take 1 tablet (20 mg total) by mouth daily for 4 days.   ibuprofen  600 MG tablet Commonly known as: ADVIL  Take 1 tablet (600 mg total) by mouth every 6 (six) hours.   ondansetron  4 MG disintegrating tablet Commonly known as: ZOFRAN -ODT Take 1 tablet (4 mg total) by mouth every 8 (eight) hours as needed for nausea or vomiting.   oxyCODONE  5 MG immediate release tablet Commonly known as: Oxy IR/ROXICODONE  Take 1 tablet (5 mg total) by mouth every 6 (six) hours as needed for severe pain (pain score 7-10).   potassium chloride  SA 20 MEQ tablet Commonly known as: KLOR-CON  M Take 1 tablet (20 mEq total) by mouth daily.   prenatal multivitamin Tabs tablet Take 1 tablet by mouth daily at 12 noon.   senna-docusate 8.6-50 MG tablet Commonly known as: Senokot-S Take 2 tablets by mouth at bedtime.   valACYclovir  500 MG tablet Commonly known as: VALTREX  Take 1 tablet (500 mg total) by mouth 2 (two) times daily. During flare ups.         Discharge home in stable condition Infant Feeding: Bottle and Breast Infant Disposition:home with mother Discharge instruction: per After Visit Summary and Postpartum booklet. Activity: Advance as tolerated. Pelvic rest for 6 weeks.  Diet: routine diet Anticipated Birth Control: Nexplanon and Unsure Postpartum Appointment:Incision check in one week, office visit in 6 weeks Additional Postpartum F/U: BP check 2-3 days Future Appointments: Future  Appointments  Date Time Provider Department Center  06/06/2024  1:40 PM Liana Fish, NP [facility] None   Follow up Visit:  Follow-up Information     Sebastian Sham, CNM. Schedule an appointment as soon as possible for a visit.   Specialties: Certified Nurse Midwife, Radiology Why: BP check on Monday or Tuesday Incision check in one week Office visit in 6 weeks Contact information: [address] [place] [address] [phone]  05/11/2024 Eleanor CHRISTELLA Canny, CNM

## 2024-05-09 NOTE — Anesthesia Postprocedure Evaluation (Signed)
 Anesthesia Post Note  Patient: Madison Boyer  Procedure(s) Performed: CESAREAN DELIVERY  Patient location during evaluation: Mother Baby Anesthesia Type: Epidural Level of consciousness: awake and alert Pain management: pain level controlled Vital Signs Assessment: post-procedure vital signs reviewed and stable Respiratory status: spontaneous breathing, nonlabored ventilation and respiratory function stable Cardiovascular status: stable Postop Assessment: no headache, no backache, epidural receding, able to ambulate, no apparent nausea or vomiting, patient able to bend at knees and adequate PO intake Anesthetic complications: no   No notable events documented.   Last Vitals:  Vitals:   05/09/24 0605 05/09/24 0700  BP: 134/86 127/80  Pulse: 86 68  Resp:  16  Temp:  36.7 C  SpO2: 97% 100%    Last Pain:  Vitals:   05/09/24 0710  TempSrc:   PainSc: 0-No pain                 [name]

## 2024-05-09 NOTE — Progress Notes (Signed)
 Patient ID: Madison Boyer, female   DOB: 10-28-2000, 23 y.o.   MRN: [id number]    Progress Note - Cesarean Delivery  Madison Boyer is a 23 y.o. G1P0101 now PP day 1 s/p C-Section, Low Transverse.   Subjective:  Patient reports no problems with eating, bowel movements, voiding, or their wound  Pain controlled  Has been OOB and voiding without problem  Objective:  Vital signs in last 24 hours: Temp:  [97.4 F (36.3 C)-98.1 F (36.7 C)] 98.1 F (36.7 C) (08/07 0700) Pulse Rate:  [66-118] 68 (08/07 0700) Resp:  [8-24] 16 (08/07 0700) BP: (107-169)/(62-98) 127/80 (08/07 0700) SpO2:  [94 %-100 %] 100 % (08/07 0700)  Physical Exam:  General: alert, cooperative, appears stated age, and no distress Lochia: appropriate Uterine Fundus: firm Incision: dressing in place    Data Review Recent Labs    05/08/24 1424 05/09/24 0458  HGB 14.3 12.9  HCT 40.0 37.5    Assessment:  Principal Problem:   Preeclampsia, severe, third trimester Active Problems:   Bicornuate uterus     Overview: Needs MFM anatomy US    Susceptible to varicella (non-immune), currently pregnant   Rubella non-immune status, antepartum   Status post Cesarean section. Doing well postoperatively.   Pre-eclampsia - on Mg - BP's good  Plan:       Continue current care.  D/C Mg late tonight.    Alm DOROTHA Sar, M.D. 05/09/2024 8:09 AM

## 2024-05-09 NOTE — Plan of Care (Signed)
  Problem: Education: Goal: Knowledge of General Education information will improve Description: Including pain rating scale, medication(s)/side effects and non-pharmacologic comfort measures Outcome: Progressing   Problem: Health Behavior/Discharge Planning: Goal: Ability to manage health-related needs will improve Outcome: Progressing   Problem: Clinical Measurements: Goal: Ability to maintain clinical measurements within normal limits will improve Outcome: Progressing Goal: Will remain free from infection Outcome: Progressing Goal: Diagnostic test results will improve Outcome: Progressing Goal: Respiratory complications will improve Outcome: Progressing Goal: Cardiovascular complication will be avoided Outcome: Progressing   Problem: Activity: Goal: Risk for activity intolerance will decrease Outcome: Progressing   Problem: Nutrition: Goal: Adequate nutrition will be maintained Outcome: Progressing   Problem: Coping: Goal: Level of anxiety will decrease Outcome: Progressing   Problem: Elimination: Goal: Will not experience complications related to bowel motility Outcome: Progressing Goal: Will not experience complications related to urinary retention Outcome: Progressing   Problem: Pain Managment: Goal: General experience of comfort will improve and/or be controlled Outcome: Progressing   Problem: Safety: Goal: Ability to remain free from injury will improve Outcome: Progressing   Problem: Skin Integrity: Goal: Risk for impaired skin integrity will decrease Outcome: Progressing   Problem: Education: Goal: Knowledge of disease or condition will improve Outcome: Progressing Goal: Knowledge of the prescribed therapeutic regimen will improve Outcome: Progressing   Problem: Fluid Volume: Goal: Peripheral tissue perfusion will improve Outcome: Progressing   Problem: Clinical Measurements: Goal: Complications related to disease process, condition or treatment  will be avoided or minimized Outcome: Progressing   Problem: Education: Goal: Knowledge of the prescribed therapeutic regimen will improve Outcome: Progressing Goal: Understanding of sexual limitations or changes related to disease process or condition will improve Outcome: Progressing Goal: Individualized Educational Video(s) Outcome: Progressing   Problem: Self-Concept: Goal: Communication of feelings regarding changes in body function or appearance will improve Outcome: Progressing   Problem: Skin Integrity: Goal: Demonstration of wound healing without infection will improve Outcome: Progressing   Problem: Education: Goal: Knowledge of condition will improve Outcome: Progressing Goal: Individualized Educational Video(s) Outcome: Progressing Goal: Individualized Newborn Educational Video(s) Outcome: Progressing   Problem: Activity: Goal: Will verbalize the importance of balancing activity with adequate rest periods Outcome: Progressing Goal: Ability to tolerate increased activity will improve Outcome: Progressing   Problem: Coping: Goal: Ability to identify and utilize available resources and services will improve Outcome: Progressing   Problem: Life Cycle: Goal: Chance of risk for complications during the postpartum period will decrease Outcome: Progressing   Problem: Role Relationship: Goal: Ability to demonstrate positive interaction with newborn will improve Outcome: Progressing   Problem: Skin Integrity: Goal: Demonstration of wound healing without infection will improve Outcome: Progressing

## 2024-05-10 LAB — CULTURE, BETA STREP (GROUP B ONLY): Strep Gp B Culture: POSITIVE — AB

## 2024-05-10 NOTE — Lactation Note (Signed)
 This note was copied from a baby's chart. Lactation Consultation Note  Patient Name: Madison Boyer Date: 05/10/2024 Age:23 hours Reason for consult: Follow-up assessment;1st time breastfeeding;Late-preterm 34-36.6wks   Maternal Data Has patient been taught Hand Expression?: Yes MOB following care plan for 52 week old infant, reports concerns over no visible copious milk supply. Currently utilizing hospital DEBP and hand pump to express colostrum with no droplets visible upon expression.  Feeding Mother's Current Feeding Choice: Breast Milk and Formula Nipple Type: Slow - flow DEBP assembly by LC provided as well as milk storage guidelines and cleaning recommendations. MOB sized at 18mm flange fit provided with DEBP kit, with LC anticipatory guidance given regarding breast changes and hormone fluctuation that may impact fit over time and with various vacuum settings.Discussed limiting pumping sessions to 15 maximum duration interval when supplmentaton of bottle feeding occurs, and to hand pump as needed or to improve comfort from non-nursing breast or following feeds should fullness persists. Emphasized supply and demand process of maternal milk maturation and production, and advised MOB to remain aware of signs of worsening engorgement. Demo finger feed with expressed colostrum, MOB production sufficient to replicate with finger and swab upon end of pumping session. Lactation Tools Discussed/Used Tools: Pump;Flanges;Medicine Dropper Flange Size: 18 Breast pump type: Double-Electric Breast Pump Pump Education: Setup, frequency, and cleaning;Milk Storage Reason for Pumping: Maternal milk maturation Pumping frequency: Every 3 hours per care plan for GA Pumped volume: 2 mL  Interventions Interventions: Breast feeding basics reviewed;Expressed milk;DEBP;Hand pump;Education;Guidelines for Milk Supply and Pumping Schedule Handout;CDC Guidelines for Breast Pump Cleaning;CDC milk  storage guidelines Encouraged offering breast as often as possible and maintaining skin-to-skin contact to support feeding cues and bonding. Advised expressing milk as needed and monitoring diaper output to confirm adequate intake. Recommended no more than 3 hours between feeds as 67 week old infants may require tailored supervision to prevent excessive weight loss or risk of blood sugar/bilirubin complications. MOB verbalized understanding and will notify pediatric provider with any changes or concerns. Encouraged contacting outpatient lactation support with any questions regarding breast condition, infant feeding, or adjustments to current feeding plan. Discharge Discharge Education: Engorgement and breast care;Warning signs for feeding baby;Outpatient recommendation CDC milk storage guidelines provided during consult. MOB demonstrated understanding of pump use, milk collection, and cleaning of parts coming in direct contact with milk. Encouraged consistent pumping to establish and maintain supply following feeds at breast and skin to skin. Plan to reassess pumping technique and output collected overnight at follow-up. Consult Status Consult Status: Follow-up Date: 05/11/24 Follow-up type: In-patient    Madison Boyer 05/10/2024, 11:42 PM

## 2024-05-10 NOTE — Lactation Note (Signed)
 This note was copied from a baby's chart. Lactation Consultation Note  Patient Name: Madison Boyer Date: 05/10/2024 Age:23 hours Reason for consult: Initial assessment;Primapara;1st time breastfeeding;Late-preterm 34-36.6wks  G1P1 first time breastfeeding. Infant GA at 36 weeks at birth with current feeding plan of: offering the breast at the onset of hunger cues, with feeds limited to a maximum of 30 minutes. If no cues are observed, breastfeeding is offered every 3 hours. Following each breastfeeding session, patient will pump using a hospital-grade DEBP pump for 15 minutes every 3 hours. Supplementation will be provided as needed, with volume guided by day of life (DOL) norms. Preference of supplement is currently formula until maternal milk supply established via frequent stimulation and removal of colostrum/milk.  Maternal Data Does the patient have breastfeeding experience prior to this delivery?: No MOB states she has primarily been feeding at breast followed by formula, as no droplets are being elicited by utilization of breast pump. She denies any pain or fullness developing in her breast tissue at this time, and is unfamiliar with hand expression as an alternative method of milk removal.  Feeding Mother's Current Feeding Choice: Breast Milk and Formula Nipple Type: Slow - flow LC to return to bedside for feeding assessment, as infant not presently awake and feeding schedule not due. Next feed to occur at 1630, 1930, 2230 today. Follow-up for full observation tentative for 1930, with [facility] name and number updated on board in the event any changes occur prior to next scheduled feed. Lactation Tools Discussed/Used  Discussed pumping frequency per infant cues or every 3 hours and of fifteen minute durational sessions to support lactogenesis and ensure adequate milk for infant supplementation. CDC milk storage guidelines provided during consult. MOB demonstrated understanding of  pump use and storage guidelines. Encouraged consistent pumping to establish and maintain supply. Plan to reassess pumping technique and output during follow-up.Discussed pumping frequency per infant cues or every 3 hours and of fifteen minute durational sessions to support lactogenesis and ensure adequate milk for infant supplementation. CDC milk storage guidelines provided during consult. MOB demonstrated understanding of pump use and storage guidelines. Encouraged consistent pumping to establish and maintain supply.   Interventions Interventions: Breast feeding basics reviewed;Breast massage;Skin to skin;DEBP;Education;CDC milk storage guidelines;LC Services brochure;Guidelines for Milk Supply and Pumping Schedule Handout;CDC Guidelines for Breast Pump Cleaning Plan to reassess infant feeding attempt and pumping technique/output during follow-up. Hand pump and discharge education presented at time of present consult. Assured MOB that maternal milk maturation process is not immediate and that previous attempts of milk removal demonstrating no output is within normative expectations following hours postpartum in addition to surgical recovery. Discharge Discharge Education: Engorgement and breast care;Warning signs for feeding baby;Outpatient recommendation Pump: Manual Reviewed care plan prior to [facility] exit, MOB expressed understanding and denies additional questions or concerns at this time.  Consult Status Consult Status: Follow-up    Madison Boyer 05/10/2024, 6:08 PM

## 2024-05-10 NOTE — Progress Notes (Signed)
 Progress Note - Cesarean Delivery  Madison Boyer is a 23 y.o. G1P0101 now PP day 2 s/p C-Section, Low Transverse.   Subjective:  Patient reports no problems with eating, bowel movements, voiding, or their wound    Objective:  Vital signs in last 24 hours: Temp:  [97.7 F (36.5 C)-98.9 F (37.2 C)] 98.4 F (36.9 C) (08/08 0822) Pulse Rate:  [57-78] 61 (08/08 0822) Resp:  [16-18] 18 (08/08 0822) BP: (103-135)/(52-86) 125/82 (08/08 0822) SpO2:  [92 %-100 %] 100 % (08/08 9177)  Physical Exam:  General: alert, cooperative, appears stated age, and no distress Lochia: appropriate Uterine Fundus: firm Incision: no significant drainage    Data Review Recent Labs    05/08/24 1424 05/09/24 0458  HGB 14.3 12.9  HCT 40.0 37.5    Assessment:  Principal Problem:   Preeclampsia, severe, third trimester Active Problems:   Bicornuate uterus     Overview: Needs MFM anatomy US    Susceptible to varicella (non-immune), currently pregnant   Rubella non-immune status, antepartum   Status post Cesarean section. Doing well postoperatively.     Plan:       Continue current care. Plan for discharge tomorrow due to baby not being discharged today.     Zelda Hummer, CNM  05/10/2024 9:21 AM

## 2024-05-11 ENCOUNTER — Encounter: Payer: Self-pay | Admitting: Obstetrics & Gynecology

## 2024-05-11 DIAGNOSIS — O99820 Streptococcus B carrier state complicating pregnancy: Secondary | ICD-10-CM | POA: Insufficient documentation

## 2024-05-11 MED ORDER — TETANUS-DIPHTH-ACELL PERTUSSIS 5-2.5-18.5 LF-MCG/0.5 IM SUSY: 0.5000 mL | PREFILLED_SYRINGE | Freq: Once | INTRAMUSCULAR | Status: DC

## 2024-05-11 MED ORDER — MEASLES, MUMPS & RUBELLA VAC IJ SOLR
0.5000 mL | Freq: Once | INTRAMUSCULAR | Status: AC
  Administered 2024-05-11: 0.5 mL via SUBCUTANEOUS
  Filled 2024-05-11 (×2): qty 0.5

## 2024-05-11 MED ORDER — IBUPROFEN 600 MG PO TABS: 600.0000 mg | ORAL_TABLET | Freq: Four times a day (QID) | ORAL | 0 refills | Status: DC

## 2024-05-11 MED ORDER — OXYCODONE HCL 5 MG PO TABS: 5.0000 mg | ORAL_TABLET | Freq: Four times a day (QID) | ORAL | 0 refills | Status: DC | PRN

## 2024-05-11 MED ORDER — SENNOSIDES-DOCUSATE SODIUM 8.6-50 MG PO TABS
2.0000 | ORAL_TABLET | Freq: Every day | ORAL | 0 refills | Status: DC
Start: 2024-05-11 — End: 2024-06-20

## 2024-05-11 MED ORDER — FUROSEMIDE 20 MG PO TABS
20.0000 mg | ORAL_TABLET | Freq: Every day | ORAL | 0 refills | Status: AC
Start: 2024-05-11 — End: 2024-05-15

## 2024-05-11 MED ORDER — VARICELLA VIRUS VACCINE LIVE 1350 PFU/0.5ML IJ SUSR
0.5000 mL | Freq: Once | INTRAMUSCULAR | Status: AC
  Administered 2024-05-11: 0.5 mL via SUBCUTANEOUS
  Filled 2024-05-11: qty 0.5

## 2024-05-11 MED ORDER — FUROSEMIDE 20 MG PO TABS
20.0000 mg | ORAL_TABLET | Freq: Every day | ORAL | Status: DC
  Administered 2024-05-11: 20 mg via ORAL
  Filled 2024-05-11: qty 1

## 2024-05-11 MED ORDER — POTASSIUM CHLORIDE CRYS ER 20 MEQ PO TBCR: 20.0000 meq | EXTENDED_RELEASE_TABLET | Freq: Every day | ORAL | 0 refills | Status: DC

## 2024-05-11 MED ORDER — PRENATAL MULTIVITAMIN CH: 1.0000 | ORAL_TABLET | Freq: Every day | ORAL | 3 refills | Status: DC

## 2024-05-11 MED ORDER — POTASSIUM CHLORIDE CRYS ER 20 MEQ PO TBCR
20.0000 meq | EXTENDED_RELEASE_TABLET | Freq: Every day | ORAL | Status: DC
  Administered 2024-05-11: 20 meq via ORAL
  Filled 2024-05-11: qty 1

## 2024-05-11 NOTE — Progress Notes (Signed)
 Pt discharged with infant.  Discharge instructions, prescriptions and follow up appointment given to and reviewed with pt. Pt verbalized understanding. Escorted out by auxillary.

## 2024-05-11 NOTE — Final Progress Note (Signed)
 Subjective: Postpartum Day 3: Cesarean Delivery [facility] is feeling well overall. She is ambulating, voiding, and tolerating POs without difficulty. Her pain is well-controlled and her bleeding is WNL. Her mood is stable. She is attempting to put the baby to the breast and is pumping. She denies HA, visual changes, and RUQ pain. She has had a few MRBPs over the past 24 hours   Objective: Vital signs in last 24 hours: Temp:  [98.1 F (36.7 C)-98.7 F (37.1 C)] 98.7 F (37.1 C) (08/09 0822) Pulse Rate:  [64-76] 66 (08/09 0822) Resp:  [16-20] 18 (08/09 0822) BP: (134-154)/(74-85) 141/85 (08/09 0822) SpO2:  [100 %] 100 % (08/09 9177)  Physical Exam:  General: alert, cooperative, and appears stated age 16: appropriate Uterine Fundus: firm Incision: healing well, honeycomb dressing C/D/I +2 pitting edema in BLE DVT Evaluation: No evidence of DVT seen on physical exam.  Recent Labs    05/08/24 1424 05/09/24 0458  HGB 14.3 12.9  HCT 40.0 37.5    Assessment/Plan: Status post Cesarean section. Doing well postoperatively.  Will start a 5-day course of Lasix  Discharge home this afternoon if BPs are normal or mild range and no severe features  Madison Boyer, CNM 05/11/2024, 10:29 AM

## 2024-05-11 NOTE — Lactation Note (Addendum)
 This note was copied from a baby's chart. Lactation Consultation Note  Patient Name: Madison Boyer Unijb'd Date: 05/11/2024 Age:23 hours Reason for consult: Follow-up assessment   Maternal Data Has patient been taught Hand Expression?: Yes Does the patient have breastfeeding experience prior to this delivery?: No  Feeding Mother's Current Feeding Choice: Breast Milk and Formula Nipple Type: Slow - flow Assisted mom with latching baby to right breast in cradle hold, baby rooting and fussy, hand expression of drops of colostrum, after few attempts, baby able to coordinate to latch and  suck a few times, then tired and held nipple in mouth but still maintained latch, was able to stimulate baby to nurse approx 3 minutes, no swallowing heard, mom then formula fed baby 25 cc Sim 22 cal Neosure, encouraged mom to pump breasts after as she has not pumped today.  Encouraged mom to pump breasts at least 8x/24 hrs to establish her supply, keep attempting breastfeeding but work on milk supply most, until baby is mature enough to be efficient at breastfeeding.       LATCH Score Latch: Repeated attempts needed to sustain latch, nipple held in mouth throughout feeding, stimulation needed to elicit sucking reflex.  Audible Swallowing: None  Type of Nipple: Everted at rest and after stimulation  Comfort (Breast/Nipple): Filling, red/small blisters or bruises, mild/mod discomfort  Hold (Positioning): Assistance needed to correctly position infant at breast and maintain latch.  LATCH Score: 5   Lactation Tools Discussed/Used Breast pump type: Double-Electric Breast Pump Reason for Pumping: to supplement baby and encourage milk producton Pumping frequency: encouraged to pump q3h or 8x/24 hrs  Interventions Interventions: Pace feeding;DEBP Lactation name updated on white board Discharge Discharge Education: Engorgement and breast care;Warning signs for feeding baby Pump: DEBP WIC  Program: Yes Contact WIC on Monday, 8/11.  Consult Status Consult Status: PRN Date: 05/11/24 Follow-up type: In-patient    Aldona JONETTA Converse 05/11/2024, 11:26 AM

## 2024-05-14 ENCOUNTER — Ambulatory Visit

## 2024-05-15 ENCOUNTER — Encounter: Payer: Self-pay | Admitting: Obstetrics and Gynecology

## 2024-05-15 ENCOUNTER — Ambulatory Visit: Admitting: Obstetrics and Gynecology

## 2024-05-15 VITALS — BP 134/88 | HR 85 | Ht 62.0 in | Wt 207.7 lb

## 2024-05-15 DIAGNOSIS — Z9889 Other specified postprocedural states: Secondary | ICD-10-CM | POA: Diagnosis not present

## 2024-05-15 DIAGNOSIS — Z0289 Encounter for other administrative examinations: Secondary | ICD-10-CM

## 2024-05-15 NOTE — Progress Notes (Signed)
 Patient presents today for 1 week postpartum follow-up. Patient had a cesarean delivery on 05/08/24. She is breast feeding with no complaints or issues at this time. She states she would like POP's for birth control. EPDS score of  4. She states no other questions or concerns at this time.

## 2024-05-15 NOTE — Progress Notes (Signed)
 HPI:      Ms. Madison Boyer is a 23 y.o. G1P0101 who LMP was No LMP recorded.  Subjective:   She presents today 1 week postop from cesarean delivery.  She has a known bicornuate uterus.  She also had preeclampsia.  She is not currently taking any antihypertensives.  She has not completed her course of Lasix .  She says that she is ambulating voiding and eating without difficulty.  She has had no problems with bowel movements. She is having minimal pain from her incision.    Hx: The following portions of the patient's history were reviewed and updated as appropriate:             She  has a past medical history of Bipolar depression (HCC) and Schizophrenic disorder (HCC) (2021). She does not have any pertinent problems on file. She  has a past surgical history that includes Appendectomy and Cesarean section (N/A, 05/08/2024). Her family history includes Depression in her maternal grandfather; Diabetes in her maternal grandfather; Healthy in her father, maternal grandmother, mother, paternal grandfather, and paternal grandmother. She  reports that she has quit smoking. Her smoking use included e-cigarettes. She has never used smokeless tobacco. She reports that she does not currently use alcohol. She reports that she does not currently use drugs after having used the following drugs: Marijuana. She has a current medication list which includes the following prescription(s): furosemide , ibuprofen , ondansetron , potassium chloride  sa, prenatal multivitamin, senna-docusate, oxycodone , and valacyclovir . She has no known allergies.       Review of Systems:  Review of Systems  Constitutional: Denied constitutional symptoms, night sweats, recent illness, fatigue, fever, insomnia and weight loss.  Eyes: Denied eye symptoms, eye pain, photophobia, vision change and visual disturbance.  Ears/Nose/Throat/Neck: Denied ear, nose, throat or neck symptoms, hearing loss, nasal discharge, sinus congestion and  sore throat.  Cardiovascular: Denied cardiovascular symptoms, arrhythmia, chest pain/pressure, edema, exercise intolerance, orthopnea and palpitations.  Respiratory: Denied pulmonary symptoms, asthma, pleuritic pain, productive sputum, cough, dyspnea and wheezing.  Gastrointestinal: Denied, gastro-esophageal reflux, melena, nausea and vomiting.  Genitourinary: Denied genitourinary symptoms including symptomatic vaginal discharge, pelvic relaxation issues, and urinary complaints.  Musculoskeletal: Denied musculoskeletal symptoms, stiffness, swelling, muscle weakness and myalgia.  Dermatologic: Denied dermatology symptoms, rash and scar.  Neurologic: Denied neurology symptoms, dizziness, headache, neck pain and syncope.  Psychiatric: Denied psychiatric symptoms, anxiety and depression.  Endocrine: Denied endocrine symptoms including hot flashes and night sweats.   Meds:   Current Outpatient Medications on File Prior to Visit  Medication Sig Dispense Refill   furosemide  (LASIX ) 20 MG tablet Take 1 tablet (20 mg total) by mouth daily for 4 days. 4 tablet 0   ibuprofen  (ADVIL ) 600 MG tablet Take 1 tablet (600 mg total) by mouth every 6 (six) hours. 30 tablet 0   ondansetron  (ZOFRAN -ODT) 4 MG disintegrating tablet Take 1 tablet (4 mg total) by mouth every 8 (eight) hours as needed for nausea or vomiting. 20 tablet 0   potassium chloride  SA (KLOR-CON  M) 20 MEQ tablet Take 1 tablet (20 mEq total) by mouth daily. 4 tablet 0   Prenatal Vit-Fe Fumarate-FA (PRENATAL MULTIVITAMIN) TABS tablet Take 1 tablet by mouth daily at 12 noon. 90 tablet 3   senna-docusate (SENOKOT-S) 8.6-50 MG tablet Take 2 tablets by mouth at bedtime. 30 tablet 0   oxyCODONE  (OXY IR/ROXICODONE ) 5 MG immediate release tablet Take 1 tablet (5 mg total) by mouth every 6 (six) hours as needed for severe pain (pain score  7-10). (Patient not taking: Reported on 05/15/2024) 20 tablet 0   valACYclovir  (VALTREX ) 500 MG tablet Take 1 tablet  (500 mg total) by mouth 2 (two) times daily. During flare ups. (Patient not taking: Reported on 04/12/2024) 60 tablet 2   No current facility-administered medications on file prior to visit.      Objective:     Vitals:   05/15/24 0916 05/15/24 0927  BP: (!) 142/88 134/88  Pulse: 85    Filed Weights   05/15/24 0916  Weight: 207 lb 11.2 oz (94.2 kg)               Abdomen: Soft.  Non-tender.  No masses.  No HSM.  Incision/s: Intact.  Healing well.  No erythema.  No drainage.   Dressing removed          Assessment:    G1P0101 Patient Active Problem List   Diagnosis Date Noted   GBS (group B Streptococcus carrier), +RV culture, currently pregnant 05/11/2024   Preeclampsia, severe, third trimester 04/23/2024   Labor and delivery, indication for care 04/15/2024   Short cervix affecting pregnancy 01/10/2024   Susceptible to varicella (non-immune), currently pregnant 12/08/2023   Rubella non-immune status, antepartum 12/08/2023   Bicornuate uterus 12/04/2023   Obesity affecting pregnancy 12/04/2023   Supervision of normal pregnancy 10/30/2023   Overdose of antidepressant 03/22/2022   Cannabis abuse 03/25/2020   Schizoaffective disorder (HCC) 02/15/2020     1. Postoperative state   2. Postpartum care following cesarean delivery     Excellent recovery   Plan:            1.  Follow-up in 5 weeks.  Wound care discussed. Orders No orders of the defined types were placed in this encounter.   No orders of the defined types were placed in this encounter.     F/U  Return in about 5 weeks (around 06/19/2024).  Alm DOROTHA Sar, M.D. 05/15/2024 10:03 AM

## 2024-05-16 ENCOUNTER — Telehealth: Payer: Self-pay

## 2024-05-16 NOTE — Telephone Encounter (Signed)
 I called Madison Boyer and left voicemail about her FMLA forms.

## 2024-05-31 ENCOUNTER — Telehealth: Payer: Self-pay | Admitting: Nurse Practitioner

## 2024-05-31 NOTE — Telephone Encounter (Signed)
 Left vm and sent mychart message to confirm 06/06/24 appointment-Toni

## 2024-06-06 ENCOUNTER — Encounter (INDEPENDENT_AMBULATORY_CARE_PROVIDER_SITE_OTHER): Payer: MEDICAID | Admitting: Nurse Practitioner

## 2024-06-06 ENCOUNTER — Encounter: Payer: Self-pay | Admitting: Nurse Practitioner

## 2024-06-12 ENCOUNTER — Ambulatory Visit
Admission: EM | Admit: 2024-06-12 | Discharge: 2024-06-12 | Disposition: A | Discharge status: Home / Self Care | Attending: Family Medicine | Admitting: Family Medicine

## 2024-06-12 ENCOUNTER — Telehealth: Payer: Self-pay

## 2024-06-12 DIAGNOSIS — N61 Mastitis without abscess: Secondary | ICD-10-CM | POA: Diagnosis not present

## 2024-06-12 MED ORDER — DICLOXACILLIN SODIUM 500 MG PO CAPS: 500.0000 mg | ORAL_CAPSULE | Freq: Four times a day (QID) | ORAL | 0 refills | Status: AC

## 2024-06-12 NOTE — Telephone Encounter (Signed)
 Patient currently at urgent care for evaluation.

## 2024-06-12 NOTE — Discharge Instructions (Addendum)
 See handout on breast feeding and mastitis.   Stop by the pharmacy to pick up your prescriptions.  Follow up with your primary care provider or return to the urgent care, if not improving.

## 2024-06-12 NOTE — ED Triage Notes (Signed)
 Pt c/o L breast pain,fever,chills & HA x2 days. Currently lactating. Has tried tylenol  w/o relief.

## 2024-06-12 NOTE — ED Provider Notes (Signed)
 [facility]    CSN: [id number] Arrival date & time: 06/12/24  1545      History   Chief Complaint Chief Complaint  Patient presents with   Headache   Chills   Fever    HPI Dashawn Golda is a 23 y.o. female.   HPI  History obtained from the patient. Carlea presents for fever, left breast pain, chills and intermittent headache and hot flashes for the past 2 days. She has pain in her C-section scar. She delivered on 05/08/24.  She is breast feeding. Took Tylenol  around 12 PM.   Denies cough, rhinorrhea, belly pain, vomiting, diarrhea, or sore throat.      Past Medical History:  Diagnosis Date   Bipolar depression (HCC)    Schizophrenic disorder (HCC) 2021    Patient Active Problem List   Diagnosis Date Noted   GBS (group B Streptococcus carrier), +RV culture, currently pregnant 05/11/2024   Preeclampsia, severe, third trimester 04/23/2024   Labor and delivery, indication for care 04/15/2024   Short cervix affecting pregnancy 01/10/2024   Susceptible to varicella (non-immune), currently pregnant 12/08/2023   Rubella non-immune status, antepartum 12/08/2023   Bicornuate uterus 12/04/2023   Obesity affecting pregnancy 12/04/2023   Supervision of normal pregnancy 10/30/2023   Overdose of antidepressant 03/22/2022   Cannabis abuse 03/25/2020   Schizoaffective disorder (HCC) 02/15/2020    Past Surgical History:  Procedure Laterality Date   APPENDECTOMY     CESAREAN SECTION N/A 05/08/2024   Procedure: CESAREAN DELIVERY;  Surgeon: Janit Alm Agent, MD;  Location: [facility];  Service: Obstetrics;  Laterality: N/A;    OB History     Gravida  1   Para  1   Term  0   Preterm  1   AB  0   Living  1      SAB  0   IAB  0   Ectopic  0   Multiple  0   Live Births  1            Home Medications    Prior to Admission medications   Medication Sig Start Date End Date Taking? Authorizing Provider  dicloxacillin  (DYNAPEN ) 500 MG  capsule Take 1 capsule (500 mg total) by mouth 4 (four) times daily for 7 days. 06/12/24 06/19/24 Yes Elster Corbello, DO  furosemide  (LASIX ) 20 MG tablet Take 1 tablet (20 mg total) by mouth daily for 4 days. 05/11/24 05/15/24  Justino Eleanor HERO, CNM  ibuprofen  (ADVIL ) 600 MG tablet Take 1 tablet (600 mg total) by mouth every 6 (six) hours. 05/11/24   Justino Eleanor HERO, CNM  ondansetron  (ZOFRAN -ODT) 4 MG disintegrating tablet Take 1 tablet (4 mg total) by mouth every 8 (eight) hours as needed for nausea or vomiting. 04/15/24   Sebastian Sham, CNM  oxyCODONE  (OXY IR/ROXICODONE ) 5 MG immediate release tablet Take 1 tablet (5 mg total) by mouth every 6 (six) hours as needed for severe pain (pain score 7-10). Patient not taking: Reported on 05/15/2024 05/11/24   Justino Eleanor HERO, CNM  potassium chloride  SA (KLOR-CON  M) 20 MEQ tablet Take 1 tablet (20 mEq total) by mouth daily. 05/11/24   Justino Eleanor HERO, CNM  Prenatal Vit-Fe Fumarate-FA (PRENATAL MULTIVITAMIN) TABS tablet Take 1 tablet by mouth daily at 12 noon. 05/11/24   Justino Eleanor HERO, CNM  senna-docusate (SENOKOT-S) 8.6-50 MG tablet Take 2 tablets by mouth at bedtime. 05/11/24   Justino Eleanor HERO, CNM  valACYclovir  (VALTREX ) 500 MG tablet Take  1 tablet (500 mg total) by mouth 2 (two) times daily. During flare ups. Patient not taking: Reported on 04/12/2024 06/01/23   Liana Fish, NP    Family History Family History  Problem Relation Age of Onset   Healthy Mother    Healthy Father    Healthy Maternal Grandmother    Depression Maternal Grandfather    Diabetes Maternal Grandfather    Healthy Paternal Grandmother    Healthy Paternal Grandfather     Social History Social History   Tobacco Use   Smoking status: Former    Types: E-cigarettes   Smokeless tobacco: Never   Tobacco comments:    Stop vaping  Vaping Use   Vaping status: Former  Substance Use Topics   Alcohol use: Not Currently    Comment: occassionally   Drug use: Not  Currently    Types: Marijuana     Allergies   Patient has no known allergies.   Review of Systems Review of Systems: negative unless otherwise stated in HPI.      Physical Exam Triage Vital Signs ED Triage Vitals  Encounter Vitals Group     BP 06/12/24 1646 110/69     Girls Systolic BP Percentile --      Girls Diastolic BP Percentile --      Boys Systolic BP Percentile --      Boys Diastolic BP Percentile --      Pulse Rate 06/12/24 1646 72     Resp 06/12/24 1646 16     Temp 06/12/24 1646 98.8 F (37.1 C)     Temp Source 06/12/24 1646 Oral     SpO2 06/12/24 1646 97 %     Weight 06/12/24 1645 200 lb 6.4 oz (90.9 kg)     Height 06/12/24 1645 5' 2 (1.575 m)     Head Circumference --      Peak Flow --      Pain Score 06/12/24 1648 7     Pain Loc --      Pain Education --      Exclude from Growth Chart --    No data found.  Updated Vital Signs BP 110/69 (BP Location: Left Arm)   Pulse 72   Temp 98.8 F (37.1 C) (Oral)   Resp 16   Ht 5' 2 (1.575 m)   Wt 90.9 kg   SpO2 97%   Breastfeeding Yes   BMI 36.65 kg/m   Visual Acuity Right Eye Distance:   Left Eye Distance:   Bilateral Distance:    Right Eye Near:   Left Eye Near:    Bilateral Near:     Physical Exam GEN:     alert, non-toxic appearing female in no distress    HENT:  mucus membranes moist, no asal discharge EYES:   no scleral injection or discharge NECK:  normal ROM, no meningismus   RESP:  no increased work of breathing CVS:   regular rate  BREAST:    no suspicious masses, no nipple changes, lactating, no erythema or tenderness, nipples normal, left superior breast erythema with warmth  Skin:  see breast exam above, C-section scar healing well and without warmth, discharge or erythema, steri-strips in-place    UC Treatments / Results  Labs (all labs ordered are listed, but only abnormal results are displayed) Labs Reviewed - No data to display  EKG   Radiology No results  found.   Procedures Procedures (including critical care time)  Medications Ordered in UC Medications -  No data to display  Initial Impression / Assessment and Plan / UC Course  I have reviewed the triage vital signs and the nursing notes.  Pertinent labs & imaging results that were available during my care of the patient were reviewed by me and considered in my medical decision making (see chart for details).       Pt is a 23 y.o. female who presents for 2 days of body aches, fever, headache with left breast pain. Linh is breast feeding her newborn. Mykelti is afebrile here though had recent antipyretics. Satting well on room air. Overall pt is non-toxic appearing, well hydrated, without respiratory distress. Breast exam  is remarkable for TTP and erythema. History and exam concerning for left mastitis.  Breastfeeding with mastitis handout provided with AVS.  Treat with antibiotics as below. Discussed symptomatic treatment.  Typical duration of symptoms discussed.   Return and ED precautions given and voiced understanding. Discussed MDM, treatment plan and plan for follow-up with patient who agrees with plan.     Final Clinical Impressions(s) / UC Diagnoses   Final diagnoses:  Acute mastitis of left breast     Discharge Instructions      See handout on breast feeding and mastitis.   Stop by the pharmacy to pick up your prescriptions.  Follow up with your primary care provider or return to the urgent care, if not improving.       ED Prescriptions     Medication Sig Dispense Auth. Provider   dicloxacillin  (DYNAPEN ) 500 MG capsule Take 1 capsule (500 mg total) by mouth 4 (four) times daily for 7 days. 28 capsule Pau Banh, DO      PDMP not reviewed this encounter.   Rexanne Inocencio, DO 06/12/24 1725

## 2024-06-19 NOTE — Progress Notes (Unsigned)
   OBSTETRICS POSTPARTUM CLINIC PROGRESS NOTE  Subjective:     Madison Boyer is a 23 y.o. G98P0101 female who presents for a postpartum visit. She is 6 weeks postpartum following a low cervical transverse Cesarean section. I have fully reviewed the prenatal and intrapartum course. The delivery was at 36 gestational weeks.  Anesthesia: spinal. Postpartum course has been good. Baby's course has been good. Baby is feeding by both breast and bottle - Similac Neosure. Bleeding: patient has not not resumed menses, with No LMP recorded.. Bowel function is normal. Bladder function is normal. Patient is not sexually active. Contraception method desired is OCP (estrogen/progesterone ). Postpartum depression screening: positive. Mild EDPS score is 6. Declines medication. States she see a Veterinary surgeon , and is planning on seeing them.    The following portions of the patient's history were reviewed and updated as appropriate: allergies, current medications, past family history, past medical history, past social history, past surgical history, and problem list.  Review of Systems A comprehensive review of systems was negative.   Objective:    BP 113/65   Pulse 80   Wt 198 lb 3.2 oz (89.9 kg)   Breastfeeding Yes   BMI 36.25 kg/m   General:  alert and no distress   Breasts:  inspection negative, no nipple discharge or bleeding, no masses or nodularity palpable  Lungs: clear to auscultation bilaterally  Heart:  regular rate and rhythm, S1, S2 normal, no murmur, click, rub or gallop  Abdomen: soft, non-tender; bowel sounds normal; no masses,  no organomegaly.  Well healed Pfannenstiel incision, area has dry flaking skin. Discussed use of A&D ointment to area   Vulva:  normal  Vagina: normal vagina, no discharge, exudate, lesion, or erythema  Cervix:  no cervical motion tenderness and no lesions  Corpus: normal size, contour, position, consistency, mobility, non-tender  Adnexa:  normal adnexa and no  mass, fullness, tenderness  Rectal Exam: Not performed.         Labs:  Lab Results  Component Value Date   HGB 12.9 05/09/2024     Assessment:   1. Postpartum care following cesarean delivery   2. Encounter for other general counseling or advice on contraception      Plan:    1. Contraception: OCP (estrogen/progesterone ) 2. Will check Hgb for h/o postpartum anemia of less than 10.  3. Follow up in: 9 months or as needed.    Zelda Hummer, CNM  [facility]

## 2024-06-20 ENCOUNTER — Ambulatory Visit (INDEPENDENT_AMBULATORY_CARE_PROVIDER_SITE_OTHER): Admitting: Certified Nurse Midwife

## 2024-06-20 ENCOUNTER — Encounter: Payer: Self-pay | Admitting: Certified Nurse Midwife

## 2024-06-20 DIAGNOSIS — Z3202 Encounter for pregnancy test, result negative: Secondary | ICD-10-CM

## 2024-06-20 DIAGNOSIS — Z3009 Encounter for other general counseling and advice on contraception: Secondary | ICD-10-CM | POA: Diagnosis not present

## 2024-06-20 LAB — POCT URINE PREGNANCY: Preg Test, Ur: NEGATIVE

## 2024-06-20 MED ORDER — NORETHIN ACE-ETH ESTRAD-FE 1-20 MG-MCG PO TABS: 1.0000 | ORAL_TABLET | Freq: Every day | ORAL | 3 refills | Status: DC

## 2024-06-20 NOTE — Patient Instructions (Signed)
 Preventive Care 59-23 Years Old Old, Female Preventive care refers to lifestyle choices and visits with your health care provider that can promote health and wellness. Preventive care visits are also called wellness exams. What can I expect for my preventive care visit? Counseling During your preventive care visit, your health care provider may ask about your: Medical history, including: Past medical problems. Family medical history. Pregnancy history. Current health, including: Menstrual cycle. Method of birth control. Emotional well-being. Home life and relationship well-being. Sexual activity and sexual health. Lifestyle, including: Alcohol, nicotine or tobacco, and drug use. Access to firearms. Diet, exercise, and sleep habits. Work and work Astronomer. Sunscreen use. Safety issues such as seatbelt and bike helmet use. Physical exam Your health care provider may check your: Height and weight. These may be used to calculate your BMI (body mass index). BMI is a measurement that tells if you are at a healthy weight. Waist circumference. This measures the distance around your waistline. This measurement also tells if you are at a healthy weight and may help predict your risk of certain diseases, such as type 2 diabetes and high blood pressure. Heart rate and blood pressure. Body temperature. Skin for abnormal spots. What immunizations do I need?  Vaccines are usually given at various ages, according to a schedule. Your health care provider will recommend vaccines for you based on your age, medical history, and lifestyle or other factors, such as travel or where you work. What tests do I need? Screening Your health care provider may recommend screening tests for certain conditions. This may include: Pelvic exam and Pap test. Lipid and cholesterol levels. Diabetes screening. This is done by checking your blood sugar (glucose) after you have not eaten for a while (fasting). Hepatitis  B test. Hepatitis C test. HIV (human immunodeficiency virus) test. STI (sexually transmitted infection) testing, if you are at risk. BRCA-related cancer screening. This may be done if you have a family history of breast, ovarian, tubal, or peritoneal cancers. Talk with your health care provider about your test results, treatment options, and if necessary, the need for more tests. Follow these instructions at home: Eating and drinking  Eat a healthy diet that includes fresh fruits and vegetables, whole grains, lean protein, and low-fat dairy products. Take vitamin and mineral supplements as recommended by your health care provider. Do not drink alcohol if: Your health care provider tells you not to drink. You are pregnant, may be pregnant, or are planning to become pregnant. If you drink alcohol: Limit how much you have to 0-1 drink a day. Know how much alcohol is in your drink. In the [place] one drink equals one 12 oz bottle of beer (355 mL), one 5 oz glass of wine (148 mL), or one 1 oz glass of hard liquor (44 mL). Lifestyle Brush your teeth every morning and night with fluoride toothpaste. Floss one time each day. Exercise for at least 30 minutes 5 or more days each week. Do not use any products that contain nicotine or tobacco. These products include cigarettes, chewing tobacco, and vaping devices, such as e-cigarettes. If you need help quitting, ask your health care provider. Do not use drugs. If you are sexually active, practice safe sex. Use a condom or other form of protection to prevent STIs. If you do not wish to become pregnant, use a form of birth control. If you plan to become pregnant, see your health care provider for a prepregnancy visit. Find healthy ways to manage stress, such as: Meditation,  yoga, or listening to music. Journaling. Talking to a trusted person. Spending time with friends and family. Minimize exposure to UV radiation to reduce your risk of skin  cancer. Safety Always wear your seat belt while driving or riding in a vehicle. Do not drive: If you have been drinking alcohol. Do not ride with someone who has been drinking. If you have been using any mind-altering substances or drugs. While texting. When you are tired or distracted. Wear a helmet and other protective equipment during sports activities. If you have firearms in your house, make sure you follow all gun safety procedures. Seek help if you have been physically or sexually abused. What's next? Go to your health care provider once a year for an annual wellness visit. Ask your health care provider how often you should have your eyes and teeth checked. Stay up to date on all vaccines. This information is not intended to replace advice given to you by your health care provider. Make sure you discuss any questions you have with your health care provider. Document Revised: 03/17/2021 Document Reviewed: 03/17/2021 Elsevier Patient Education  2024 [employer].

## 2024-07-03 ENCOUNTER — Encounter: Payer: Self-pay | Admitting: Certified Nurse Midwife

## 2024-07-06 NOTE — Progress Notes (Signed)
Had to be rescheduled

## 2024-07-09 ENCOUNTER — Encounter: Payer: Self-pay | Admitting: Certified Nurse Midwife

## 2024-07-09 ENCOUNTER — Ambulatory Visit (INDEPENDENT_AMBULATORY_CARE_PROVIDER_SITE_OTHER): Admitting: Certified Nurse Midwife

## 2024-07-09 VITALS — BP 118/76 | HR 76 | Wt 201.2 lb

## 2024-07-09 DIAGNOSIS — R198 Other specified symptoms and signs involving the digestive system and abdomen: Secondary | ICD-10-CM | POA: Diagnosis not present

## 2024-07-09 DIAGNOSIS — R140 Abdominal distension (gaseous): Secondary | ICD-10-CM | POA: Diagnosis not present

## 2024-07-09 NOTE — Progress Notes (Signed)
 GYN ENCOUNTER NOTE  Subjective:       Madison Boyer is a 23 y.o. G37P0101 female is here for gynecologic evaluation of the following issues:  1. Pt seen 9/18 for her 6 wk ppv . Returns today for work note. States she is having to go back to work a little earlier than planning and her work is requesting a note for her to return. She is requesting that she have lighter work load due to have abdominal weakness and discomfort from the pregnancy. She notes more significant with bending and lifting.     OB History  Gravida Para Term Preterm AB Living  1 1 0 1 0 1  SAB IAB Ectopic Multiple Live Births  0 0 0 0 1    # Outcome Date GA Lbr Len/2nd Weight Sex Type Anes PTL Lv  1 Preterm 05/08/24 [redacted]w[redacted]d  5 lb 9.2 oz (2.53 kg) M CS-LTranv EPI  LIV     Birth Comments: caput, cranial moulding    Past Medical History:  Diagnosis Date   Bipolar depression (HCC)    Schizophrenic disorder (HCC) 2021    Past Surgical History:  Procedure Laterality Date   APPENDECTOMY     CESAREAN SECTION N/A 05/08/2024   Procedure: CESAREAN DELIVERY;  Surgeon: Janit Alm Agent, MD;  Location: [facility] ORS;  Service: Obstetrics;  Laterality: N/A;    Current Outpatient Medications on File Prior to Visit  Medication Sig Dispense Refill   furosemide  (LASIX ) 20 MG tablet Take 1 tablet (20 mg total) by mouth daily for 4 days. 4 tablet 0   ibuprofen  (ADVIL ) 600 MG tablet Take 1 tablet (600 mg total) by mouth every 6 (six) hours. 30 tablet 0   norethindrone-ethinyl estradiol -FE (LOESTRIN FE) 1-20 MG-MCG tablet Take 1 tablet by mouth daily. 90 tablet 3   ondansetron  (ZOFRAN -ODT) 4 MG disintegrating tablet Take 1 tablet (4 mg total) by mouth every 8 (eight) hours as needed for nausea or vomiting. 20 tablet 0   potassium chloride  SA (KLOR-CON  M) 20 MEQ tablet Take 1 tablet (20 mEq total) by mouth daily. 4 tablet 0   Prenatal Vit-Fe Fumarate-FA (PRENATAL MULTIVITAMIN) TABS tablet Take 1 tablet by mouth daily at 12 noon. 90  tablet 3   No current facility-administered medications on file prior to visit.    No Known Allergies  Social History   Socioeconomic History   Marital status: Single    Spouse name: Not on file   Number of children: Not on file   Years of education: Not on file   Highest education level: Not on file  Occupational History   Not on file  Tobacco Use   Smoking status: Former    Types: E-cigarettes   Smokeless tobacco: Never   Tobacco comments:    Stop vaping  Vaping Use   Vaping status: Former  Substance and Sexual Activity   Alcohol use: Not Currently    Comment: occassionally   Drug use: Not Currently    Types: Marijuana   Sexual activity: Not Currently    Birth control/protection: None  Other Topics Concern   Not on file  Social History Narrative   ** Merged History Encounter **       Social Drivers of Health   Financial Resource Strain: Medium Risk (10/30/2023)   Overall Financial Resource Strain (CARDIA)    Difficulty of Paying Living Expenses: Somewhat hard  Food Insecurity: No Food Insecurity (05/07/2024)   Hunger Vital Sign    Worried About  Running Out of Food in the Last Year: Never true    Ran Out of Food in the Last Year: Never true  Transportation Needs: No Transportation Needs (05/07/2024)   PRAPARE - Administrator, Civil Service (Medical): No    Lack of Transportation (Non-Medical): No  Physical Activity: Insufficiently Active (10/30/2023)   Exercise Vital Sign    Days of Exercise per Week: 3 days    Minutes of Exercise per Session: 30 min  Stress: Stress Concern Present (10/30/2023)   [employer] of Occupational Health - Occupational Stress Questionnaire    Feeling of Stress : Very much  Social Connections: Unknown (10/30/2023)   Social Connection and Isolation Panel    Frequency of Communication with Friends and Family: Once a week    Frequency of Social Gatherings with Friends and Family: Patient unable to answer    Attends  Religious Services: More than 4 times per year    Active Member of [employer] or Organizations: No    Attends Banker Meetings: Never    Marital Status: Never married  Intimate Partner Violence: Not At Risk (05/07/2024)   Humiliation, Afraid, Rape, and Kick questionnaire    Fear of Current or Ex-Partner: No    Emotionally Abused: No    Physically Abused: No    Sexually Abused: No    Family History  Problem Relation Age of Onset   Healthy Mother    Healthy Father    Healthy Maternal Grandmother    Depression Maternal Grandfather    Diabetes Maternal Grandfather    Healthy Paternal Grandmother    Healthy Paternal Grandfather     The following portions of the patient's history were reviewed and updated as appropriate: allergies, current medications, past family history, past medical history, past social history, past surgical history and problem list.  Review of Systems Review of Systems - Negative except as mentioned in HPI Review of Systems - General ROS: negative for - chills, fatigue, fever, hot flashes, malaise or night sweats Hematological and Lymphatic ROS: negative for - bleeding problems or swollen lymph nodes Gastrointestinal ROS: negative for - abdominal pain, blood in stools, change in bowel habits and nausea/vomiting Musculoskeletal ROS: negative for - joint pain, muscle pain or muscular weakness. Abdominal muscle weakness for diastasis recti Genito-Urinary ROS: negative for - change in menstrual cycle, dysmenorrhea, dyspareunia, dysuria, genital discharge, genital ulcers, hematuria, incontinence, irregular/heavy menses, nocturia or pelvic pain  Objective:   BP 118/76   Pulse 76   Wt 201 lb 3.2 oz (91.3 kg)   LMP  (Within Weeks)   Breastfeeding Yes   BMI 36.80 kg/m  CONSTITUTIONAL: Well-developed, well-nourished female in no acute distress.  HENT:  Normocephalic, atraumatic.  NECK: Normal range of motion, supple, no masses.  Normal thyroid .  SKIN: Skin is  warm and dry. No rash noted. Not diaphoretic. No erythema. No pallor. NEUROLGIC: Alert and oriented to person, place, and time. PSYCHIATRIC: Normal mood and affect. Normal behavior. Normal judgment and thought content. CARDIOVASCULAR:Not Examined RESPIRATORY: Not Examined BREASTS: Not Examined ABDOMEN: Soft, non distended; Non tender.  No Organomegaly. Diastasis recti  PELVIC: not indicated MUSCULOSKELETAL: Normal range of motion. No tenderness.  No cyanosis, clubbing, or edema.     Assessment:   Diastasis recti  Abdominal distension , weakness   Plan:   Discussed exercises to improve abdominal strength. Recommend physical therapy given difficulty with discomfort with bending down. Orders placed. Encourage use of abdominal binder to help with discomfort.  Follow up prn.   Zelda Hummer, CNM

## 2024-09-02 ENCOUNTER — Telehealth: Admitting: Emergency Medicine

## 2024-09-02 DIAGNOSIS — Z9189 Other specified personal risk factors, not elsewhere classified: Secondary | ICD-10-CM | POA: Diagnosis not present

## 2024-09-02 NOTE — Patient Instructions (Signed)
 Madison Boyer, thank you for joining Jon CHRISTELLA Belt, NP for today's virtual visit.  While this provider is not your primary care provider (PCP), if your PCP is located in our provider database this encounter information will be shared with them immediately following your visit.   A [facility] MyChart account gives you access to today's visit and all your visits, tests, and labs performed at [facility]  click here if you don't have a [facility] MyChart account or go to [email or web address]  Consent: (Patient) Madison Boyer provided verbal consent for this virtual visit at the beginning of the encounter.  Current Medications:  Current Outpatient Medications:    furosemide  (LASIX ) 20 MG tablet, Take 1 tablet (20 mg total) by mouth daily for 4 days., Disp: 4 tablet, Rfl: 0   ibuprofen  (ADVIL ) 600 MG tablet, Take 1 tablet (600 mg total) by mouth every 6 (six) hours., Disp: 30 tablet, Rfl: 0   norethindrone-ethinyl estradiol -FE (LOESTRIN FE) 1-20 MG-MCG tablet, Take 1 tablet by mouth daily., Disp: 90 tablet, Rfl: 3   ondansetron  (ZOFRAN -ODT) 4 MG disintegrating tablet, Take 1 tablet (4 mg total) by mouth every 8 (eight) hours as needed for nausea or vomiting., Disp: 20 tablet, Rfl: 0   potassium chloride  SA (KLOR-CON  M) 20 MEQ tablet, Take 1 tablet (20 mEq total) by mouth daily., Disp: 4 tablet, Rfl: 0   Prenatal Vit-Fe Fumarate-FA (PRENATAL MULTIVITAMIN) TABS tablet, Take 1 tablet by mouth daily at 12 noon., Disp: 90 tablet, Rfl: 3   Medications ordered in this encounter:  No orders of the defined types were placed in this encounter.    *If you need refills on other medications prior to your next appointment, please contact your pharmacy*  Follow-Up: Call back or seek an in-person evaluation if the symptoms worsen or if the condition fails to improve as anticipated.  [facility] [phone]  Other Instructions  Encourage regular  feeding (on-demand), but avoid trying to completely empty the breasts. You can also try several comfortable positions to ensure that milk flows from each area of the breast. The [facility] website provides patient information on feeding techniques for a mother with a blocked duct. Avoid tight bras or clothing that constricts the breasts. Do not stop breastfeeding, since this could lead to engorgement and worsen the problem. Other measures that may help include cool compresses and very gentle manual massage, consisting of lightly sweeping the skin toward the armpit. Some women find warmth and massage toward the nipple to be helpful. Deep massage or use of vibrating devices should be avoided because they can increase inflammation and edema, thus exacerbating the problem. Analgesics such as ibuprofen  or acetaminophen  may decrease the discomfort and inflammation. Minimize use of breast pump. Pumping frequently and completely emptying the breast may contribute to hyperlactation and engorgement in some women. Excessive pump pressure or poor flange fit can cause trauma to the nipple or areolar complex. Gentle hand expression or gentle breast compression while pumping may be helpful.    If you have been instructed to have an in-person evaluation today at a local Urgent Care facility, please use the link below. It will take you to a list of all of our available  Urgent Cares, including address, phone number and hours of operation. Please do not delay care.   Urgent Cares  If you or a family member do not have a primary care provider, use the link below to schedule a visit  and establish care. When you choose a [facility] primary care physician or advanced practice provider, you gain a long-term partner in health. Find a Primary Care Provider  Learn more about [facility] in-office and virtual care options: [facility] - Get Care Now

## 2024-09-02 NOTE — Progress Notes (Signed)
 Virtual Visit Consent   Madison Boyer, you are scheduled for a virtual visit with a [facility] provider today. Just as with appointments in the office, your consent must be obtained to participate. Your consent will be active for this visit and any virtual visit you may have with one of our providers in the next 365 days. If you have a MyChart account, a copy of this consent can be sent to you electronically.  As this is a virtual visit, video technology does not allow for your provider to perform a traditional examination. This may limit your provider's ability to fully assess your condition. If your provider identifies any concerns that need to be evaluated in person or the need to arrange testing (such as labs, EKG, etc.), we will make arrangements to do so. Although advances in technology are sophisticated, we cannot ensure that it will always work on either your end or our end. If the connection with a video visit is poor, the visit may have to be switched to a telephone visit. With either a video or telephone visit, we are not always able to ensure that we have a secure connection.  By engaging in this virtual visit, you consent to the provision of healthcare and authorize for your insurance to be billed (if applicable) for the services provided during this visit. Depending on your insurance coverage, you may receive a charge related to this service.  I need to obtain your verbal consent now. Are you willing to proceed with your visit today? Madison Boyer has provided verbal consent on 09/02/2024 for a virtual visit (video or telephone). Jon CHRISTELLA Belt, NP  Date: 09/02/2024 12:30 PM   Virtual Visit via Video Note   I, Jon CHRISTELLA Belt, connected with  Madison Boyer  ([id number], [birth year]) on 09/02/24 at 12:15 PM EST by a video-enabled telemedicine application and verified that I am speaking with the correct person using two identifiers.  Location: Patient: Virtual  Visit Location Patient: Home Provider: Virtual Visit Location Provider: Home Office   I discussed the limitations of evaluation and management by telemedicine and the availability of in person appointments. The patient expressed understanding and agreed to proceed.    History of Present Illness: Madison Boyer is a 23 y.o. who identifies as a female who was assigned female at birth, and is being seen today for possible blocked milk duct. She wonders if she has mastitis R breast - in the last week, on and off, sometimes there is a spot that is  hard as a rock, and she can pump or using a vibrating massager to get it to go away, but then it comes back sometimes. No redness, warmth, or significant pain. No fever.    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   Overdose of antidepressant 03/22/2022   Cannabis abuse 03/25/2020   Schizoaffective disorder (HCC) 02/15/2020    Allergies: No Known Allergies Medications:  Current Outpatient Medications:    furosemide  (LASIX ) 20 MG tablet, Take 1 tablet (20 mg total) by mouth daily for 4 days., Disp: 4 tablet, Rfl: 0   ibuprofen  (ADVIL ) 600 MG tablet, Take 1 tablet (600 mg total) by mouth every 6 (six) hours., Disp: 30 tablet, Rfl: 0   norethindrone-ethinyl estradiol -FE (LOESTRIN FE) 1-20 MG-MCG tablet, Take  1 tablet by mouth daily., Disp: 90 tablet, Rfl: 3   ondansetron  (ZOFRAN -ODT) 4 MG disintegrating tablet, Take 1 tablet (4 mg total) by mouth every 8 (eight) hours as needed for nausea or vomiting., Disp: 20 tablet, Rfl: 0   potassium chloride  SA (KLOR-CON  M) 20 MEQ tablet, Take 1 tablet (20 mEq total) by mouth daily., Disp: 4 tablet, Rfl: 0   Prenatal Vit-Fe Fumarate-FA (PRENATAL MULTIVITAMIN) TABS tablet, Take 1 tablet by mouth  daily at 12 noon., Disp: 90 tablet, Rfl: 3  Observations/Objective: Patient is well-developed, well-nourished in no acute distress.  Resting comfortably  at home.  Head is normocephalic, atraumatic.  No labored breathing.  Speech is clear and coherent with logical content.  Patient is alert and oriented at baseline.    Assessment and Plan: 1. Breastfeeding problem (Primary)  From her description, it doesn't sound like mastitis, no need for antibiotics. I suspect clogged milk duct/narrowed duct. Discussed supportive care and reasons for seeking in person eval.   Follow Up Instructions: I discussed the assessment and treatment plan with the patient. The patient was provided an opportunity to ask questions and all were answered. The patient agreed with the plan and demonstrated an understanding of the instructions.  A copy of instructions were sent to the patient via MyChart unless otherwise noted below.   The patient was advised to call back or seek an in-person evaluation if the symptoms worsen or if the condition fails to improve as anticipated.    Jon CHRISTELLA Belt, NP

## 2024-09-19 ENCOUNTER — Encounter: Payer: Self-pay | Admitting: Physician Assistant

## 2024-09-19 ENCOUNTER — Ambulatory Visit: Admitting: Physician Assistant

## 2024-09-19 VITALS — BP 110/70 | HR 84 | Temp 98.0°F | Resp 16 | Ht 62.0 in | Wt 202.8 lb

## 2024-09-19 DIAGNOSIS — K5900 Constipation, unspecified: Secondary | ICD-10-CM

## 2024-09-19 NOTE — Progress Notes (Signed)
 [facility] [address] [place], [place] [address]  Internal MEDICINE  Office Visit Note  Patient Name: Madison Boyer  [id number]  [id number]  Date of Service: 09/19/2024  Chief Complaint  Patient presents with   Acute Visit   Constipation    Always needs a laxative and fiber in order to have consistent BM.     HPI Pt is here for a sick visit due to constipation -States in October started to have BM about 4 days per week in AM. States typically hard, then if she takes laxatives then will be diarrhea. Uses stool softener as well, has not tried this on its own--just in combo with laxative -states she had a c section back in August and was having BM 2x per day after this and felt great. She was given senokot at the time -Prior to pregnancy her BM were about 4 days per week then too so back to baseline -not taking metamucil and will try this first -sees OBGYN tomorrow for follow up due to breastfeeding concerns and recent mastitis  Current Medication:  Outpatient Encounter Medications as of 09/19/2024  Medication Sig   Prenatal Vit-Fe Fumarate-FA (PRENATAL MULTIVITAMIN) TABS tablet Take 1 tablet by mouth daily at 12 noon.   [DISCONTINUED] furosemide  (LASIX ) 20 MG tablet Take 1 tablet (20 mg total) by mouth daily for 4 days.   [DISCONTINUED] ibuprofen  (ADVIL ) 600 MG tablet Take 1 tablet (600 mg total) by mouth every 6 (six) hours.   [DISCONTINUED] norethindrone-ethinyl estradiol -FE (LOESTRIN FE) 1-20 MG-MCG tablet Take 1 tablet by mouth daily.   [DISCONTINUED] ondansetron  (ZOFRAN -ODT) 4 MG disintegrating tablet Take 1 tablet (4 mg total) by mouth every 8 (eight) hours as needed for nausea or vomiting.   [DISCONTINUED] potassium chloride  SA (KLOR-CON  M) 20 MEQ tablet Take 1 tablet (20 mEq total) by mouth daily.   No facility-administered encounter medications on file as of 09/19/2024.      Medical History: Past Medical History:  Diagnosis Date   Bipolar  depression (HCC)    Schizophrenic disorder (HCC) 2021     Vital Signs: BP 110/70   Pulse 84   Temp 98 F (36.7 C)   Resp 16   Ht 5' 2 (1.575 m)   Wt 202 lb 12.8 oz (92 kg)   SpO2 98%   BMI 37.09 kg/m    Review of Systems  Constitutional:  Negative for fatigue and fever.  HENT:  Negative for congestion, mouth sores and postnasal drip.   Respiratory:  Negative for cough.   Cardiovascular:  Negative for chest pain.  Gastrointestinal:  Positive for constipation. Negative for abdominal pain.  Genitourinary:  Negative for flank pain.  Psychiatric/Behavioral: Negative.      Physical Exam Vitals reviewed.  Constitutional:      General: She is not in acute distress.    Appearance: Normal appearance. She is obese. She is not ill-appearing.  HENT:     Head: Normocephalic and atraumatic.  Eyes:     Pupils: Pupils are equal, round, and reactive to light.  Cardiovascular:     Rate and Rhythm: Normal rate.     Heart sounds: Normal heart sounds. No murmur heard. Pulmonary:     Effort: Pulmonary effort is normal. No respiratory distress.  Abdominal:     Tenderness: There is no abdominal tenderness.  Neurological:     Mental Status: She is alert and oriented to person, place, and time.  Psychiatric:        Mood  and Affect: Mood normal.        Behavior: Behavior normal.       Assessment/Plan: 1. Constipation, unspecified constipation type (Primary) Seems to be back to baseline from before pregnancy, but pt relates it is more bothersome to her now after having more regular BM at delivery. Will start on daily metamucil to increase fiber and help regularity. May consider senokot in future if needed as she did well with this after delivery. Drink plenty of water.   General Counseling: Madison Boyer verbalizes understanding of the findings of todays visit and agrees with plan of treatment. I have discussed any further diagnostic evaluation that may be needed or ordered today. We also  reviewed her medications today. she has been encouraged to call the office with any questions or concerns that should arise related to todays visit.    Counseling:    No orders of the defined types were placed in this encounter.   No orders of the defined types were placed in this encounter.   Time spent:25 Minutes

## 2024-09-19 NOTE — Progress Notes (Unsigned)
° °  Patient ID: Madison Boyer, female   DOB: June 14, 2001, 23 y.o.   MRN: [id number]  Reason for Consult: No chief complaint on file.   Referred by Madison Fish, NP  Subjective:     HPI:  Madison Boyer is a 23 y.o. female ***  Past Medical History:  Diagnosis Date   Bipolar depression (HCC)    Schizophrenic disorder (HCC) 2021   Family History  Problem Relation Age of Onset   Healthy Mother    Healthy Father    Healthy Maternal Grandmother    Depression Maternal Grandfather    Diabetes Maternal Grandfather    Healthy Paternal Grandmother    Healthy Paternal Grandfather    Past Surgical History:  Procedure Laterality Date   APPENDECTOMY     CESAREAN SECTION N/A 05/08/2024   Procedure: CESAREAN DELIVERY;  Surgeon: Madison Alm Agent, MD;  Location: [facility] ORS;  Service: Obstetrics;  Laterality: N/A;    Short Social History:  Social History   Tobacco Use   Smoking status: Former    Types: E-cigarettes   Smokeless tobacco: Never   Tobacco comments:    Stop vaping  Substance Use Topics   Alcohol use: Not Currently    Comment: occassionally    Allergies[1]  Current Outpatient Medications  Medication Sig Dispense Refill   Prenatal Vit-Fe Fumarate-FA (PRENATAL MULTIVITAMIN) TABS tablet Take 1 tablet by mouth daily at 12 noon. 90 tablet 3   No current facility-administered medications for this visit.    REVIEW OF SYSTEMS      Objective:  Objective   There were no vitals filed for this visit. There is no height or weight on file to calculate BMI.  Physical Exam  Data: ***     Assessment/Plan:     ***     Madison Boyer CNM       [1] No Known Allergies

## 2024-09-20 ENCOUNTER — Ambulatory Visit: Admitting: Advanced Practice Midwife

## 2024-09-20 ENCOUNTER — Encounter: Payer: Self-pay | Admitting: Advanced Practice Midwife

## 2024-09-20 VITALS — BP 116/71 | HR 82 | Ht 62.0 in | Wt 200.8 lb

## 2024-09-20 DIAGNOSIS — N644 Mastodynia: Secondary | ICD-10-CM

## 2024-09-20 DIAGNOSIS — Z9189 Other specified personal risk factors, not elsewhere classified: Secondary | ICD-10-CM

## 2024-09-20 DIAGNOSIS — O9229 Other disorders of breast associated with pregnancy and the puerperium: Secondary | ICD-10-CM

## 2024-09-20 MED ORDER — VITAFOL-OB PO TABS: 1.0000 | ORAL_TABLET | Freq: Every day | ORAL | 3 refills | Status: AC

## 2024-09-22 ENCOUNTER — Encounter: Payer: Self-pay | Admitting: Advanced Practice Midwife

## 2024-09-22 NOTE — Patient Instructions (Signed)
 Breastfeeding Tips for a Good Latch The latch is how your baby's mouth attaches to your nipple to breastfeed. A good latch will help your baby to feed and grow well. It will also help establish your milk supply. Breastfeeding can be challenging, especially during the first few weeks after your baby is born. It is normal to have some problems when you start to breastfeed your new baby, even if you have breastfed before. Your baby may have trouble latching due to: Not being in a correct position on the breast. Conditions that affect your baby's mouth, tongue, or lips, such as tongue-tie or cleft lip. The shape of your nipples, such as nipples that are flat, turned in, or very large. Using a bottle or pacifier too early. Your baby being born early (prematurely). Small babies often have a weak suck reflex. Breasts becoming overfilled with milk (engorgement). Breasts that are too full can make a latch difficult. If this happens, express a little milk to help soften the breast. Work with a breastfeeding specialist (advertising copywriter) to find positions and strategies that will help your baby have a good latch. How does this affect me? A poor latch may cause you to have problems such as: Cracked or sore nipples. Engorged breasts. Plugged milk ducts. Low milk supply. Breast inflammation or infection. How does this affect my baby? A poor latch may cause your baby to not be able to feed well and have trouble gaining weight. What actions can I take to help my baby have a good latch? How to position your baby Find a comfortable place to sit or lie down. Your neck and back should be well supported. If you are seated, place a pillow or rolled-up blanket under your baby to bring your baby to the level of your breast. Make sure that your baby's belly is facing your belly. Try different positions to find one that works best for you and your baby. Massage To begin each breastfeeding session, you may find it  helpful to gently massage your breast. This may be helpful if your breasts feel full. With your fingertips, massage from your chest wall toward your nipple in a circular motion. This encourages milk flow. It may be helpful to continue this action during feeding. Position Position your breast for a good latch. Support your breast with four fingers underneath and your thumb above your nipple. Keep your fingers away from your nipple and your baby's mouth. How to help your baby latch Follow these steps to help your baby latch: Stroke your baby's lips gently with your finger or nipple. When your baby's mouth is open wide enough, quickly bring your baby to your breast and place your entire nipple, and as much of the areolaas possible, into your baby's mouth. The areola is the colored area around your nipple. Your baby's tongue should be between the lower gum and your breast. You should be able to see more areola above your baby's upper lip than below the lower lip. When your baby starts sucking, you will feel a gentle pull on your nipple, but you should not feel pain. Be patient. It is common for a baby to suck for about 2-3 minutes to start the flow of breast milk. Make sure that your baby's mouth is correctly positioned around your nipple. Your baby's lips should create a seal on your breast and be turned outward.  Follow these instructions at home: General instructions Look for the following signs that your baby has successfully latched on  to your nipple: The baby is quietly tugging or quietly sucking without causing you pain. You hear the baby swallow after every 3 or 4 sucks. You see muscle movement above and in front of the baby's ears while your baby is sucking. Know these signs that your baby has not successfully latched on to your nipple: The baby makes sucking sounds or smacking sounds while nursing. You have nipple pain. If your baby is not latched well, put your little finger between your  baby's gums and your nipple to break the seal. Then, help your baby latch again. Get help from a lactation consultant if you need it. Where to find more information Office on Women's Health Breastfeeding: [place].ca Centers for Disease Control and Prevention: tonerpromos.no [employer]: llli.org Contact a health care provider if: You are frustrated with breastfeeding. Your baby is frequently too sleepy to feed well or is crying and will not stop. You are worried your baby is not getting enough milk. Signs that your baby is not getting enough milk include: Your baby is not gaining weight or loses weight. Your baby is more than 105 week old and wetting fewer than 6 diapers in a 24-hour period. You have pain or discomfort in your breasts, such as: Continued pain with breastfeeding. Breast engorgement that does not improve after 48-72 hours. Warm, red areas or painful areas on your breasts. Cracking or soreness in your nipples that does not get better with treatment. Bleeding or pus-like fluid coming from your nipples. You have a fever. This information is not intended to replace advice given to you by your health care provider. Make sure you discuss any questions you have with your health care provider. Document Revised: 11/02/2022 Document Reviewed: 10/06/2022 Elsevier Patient Education  2024 Elsevier Inc.Breastfeeding and Cracked or Sore Nipples Breastfeeding can be challenging, especially during the first few weeks after your baby is born. It is normal to have some tenderness in your nipples when you start to breastfeed your new baby, even if you have breastfed before. Cracked or sore nipples can happen if your baby's mouth does not attach the right way to your nipple to breastfeed (poor latch). Soreness can also happen if your baby is not positioned properly at your breast. Work with your health care provider or breastfeeding specialist (advertising copywriter) to find strategies that work  best for you. If you need help, ask for help as soon as possible. How does this affect me? Cracked or sore nipples can be painful. If breastfeeding is too painful or your baby has a poor latch, you may have problems completely emptying your breasts during a feeding. This may lead to problems such as overfilling of your breasts with milk (engorgement) or infection. If your baby has problems emptying milk from your breasts, this can also lessen your milk supply. How does this affect my baby? If a poor latch is the cause of cracked or sore nipples, your baby may have problems getting enough breast milk during a feeding. By taking steps to avoid sore or cracked nipples, you can help your baby feed effectively and gain weight as they should. Follow these instructions at home: Breastfeeding strategy  Always make sure that your baby latches and is in a correct position. Try different breastfeeding positions to find one that works the best for you and your baby. Breastfeed when you feel the need to reduce the fullness of your breasts or when your baby shows signs of hunger. This is called breastfeeding on  demand. Start breastfeeding by having your baby feed from the less sore breast first. Then after your milk has let down, switch your baby to the other breast. Make sure you break your baby's latch before removing your baby from the breast. To do this, put your little finger between your nipple and your baby's gums. If you use a breast pump: Make sure the flange fits properly over your nipple. A poor-fitting flange can cause nipple damage. Start by setting the pump to a low setting, and gradually increase the pump strength as needed. Too high of a setting may cause damage to your nipple. Breast care Wear a nursing bra that provides support. Avoid wearing: Tight clothes. Underwire bras. Ben that put pressure on your breasts. Make sure that your breasts stay moisturized and healthy. This will help  prevent cracking and relieve soreness. To do this: Avoid using soap on your nipples. Air-dry your nipples for 3-4 minutes after each feeding. Do not use things like a hair dryer to dry breasts. Use cotton or disposable bra pads to absorb breast milk that leaks. Be sure to change the pads if they become soaked with milk. Put lanolin on your nipples to moisturize the skin after feeding. Lanolin is safe for babies and does not need to be washed off before the next feeding. Rub some breast milk into your nipples: Use your hand to squeeze out a few drops of breast milk (hand express). Gently massage the milk into your nipples. Let your nipples air-dry. Use pads that have gel in them (hydrogel pads) between feedings to provide cooling relief and help heal the skin. Where to find more information [employer]: llli.org Contact a health care provider if: You are frustrated with breastfeeding. Your baby is frequently too sleepy to feed well or crying and will not stop. You are worried your baby is not getting enough milk. Signs of not getting enough milk include: Your baby is not gaining weight or loses weight. Your baby is more than 38 week old and wetting fewer than 6 diapers in a 24-hour period. You have pain or discomfort in your breasts, such as: Continued pain with breastfeeding. Breast engorgement that does not improve after 48-72 hours. Warm, red areas or painful areas on your breasts. Cracking and soreness in your nipples that does not get better with treatment. Bleeding or pus-like fluid coming from your nipples. You have a fever. This information is not intended to replace advice given to you by your health care provider. Make sure you discuss any questions you have with your health care provider. Document Revised: 10/07/2022 Document Reviewed: 10/06/2022 Elsevier Patient Education  2024 Elsevier Inc.Breastfeeding and Breast Care Breastfeeding can be challenging,  especially during the first few weeks after your baby is born. It is normal to have some problems when you start to breastfeed your new baby, even if you have breastfed before. There are things that you can do to take care of yourself and help prevent common breastfeeding problems. Work with your health care provider or breastfeeding specialist (advertising copywriter) to find strategies that work best for you. If you need help, ask for help as soon as possible to prevent problems later on. How does breast care during breastfeeding affect me? Keeping your breasts healthy and making sure that your baby attaches (latches) to your nipples well are important parts of a good breastfeeding experience. A good latch and frequent feeding will help you avoid common problems, such as: Cracked or sore  nipples. Breasts becoming too full of milk (engorgement). Plugged milk ducts. Low milk supply. Breast inflammation or infection. How does breast care during breastfeeding affect my baby? By taking steps to avoid breastfeeding problems, you will help your baby feed well and gain weight as they should. If a poor latch causes cracked or sore nipples, your baby may have problems emptying milk from your breasts. This can lower your milk supply. What actions can I take to care for myself during breastfeeding? Breastfeeding tips Always make sure that your baby latches and is in a good position. Try different breastfeeding positions to find one that works best for you and your baby. If your baby is not latched well, put your little finger between your baby's gums and your nipple to break the seal. Then, help your baby latch again. Breastfeed when you feel the need to make your breasts less full or when your baby shows signs of hunger. This is called breastfeeding on demand. Try to relax when it is time to feed your baby. This helps to trigger your let-down reflex, which releases milk from your breast. To help increase milk  flow: Pump or hand express a small amount of breast milk right before breastfeeding to soften your breast, areola, and nipple. Apply warm, moist heat to your breast right before feeding to help milk flow. You can do this in the shower or with hand towels soaked with warm water. Massage your breast right before or during feeding to increase blood flow and help milk flow. Breast care  Wear a nursing bra that provides support. Avoid wearing: Tight clothes. Underwire bras. Ben that put pressure on your breasts. Make sure that your breasts stay moisturized and healthy. This will help prevent cracking and relieve soreness. Avoid using soap on your nipples. Air-dry your nipples for 3-4 minutes after each feeding. Do not use things like a hair dryer to dry breasts. Use cotton or disposable nursing pads to absorb breast milk leaks. Be sure to change the pads if they get soaked with milk. Put lanolin on your nipples to moisturize the skin after feeding. Lanolin is safe for babies and does not need to be washed off before the next feeding. Rub some breast milk into your nipples. Use your hand to squeeze out a few drops of breast milk (hand express). Gently massage the milk into your nipples. Let your nipples air-dry. Use pads that have gel in them (hydrogel pads) between feedings to provide cooling relief and help heal the skin. If told, put ice on the affected area. Put ice in a plastic bag. Place a towel between your skin and the bag. Leave the ice on for 20 minutes, 2-3 times a day. If your skin turns bright red, remove the ice right away to prevent skin damage. The risk of damage is higher if you cannot feel pain, heat, or cold. Follow these instructions at home: Eating and drinking Eat a healthy diet that includes many kinds of foods such as healthy fats, lean proteins, whole grains, fruits and vegetables. Drink enough fluid to keep your pee (urine) pale yellow. General instructions Talk to  your provider or lactation consultant before taking over-the-counter or prescription medicines, vitamins, herbs, and supplements. Rest as much as possible. Nap while your baby is sleeping. Contact a health care provider if: You become frustrated with breastfeeding. Your baby is often too sleepy to feed well or is crying and will not stop. You are worried your baby is not getting enough  milk. Signs of not getting enough milk include: Your baby is not gaining weight or loses weight. Your baby is more than 19 week old and wetting fewer than 6 diapers in a 24-hour period. You have pain or discomfort in your breasts, such as: Continued pain with breastfeeding. Breast engorgement that does not improve after 48-72 hours. Warm, red areas or painful areas on your breasts. Cracking or soreness in your nipples that does not get better with treatment. Bleeding or pus-like fluid coming from your nipples. You have a fever. This information is not intended to replace advice given to you by your health care provider. Make sure you discuss any questions you have with your health care provider. Document Revised: 11/17/2022 Document Reviewed: 10/06/2022 Elsevier Patient Education  2024 [employer].

## 2024-09-25 ENCOUNTER — Telehealth: Payer: Self-pay | Admitting: Lactation Services

## 2024-09-25 NOTE — Telephone Encounter (Signed)
 Lactation department at [facility] reviewed an outpatient referral for Madison Boyer in regards to being at risk for breastfeeding difficulties.  Ms. Cena stated that she is barely getting 1 ounce of milk from one breast and the other breast she is pumping 5 - 6oz.  Infant is receiving both breastmilk/formula.  She is latching infant about 2x a day and pumping 3x a day.  The pump she is using is a Zoomie and her Medela Hands Tishia Maestre pump when she is at work.   The stated that she thought she had mastitis at Thanksgiving but didn't call anyone because she had guest at home.  After she pumps on the breast that does have a large output the breast is still tender and sore.  Mom stated she has tried ice, heat, and is currently taking sunflower lecithin.   Mom also showed concerned w/ the amt of weight she has gained and wonders if she should stop nursing/pumping.  She mentioned that she stays hungry as well.  LC explained how breastfeeding can typically burn calories causing moms to be hungry.  LC recommended that she reach out to a nutritionist to assist in this area.  LC was able to set up an outpatient appointment for Wednesday, December 31st at 1:00p.  In the meantime, LC recommended that mom reframe from any use of heat.  Try applying cold compress on the breast, prior to a breastfeeding session and after for about 15 minutes.  LC explained input/output with mom and stated that she should be pumping 8x a day.  Recommended to increase pumping session and only pump for 15 - 20 minutes.  LC recommended trying to do some breast gymnastics when she pumps and described to her what it was.  Mom verbalized understanding.

## 2024-10-02 ENCOUNTER — Ambulatory Visit
Admission: RE | Admit: 2024-10-02 | Discharge: 2024-10-02 | Disposition: A | Discharge status: Home / Self Care | Source: Ambulatory Visit | Attending: Advanced Practice Midwife | Admitting: Advanced Practice Midwife

## 2024-10-02 DIAGNOSIS — O9270 Unspecified disorders of lactation: Secondary | ICD-10-CM | POA: Diagnosis present

## 2024-10-02 NOTE — Lactation Note (Addendum)
 Lactation Consultation Note  Patient Name: Madison Boyer Unijb'd Date: 10/02/2024 Age:23 y.o. Reason for consult: Follow-up assessment;Other (Comment) (Mom with low milk supply.)   Maternal Data Patient is a first time mom, delivered a baby boy at 20 1/7 weeks by C/S. Madison Boyer is now 53 months old. Patient requested LC assistance r/t low milk supply especially of her right breast post mastitis. Per patient she had mastitis right before the Thanksgiving holiday. She reports she currently pumps 2 times in a 24 hour period and breastfeed 2 times in a 24 hour period. Patient is unclear how much she expresses in a 24 hour period. She reports if she does not breastfeed in the morning she does get 4-5 ounces but since she had mastitis she is only expresses 3 ml's or less from the right breast. Per her report she isn't sure if her breast flanges fit correctly or if she is using her personal pumps correctly. Patient is here today for assistance with pumping as she is returning to school soon in addition to working. She is not requesting breastfeeding help at this time. Per her report he grandmother helps with baby care. Baby receives any pumped milk and the formula supplemental feeds. Patient would like to continue with providing milk as long as possible and increase her supply as best she can.  Has patient been taught Hand Expression?: Yes Does the patient have breastfeeding experience prior to this delivery?: No  Feeding Mother's Current Feeding Choice: Breast Milk and Donor Milk   Lactation Tools Discussed/Used Tools: Pump Breast pump type: Double-Electric Breast Pump;Manual Pump Education: Setup, frequency, and cleaning;Other (comment) (breast shield sizing) Reason for Pumping: Breastfeeding Mom returning to school in addition to her working  and is here today to discuss low milk supply and correct breast flange fitting and personal pump use. Pumping frequency: Goal to maximize milk production   is 8 pump/breastfeeding sessions in 24 hours. Pumped volume: 59 mL  Interventions Interventions: DEBP;Education Patient was resized for correct fit of breast flanges and patient reported this was most breastmilk she has gotten from the right breast and overall made more with pumping with flange change and correct use of DEBP. Discussed with patient at 4 months post partum she most likely will not make a full milk supply(25 ounces/24 hrs) however increasing stimulation and emptying can assist with increasing her current milk supply as she is only pumping 2 times/24 hrs. Reviewed instructions with patient who verbalized understanding of information. -Goal to increase pumping sessions to the best of your ability.  -As you are now pumping 2 times per day and breastfeeding 2 times per day any extra pump sessions can help.  -To maximize your milk production consider to make a goal of pumping and/or breastfeed as close to 8 times in 24 hours.  -Once you increase your pumping/breastfeeding sessions it will take 72 hours from when you started to note more milk. -When pumping remember to start off your pump sessions using the stimulation phase(faster rate) and once milk starts flowing press the button to change to the expression phase(the slower rate) Increase the suction using the + sign . Increase 1 level at a time and if the suction strength is too strong it will feel pinchy or painful. If you are experiencing discomfort turn it down by 1 and see if it is now a strong tug but comfortable. If it is not decrease again using the - sign until it feels comfortable. Following your body's comfort allows you  to find your maximum comfort vacuum. -When pumping if the milk slows down you can press the arrow button with milk drops on the Medela pump or the M button on the Zomie pump to put the pump back in the stimulation mode. Once the milk starts flowing again with another let-down of milk press the button again to  set the pump back into the expression mode.You can do this 2-3 times each pump[ing to increase stimulation and emptying. -With today's measurement you can use the 21 mm breast flanges on the right breast and see if you have a 19 or 20 mm breast flange to use on the left breast. If you do not have a 19 or 20 mm you can use the 21 and see if that is emptying your breast and if it is comfortable. If it is not emptying you properly or is not comfortable you can try the 18 mm on the left breast to see if that works better. -Remember to increase your fluid intake and drink to thirst.   Discharge Discharge Education: Other (comment) (Tips and strategies to maximize milk production, corect use of personal pumps,  correct breast flange sizing.) Pump: Personal;Hands Free;Manual (Mom has a Medela wearable pump and a Zomie breastpump.  Harmony manual pump provided today.) Patient understands to call back to [facility] lactation as needed.  Consult Status Consult Status: Complete   [name] 10/02/2024, 3:54 PM

## 2024-10-02 NOTE — Discharge Instructions (Signed)
-  Goal to increase pumping sessions to the best of your ability.  -As you are now pumping 2 times per day and breastfeeding 2 times per day any extra pump sessions can help.  -To maximize your milk production consider to make a goal of pumping and/or breastfeed as close to 8 times in 24 hours.  -Once you increase your pumping/breastfeeding sessions it will take 72 hours from when you started to note more milk. -When pumping remember to start off your pump sessions using the stimulation phase(faster rate) and once milk starts flowing press the button to change to the expression phase(the slower rate) Increase the suction using the + sign . Increase 1 level at a time and if the suction strength is too strong it will feel pinchy or painful. If you are experiencing discomfort turn it down by 1 and see if it is now a strong tug but comfortable. If it is not decrease again using the - sign until it feels comfortable. Following your body's comfort allows you to find your maximum comfort vacuum. -When pumping if the milk slows down you can press the arrow button with milk drops on the Medela pump or the M button on the Zomie pump to put the pump back in the stimulation mode. Once the milk starts flowing again with another let-down of milk press the button again to set the pump back into the expression mode.You can do this 2-3 times each pump[ing to increase stimulation and emptying. -With today's measurement you can use the 21 mm breast flanges on the right breast and see if you have a 19 or 20 mm breast flange to use on the left breast. If you do not have a 19 or 20 mm you can use the 21 and see if that is emptying your breast and if it is comfortable. If it is not emptying you properly or is not comfortable you can try the 18 mm on the left breast to see if that works better. -Remember to increase your fluid intake and drink to thirst.

## 2024-10-10 ENCOUNTER — Ambulatory Visit: Admitting: Nurse Practitioner

## 2024-10-10 ENCOUNTER — Telehealth: Payer: Self-pay | Admitting: Nurse Practitioner

## 2024-10-10 ENCOUNTER — Encounter: Payer: Self-pay | Admitting: Nurse Practitioner

## 2024-10-10 VITALS — BP 110/72 | HR 76 | Temp 98.1°F | Resp 16 | Ht 62.0 in | Wt 203.6 lb

## 2024-10-10 DIAGNOSIS — E6609 Other obesity due to excess calories: Secondary | ICD-10-CM | POA: Diagnosis not present

## 2024-10-10 DIAGNOSIS — Z0001 Encounter for general adult medical examination with abnormal findings: Secondary | ICD-10-CM | POA: Diagnosis not present

## 2024-10-10 DIAGNOSIS — K5900 Constipation, unspecified: Secondary | ICD-10-CM | POA: Diagnosis not present

## 2024-10-10 DIAGNOSIS — Z6837 Body mass index (BMI) 37.0-37.9, adult: Secondary | ICD-10-CM

## 2024-10-10 DIAGNOSIS — E66812 Obesity, class 2: Secondary | ICD-10-CM | POA: Diagnosis not present

## 2024-10-10 DIAGNOSIS — Z789 Other specified health status: Secondary | ICD-10-CM | POA: Diagnosis not present

## 2024-10-10 NOTE — Telephone Encounter (Signed)
Lvm to schedule 6 month follow up-Toni

## 2024-10-10 NOTE — Progress Notes (Signed)
 [facility] [address] [place], [place] [address]  Internal MEDICINE  Office Visit Note  Patient Name: Madison Boyer  [id number]  [id number]  Date of Service: 10/10/2024  Chief Complaint  Patient presents with   Annual Exam    HPI Madison Boyer presents for an annual well visit and physical exam.  Well-appearing 24 y.o. female with no major medical problems.  She delivered her first child via C-section about 4 months ago. She is in good spirits. She is breastfeeding her infant which she does during the office visit. She is not havign any issues with breast feeding.  Pap smear: done last year by OBGYN Labs: deferred. Up to date for now.  New or worsening pain: some pain from recent mastitis and engorgement but this has been improving.  Other concerns: none She has lost about 22 lbs since giving birth and she is continuing to lose weight slowly. Reports that any depression she had resolved once she delivered her son and she has been enjoying bonding with him and taking care of him every day.    Current Medication: Outpatient Encounter Medications as of 10/10/2024  Medication Sig   Prenatal Vit-Fe Fumarate-FA (VITAFOL -OB) TABS Take 1 tablet by mouth daily.   No facility-administered encounter medications on file as of 10/10/2024.    Surgical History: Past Surgical History:  Procedure Laterality Date   APPENDECTOMY     CESAREAN SECTION N/A 05/08/2024   Procedure: CESAREAN DELIVERY;  Surgeon: Janit Alm Agent, MD;  Location: [facility] ORS;  Service: Obstetrics;  Laterality: N/A;    Medical History: Past Medical History:  Diagnosis Date   Bipolar depression (HCC)    Schizophrenic disorder (HCC) 2021    Family History: Family History  Problem Relation Age of Onset   Healthy Mother    Healthy Father    Healthy Maternal Grandmother    Depression Maternal Grandfather    Diabetes Maternal Grandfather    Healthy Paternal Grandmother    Healthy Paternal  Grandfather     Social History   Socioeconomic History   Marital status: Single    Spouse name: Not on file   Number of children: Not on file   Years of education: Not on file   Highest education level: Not on file  Occupational History   Not on file  Tobacco Use   Smoking status: Former    Types: E-cigarettes   Smokeless tobacco: Never   Tobacco comments:    Stop vaping  Vaping Use   Vaping status: Former  Substance and Sexual Activity   Alcohol use: Not Currently    Comment: occassionally   Drug use: Not Currently    Types: Marijuana   Sexual activity: Not Currently    Birth control/protection: None  Other Topics Concern   Not on file  Social History Narrative   ** Merged History Encounter **       Social Drivers of Health   Tobacco Use: Medium Risk (10/10/2024)   Patient History    Smoking Tobacco Use: Former    Smokeless Tobacco Use: Never    Passive Exposure: Not on file  Financial Resource Strain: Medium Risk (10/30/2023)   Overall Financial Resource Strain (CARDIA)    Difficulty of Paying Living Expenses: Somewhat hard  Food Insecurity: No Food Insecurity (05/07/2024)   Epic    Worried About Radiation Protection Practitioner of Food in the Last Year: Never true    Ran Out of Food in the Last Year: Never true  Transportation Needs:  No Transportation Needs (05/07/2024)   Epic    Lack of Transportation (Medical): No    Lack of Transportation (Non-Medical): No  Physical Activity: Insufficiently Active (10/30/2023)   Exercise Vital Sign    Days of Exercise per Week: 3 days    Minutes of Exercise per Session: 30 min  Stress: Stress Concern Present (10/30/2023)   [employer] of Occupational Health - Occupational Stress Questionnaire    Feeling of Stress : Very much  Social Connections: Unknown (10/30/2023)   Social Connection and Isolation Panel    Frequency of Communication with Friends and Family: Once a week    Frequency of Social Gatherings with Friends and Family: Patient  unable to answer    Attends Religious Services: More than 4 times per year    Active Member of Clubs or Organizations: No    Attends Banker Meetings: Never    Marital Status: Never married  Intimate Partner Violence: Not At Risk (05/07/2024)   Epic    Fear of Current or Ex-Partner: No    Emotionally Abused: No    Physically Abused: No    Sexually Abused: No  Depression (PHQ2-9): Low Risk (10/10/2024)   Depression (PHQ2-9)    PHQ-2 Score: 0  Alcohol Screen: Low Risk (10/30/2023)   Alcohol Screen    Last Alcohol Screening Score (AUDIT): 0  Housing: High Risk (05/07/2024)   Epic    Unable to Pay for Housing in the Last Year: No    Number of Times Moved in the Last Year: 2    Homeless in the Last Year: No  Utilities: Not At Risk (05/07/2024)   Epic    Threatened with loss of utilities: No  Health Literacy: Adequate Health Literacy (10/30/2023)   B1300 Health Literacy    Frequency of need for help with medical instructions: Rarely      Review of Systems  Constitutional:  Negative for activity change, appetite change, chills, fatigue, fever and unexpected weight change.  HENT: Negative.  Negative for congestion, ear pain, rhinorrhea, sore throat and trouble swallowing.   Eyes: Negative.   Respiratory: Negative.  Negative for cough, chest tightness, shortness of breath and wheezing.   Cardiovascular: Negative.  Negative for chest pain.  Gastrointestinal: Negative.  Negative for abdominal pain, blood in stool, constipation, diarrhea, nausea and vomiting.  Endocrine: Negative.   Genitourinary: Negative.  Negative for difficulty urinating, dysuria, frequency, hematuria and urgency.  Musculoskeletal: Negative.  Negative for arthralgias, back pain, joint swelling, myalgias and neck pain.  Skin:  Positive for rash. Negative for wound.  Allergic/Immunologic: Negative.  Negative for immunocompromised state.  Neurological: Negative.  Negative for dizziness, seizures, numbness and  headaches.  Hematological: Negative.   Psychiatric/Behavioral: Negative.  Negative for behavioral problems, self-injury and suicidal ideas. The patient is not nervous/anxious.     Vital Signs: BP 110/72   Pulse 76   Temp 98.1 F (36.7 C)   Resp 16   Ht 5' 2 (1.575 m)   Wt 203 lb 9.6 oz (92.4 kg)   SpO2 98%   BMI 37.24 kg/m    Physical Exam Vitals reviewed.  Constitutional:      General: She is not in acute distress.    Appearance: Normal appearance. She is well-developed. She is obese. She is not ill-appearing or diaphoretic.  HENT:     Head: Normocephalic and atraumatic.     Right Ear: Tympanic membrane, ear canal and external ear normal.     Left Ear: Tympanic membrane,  ear canal and external ear normal.     Nose: Nose normal. No congestion or rhinorrhea.     Mouth/Throat:     Pharynx: No oropharyngeal exudate.  Eyes:     General: No scleral icterus.       Right eye: No discharge.        Left eye: No discharge.     Extraocular Movements: Extraocular movements intact.     Conjunctiva/sclera: Conjunctivae normal.     Pupils: Pupils are equal, round, and reactive to light.  Neck:     Thyroid : No thyromegaly.     Vascular: No JVD.     Trachea: No tracheal deviation.  Cardiovascular:     Rate and Rhythm: Normal rate and regular rhythm.     Pulses: Normal pulses.     Heart sounds: Normal heart sounds. No murmur heard.    No friction rub. No gallop.  Pulmonary:     Effort: Pulmonary effort is normal. No respiratory distress.     Breath sounds: Normal breath sounds. No stridor. No wheezing or rales.  Chest:     Chest wall: No tenderness.  Breasts:    Breasts are symmetrical.     Right: Normal. No swelling, bleeding, inverted nipple, mass, nipple discharge, skin change or tenderness.     Left: Normal. No swelling, bleeding, inverted nipple, mass, nipple discharge, skin change or tenderness.  Abdominal:     General: Bowel sounds are normal. There is no distension.      Palpations: Abdomen is soft. There is no mass.     Tenderness: There is no abdominal tenderness. There is no guarding or rebound.     Hernia: There is no hernia in the left inguinal area or right inguinal area.  Genitourinary:    General: Normal vulva.     Exam position: Lithotomy position.     Pubic Area: No rash or pubic lice.      Labia:        Right: No rash, tenderness, lesion or injury.        Left: No rash, tenderness, lesion or injury.      Urethra: No prolapse, urethral pain or urethral swelling.     Vagina: No signs of injury and foreign body. No vaginal discharge, erythema, tenderness, bleeding, lesions or prolapsed vaginal walls.     Cervix: Friability, erythema and cervical bleeding present. No cervical motion tenderness, discharge, lesion or eversion.     Uterus: Normal. Not deviated, not enlarged, not fixed, not tender and no uterine prolapse.      Adnexa: Right adnexa normal and left adnexa normal.       Right: No mass, tenderness or fullness.         Left: No mass, tenderness or fullness.    Musculoskeletal:        General: No tenderness or deformity. Normal range of motion.     Cervical back: Normal range of motion and neck supple.  Lymphadenopathy:     Cervical: No cervical adenopathy.     Upper Body:     Right upper body: No supraclavicular, axillary or pectoral adenopathy.     Left upper body: No supraclavicular, axillary or pectoral adenopathy.     Lower Body: No right inguinal adenopathy. No left inguinal adenopathy.  Skin:    General: Skin is warm and dry.     Capillary Refill: Capillary refill takes less than 2 seconds.     Coloration: Skin is not pale.     Findings:  No erythema or rash.  Neurological:     Mental Status: She is alert and oriented to person, place, and time.     Cranial Nerves: No cranial nerve deficit.     Motor: No abnormal muscle tone.     Coordination: Coordination normal.     Deep Tendon Reflexes: Reflexes are normal and symmetric.   Psychiatric:        Mood and Affect: Mood normal.        Behavior: Behavior normal.        Thought Content: Thought content normal.        Judgment: Judgment normal.        Assessment/Plan: 1. Encounter for routine adult health examination with abnormal findings Age-appropriate preventive screenings and vaccinations discussed, annual physical exam completed. Routine labs for health maintenance deferred. PHM updated.    2. Constipation, unspecified constipation type Tolerable and mild. Discussed adding fiber supplement if having issues   3. Class 2 obesity due to excess calories without serious comorbidity with body mass index (BMI) of 37.0 to 37.9 in adult (Primary) Continue breastfeeding if desired. Increase physical activity as tolerated. Do not decrease calories while breastfeeding.  4. Exclusive breastfeeding by mother No issues, engorgement is improving      General Counseling: Latorria verbalizes understanding of the findings of todays visit and agrees with plan of treatment. I have discussed any further diagnostic evaluation that may be needed or ordered today. We also reviewed her medications today. she has been encouraged to call the office with any questions or concerns that should arise related to todays visit.    No orders of the defined types were placed in this encounter.   No orders of the defined types were placed in this encounter.   Return in about 6 months (around 04/09/2025) for F/U, Madison Boyer PCP.   Total time spent:30 Minutes Time spent includes review of chart, medications, test results, and follow up plan with the patient.   [place] Controlled Substance Database was reviewed by me.  This patient was seen by Mardy Maxin, FNP-C in collaboration with Dr. Sigrid Bathe as a part of collaborative care agreement.  Zayveon Raschke R. Maxin, MSN, FNP-C Internal medicine

## 2024-10-22 ENCOUNTER — Ambulatory Visit (LOCAL_COMMUNITY_HEALTH_CENTER)

## 2024-10-22 DIAGNOSIS — Z111 Encounter for screening for respiratory tuberculosis: Secondary | ICD-10-CM

## 2024-10-25 ENCOUNTER — Ambulatory Visit

## 2024-10-25 ENCOUNTER — Ambulatory Visit: Payer: Self-pay | Admitting: Family Medicine

## 2024-10-25 DIAGNOSIS — Z111 Encounter for screening for respiratory tuberculosis: Secondary | ICD-10-CM

## 2024-10-25 LAB — TB SKIN TEST
Induration: 0 mm
TB Skin Test: NEGATIVE

## 2024-11-08 ENCOUNTER — Other Ambulatory Visit: Payer: Self-pay | Admitting: Nurse Practitioner

## 2024-11-08 ENCOUNTER — Ambulatory Visit: Payer: Self-pay

## 2024-11-08 DIAGNOSIS — Z111 Encounter for screening for respiratory tuberculosis: Secondary | ICD-10-CM

## 2025-04-09 ENCOUNTER — Ambulatory Visit: Admitting: Nurse Practitioner

## 2025-06-10 ENCOUNTER — Encounter: Admitting: Nurse Practitioner

## 2025-10-13 ENCOUNTER — Encounter: Admitting: Nurse Practitioner
